# Patient Record
Sex: Female | Born: 1965
Health system: Southern US, Community
[De-identification: ages and names within clinical notes are randomized; demographics above are authoritative.]

## PROBLEM LIST (undated history)

## (undated) DIAGNOSIS — M5126 Other intervertebral disc displacement, lumbar region: Secondary | ICD-10-CM

## (undated) DIAGNOSIS — E785 Hyperlipidemia, unspecified: Secondary | ICD-10-CM

## (undated) DIAGNOSIS — M51369 Other intervertebral disc degeneration, lumbar region without mention of lumbar back pain or lower extremity pain: Secondary | ICD-10-CM

## (undated) DIAGNOSIS — IMO0002 Reserved for concepts with insufficient information to code with codable children: Secondary | ICD-10-CM

## (undated) DIAGNOSIS — L732 Hidradenitis suppurativa: Secondary | ICD-10-CM

## (undated) DIAGNOSIS — M5136 Other intervertebral disc degeneration, lumbar region: Secondary | ICD-10-CM

## (undated) DIAGNOSIS — R87619 Unspecified abnormal cytological findings in specimens from cervix uteri: Secondary | ICD-10-CM

## (undated) DIAGNOSIS — N979 Female infertility, unspecified: Secondary | ICD-10-CM

## (undated) DIAGNOSIS — G43909 Migraine, unspecified, not intractable, without status migrainosus: Secondary | ICD-10-CM

## (undated) HISTORY — DX: Unspecified abnormal cytological findings in specimens from cervix uteri: R87.619

## (undated) HISTORY — DX: Other intervertebral disc displacement, lumbar region: M51.26

## (undated) HISTORY — PX: COLPOSCOPY: SHX161

## (undated) HISTORY — DX: Hyperlipidemia, unspecified: E78.5

## (undated) HISTORY — PX: ANKLE SURGERY: SHX546

## (undated) HISTORY — DX: Reserved for concepts with insufficient information to code with codable children: IMO0002

## (undated) HISTORY — DX: Migraine, unspecified, not intractable, without status migrainosus: G43.909

## (undated) HISTORY — PX: LASIK: SHX215

## (undated) HISTORY — DX: Female infertility, unspecified: N97.9

## (undated) HISTORY — DX: Other intervertebral disc degeneration, lumbar region: M51.36

## (undated) HISTORY — DX: Hidradenitis suppurativa: L73.2

## (undated) HISTORY — DX: Other intervertebral disc degeneration, lumbar region without mention of lumbar back pain or lower extremity pain: M51.369

---

## 1998-05-16 HISTORY — PX: MOLE REMOVAL: SHX2046

## 2000-05-18 ENCOUNTER — Other Ambulatory Visit: Admission: RE | Admit: 2000-05-18 | Discharge: 2000-05-18 | Payer: Self-pay | Admitting: Obstetrics and Gynecology

## 2000-06-12 ENCOUNTER — Encounter (INDEPENDENT_AMBULATORY_CARE_PROVIDER_SITE_OTHER): Payer: Self-pay

## 2000-06-12 ENCOUNTER — Other Ambulatory Visit: Admission: RE | Admit: 2000-06-12 | Discharge: 2000-06-12 | Payer: Self-pay | Admitting: Obstetrics and Gynecology

## 2000-09-08 ENCOUNTER — Other Ambulatory Visit: Admission: RE | Admit: 2000-09-08 | Discharge: 2000-09-08 | Payer: Self-pay | Admitting: Obstetrics and Gynecology

## 2000-12-11 ENCOUNTER — Other Ambulatory Visit: Admission: RE | Admit: 2000-12-11 | Discharge: 2000-12-11 | Payer: Self-pay | Admitting: Obstetrics and Gynecology

## 2001-08-28 ENCOUNTER — Other Ambulatory Visit: Admission: RE | Admit: 2001-08-28 | Discharge: 2001-08-28 | Payer: Self-pay | Admitting: Obstetrics and Gynecology

## 2002-02-19 ENCOUNTER — Other Ambulatory Visit: Admission: RE | Admit: 2002-02-19 | Discharge: 2002-02-19 | Payer: Self-pay | Admitting: Obstetrics and Gynecology

## 2002-08-30 ENCOUNTER — Other Ambulatory Visit: Admission: RE | Admit: 2002-08-30 | Discharge: 2002-08-30 | Payer: Self-pay | Admitting: Obstetrics and Gynecology

## 2004-11-22 ENCOUNTER — Encounter: Admission: RE | Admit: 2004-11-22 | Discharge: 2004-11-30 | Payer: Self-pay | Admitting: Family Medicine

## 2004-11-23 ENCOUNTER — Other Ambulatory Visit: Admission: RE | Admit: 2004-11-23 | Discharge: 2004-11-23 | Payer: Self-pay | Admitting: Obstetrics and Gynecology

## 2005-10-26 ENCOUNTER — Encounter: Admission: RE | Admit: 2005-10-26 | Discharge: 2005-10-26 | Payer: Self-pay | Admitting: Family Medicine

## 2006-07-14 ENCOUNTER — Other Ambulatory Visit: Admission: RE | Admit: 2006-07-14 | Discharge: 2006-07-14 | Payer: Self-pay | Admitting: Obstetrics and Gynecology

## 2007-07-26 ENCOUNTER — Other Ambulatory Visit: Admission: RE | Admit: 2007-07-26 | Discharge: 2007-07-26 | Payer: Self-pay | Admitting: Obstetrics & Gynecology

## 2007-08-16 ENCOUNTER — Encounter: Admission: RE | Admit: 2007-08-16 | Discharge: 2007-08-16 | Payer: Self-pay | Admitting: Obstetrics and Gynecology

## 2008-09-23 ENCOUNTER — Encounter: Admission: RE | Admit: 2008-09-23 | Discharge: 2008-09-23 | Payer: Self-pay | Admitting: Obstetrics and Gynecology

## 2009-03-24 ENCOUNTER — Encounter: Admission: RE | Admit: 2009-03-24 | Discharge: 2009-05-05 | Payer: Self-pay | Admitting: Family Medicine

## 2009-12-01 ENCOUNTER — Encounter: Admission: RE | Admit: 2009-12-01 | Discharge: 2009-12-01 | Payer: Self-pay | Admitting: Obstetrics and Gynecology

## 2010-11-02 ENCOUNTER — Other Ambulatory Visit: Payer: Self-pay | Admitting: Obstetrics and Gynecology

## 2010-11-02 DIAGNOSIS — Z1231 Encounter for screening mammogram for malignant neoplasm of breast: Secondary | ICD-10-CM

## 2010-12-02 ENCOUNTER — Ambulatory Visit
Admission: RE | Admit: 2010-12-02 | Discharge: 2010-12-02 | Disposition: A | Discharge status: Home / Self Care | Payer: BC Managed Care – PPO | Source: Ambulatory Visit | Attending: Obstetrics and Gynecology | Admitting: Obstetrics and Gynecology

## 2010-12-02 DIAGNOSIS — Z1231 Encounter for screening mammogram for malignant neoplasm of breast: Secondary | ICD-10-CM

## 2010-12-03 ENCOUNTER — Ambulatory Visit: Payer: Self-pay

## 2011-12-29 ENCOUNTER — Other Ambulatory Visit: Payer: Self-pay | Admitting: Obstetrics and Gynecology

## 2011-12-29 DIAGNOSIS — Z1231 Encounter for screening mammogram for malignant neoplasm of breast: Secondary | ICD-10-CM

## 2012-01-11 ENCOUNTER — Ambulatory Visit
Admission: RE | Admit: 2012-01-11 | Discharge: 2012-01-11 | Disposition: A | Discharge status: Home / Self Care | Payer: BC Managed Care – PPO | Source: Ambulatory Visit | Attending: Obstetrics and Gynecology | Admitting: Obstetrics and Gynecology

## 2012-01-11 DIAGNOSIS — Z1231 Encounter for screening mammogram for malignant neoplasm of breast: Secondary | ICD-10-CM

## 2012-05-21 HISTORY — PX: CARPAL TUNNEL RELEASE: SHX101

## 2012-12-03 ENCOUNTER — Other Ambulatory Visit: Payer: Self-pay

## 2012-12-03 DIAGNOSIS — Z1231 Encounter for screening mammogram for malignant neoplasm of breast: Secondary | ICD-10-CM

## 2013-01-08 ENCOUNTER — Other Ambulatory Visit: Payer: Self-pay

## 2013-01-08 MED ORDER — NORETHINDRONE 0.35 MG PO TABS: 1.0000 | ORAL_TABLET | Freq: Every day | ORAL | Status: DC

## 2013-01-08 NOTE — Telephone Encounter (Signed)
Patient has AEX scheduled 01/28/13. Refilled until then.

## 2013-01-16 ENCOUNTER — Ambulatory Visit
Admission: RE | Admit: 2013-01-16 | Discharge: 2013-01-16 | Disposition: A | Discharge status: Home / Self Care | Payer: BC Managed Care – PPO | Source: Ambulatory Visit

## 2013-01-16 DIAGNOSIS — Z1231 Encounter for screening mammogram for malignant neoplasm of breast: Secondary | ICD-10-CM

## 2013-01-23 ENCOUNTER — Ambulatory Visit: Payer: Self-pay | Admitting: Obstetrics and Gynecology

## 2013-01-28 ENCOUNTER — Ambulatory Visit (INDEPENDENT_AMBULATORY_CARE_PROVIDER_SITE_OTHER): Payer: BC Managed Care – PPO | Admitting: Obstetrics and Gynecology

## 2013-01-28 ENCOUNTER — Encounter: Payer: Self-pay | Admitting: Obstetrics and Gynecology

## 2013-01-28 VITALS — BP 118/82 | HR 60 | Resp 16 | Ht 64.0 in | Wt 236.8 lb

## 2013-01-28 DIAGNOSIS — Z01419 Encounter for gynecological examination (general) (routine) without abnormal findings: Secondary | ICD-10-CM

## 2013-01-28 DIAGNOSIS — Z8669 Personal history of other diseases of the nervous system and sense organs: Secondary | ICD-10-CM

## 2013-01-28 DIAGNOSIS — Z0000 Encounter for general adult medical examination without abnormal findings: Secondary | ICD-10-CM

## 2013-01-28 LAB — POCT URINALYSIS DIPSTICK
Bilirubin, UA: NEGATIVE
Nitrite, UA: NEGATIVE
Urobilinogen, UA: NEGATIVE
pH, UA: 5

## 2013-01-28 LAB — CBC
HCT: 41.9 % (ref 36.0–46.0)
RDW: 13.9 % (ref 11.5–15.5)
WBC: 10 10*3/uL (ref 4.0–10.5)

## 2013-01-28 LAB — COMPREHENSIVE METABOLIC PANEL
ALT: 17 U/L (ref 0–35)
Albumin: 4.5 g/dL (ref 3.5–5.2)
CO2: 27 mEq/L (ref 19–32)
Glucose, Bld: 89 mg/dL (ref 70–99)
Potassium: 4.1 mEq/L (ref 3.5–5.3)
Sodium: 140 mEq/L (ref 135–145)
Total Bilirubin: 0.4 mg/dL (ref 0.3–1.2)

## 2013-01-28 LAB — LIPID PANEL
HDL: 40 mg/dL (ref >39)
LDL Cholesterol: 116 mg/dL — ABNORMAL HIGH (ref 0–99)
Total CHOL/HDL Ratio: 4.9 Ratio
Triglycerides: 190 mg/dL — ABNORMAL HIGH (ref <150)

## 2013-01-28 MED ORDER — NORETHINDRONE 0.35 MG PO TABS: 1.0000 | ORAL_TABLET | Freq: Every day | ORAL | Status: DC

## 2013-01-28 NOTE — Patient Instructions (Signed)
EXERCISE AND DIET:  We recommended that you start or continue a regular exercise program for good health. Regular exercise means any activity that makes your heart beat faster and makes you sweat.  We recommend exercising at least 30 minutes per day at least 3 days a week, preferably 4 or 5.  We also recommend a diet low in fat and sugar.  Inactivity, poor dietary choices and obesity can cause diabetes, heart attack, stroke, and kidney damage, among others.    ALCOHOL AND SMOKING:  Women should limit their alcohol intake to no more than 7 drinks/beers/glasses of wine (combined, not each!) per week. Moderation of alcohol intake to this level decreases your risk of breast cancer and liver damage. And of course, no recreational drugs are part of a healthy lifestyle.  And absolutely no smoking or even second hand smoke. Most people know smoking can cause heart and lung diseases, but did you know it also contributes to weakening of your bones? Aging of your skin?  Yellowing of your teeth and nails?  CALCIUM AND VITAMIN D:  Adequate intake of calcium and Vitamin D are recommended.  The recommendations for exact amounts of these supplements seem to change often, but generally speaking 600 mg of calcium (either carbonate or citrate) and 800 units of Vitamin D per day seems prudent. Certain women may benefit from higher intake of Vitamin D.  If you are among these women, your doctor will have told you during your visit.    PAP SMEARS:  Pap smears, to check for cervical cancer or precancers,  have traditionally been done yearly, although recent scientific advances have shown that most women can have pap smears less often.  However, every woman still should have a physical exam from her gynecologist every year. It will include a breast check, inspection of the vulva and vagina to check for abnormal growths or skin changes, a visual exam of the cervix, and then an exam to evaluate the size and shape of the uterus and  ovaries.  And after 46 years of age, a rectal exam is indicated to check for rectal cancers. We will also provide age appropriate advice regarding health maintenance, like when you should have certain vaccines, screening for sexually transmitted diseases, bone density testing, colonoscopy, mammograms, etc.   MAMMOGRAMS:  All women over 40 years old should have a yearly mammogram. Many facilities now offer a "3D" mammogram, which may cost around $50 extra out of pocket. If possible,  we recommend you accept the option to have the 3D mammogram performed.  It both reduces the number of women who will be called back for extra views which then turn out to be normal, and it is better than the routine mammogram at detecting truly abnormal areas.    COLONOSCOPY:  Colonoscopy to screen for colon cancer is recommended for all women at age 50.  We know, you hate the idea of the prep.  We agree, BUT, having colon cancer and not knowing it is worse!!  Colon cancer so often starts as a polyp that can be seen and removed at colonscopy, which can quite literally save your life!  And if your first colonoscopy is normal and you have no family history of colon cancer, most women don't have to have it again for 10 years.  Once every ten years, you can do something that may end up saving your life, right?  We will be happy to help you get it scheduled when you are ready.    Be sure to check your insurance coverage so you understand how much it will cost.  It may be covered as a preventative service at no cost, but you should check your particular policy.    Hidradenitis Suppurativa, Sweat Gland Abscess Hidradenitis suppurativa is a long lasting (chronic), uncommon disease of the sweat glands. With this, boil-like lumps and scarring develop in the groin, some times under the arms (axillae), and under the breasts. It may also uncommonly occur behind the ears, in the crease of the buttocks, and around the genitals.  CAUSES  The  cause is from a blocking of the sweat glands. They then become infected. It may cause drainage and odor. It is not contagious. So it cannot be given to someone else. It most often shows up in puberty (about 10 to 46 years of age). But it may happen much later. It is similar to acne which is a disease of the sweat glands. This condition is slightly more common in African-Americans and women. SYMPTOMS   Hidradenitis usually starts as one or more red, tender, swellings in the groin or under the arms (axilla).  Over a period of hours to days the lesions get larger. They often open to the skin surface, draining clear to yellow-colored fluid.  The infected area heals with scarring. DIAGNOSIS  Your caregiver makes this diagnosis by looking at you. Sometimes cultures (growing germs on plates in the lab) may be taken. This is to see what germ (bacterium) is causing the infection.  TREATMENT   Topical germ killing medicine applied to the skin (antibiotics) are the treatment of choice. Antibiotics taken by mouth (systemic) are sometimes needed when the condition is getting worse or is severe.  Avoid tight-fitting clothing which traps moisture in.  Dirt does not cause hidradenitis and it is not caused by poor hygiene.  Involved areas should be cleaned daily using an antibacterial soap. Some patients find that the liquid form of Lever 2000, applied to the involved areas as a lotion after bathing, can help reduce the odor related to this condition.  Sometimes surgery is needed to drain infected areas or remove scarred tissue. Removal of large amounts of tissue is used only in severe cases.  Birth control pills may be helpful.  Oral retinoids (vitamin A derivatives) for 6 to 12 months which are effective for acne may also help this condition.  Weight loss will improve but not cure hidradenitis. It is made worse by being overweight. But the condition is not caused by being overweight.  This condition is  more common in people who have had acne.  It may become worse under stress. There is no medical cure for hidradenitis. It can be controlled, but not cured. The condition usually continues for years with periods of getting worse and getting better (remission). Document Released: 12/15/2003 Document Revised: 07/25/2011 Document Reviewed: 12/31/2007 [employer] Patient Information 2014 [employer].   

## 2013-01-28 NOTE — Progress Notes (Signed)
GYNECOLOGY VISIT  PCP:   Selena Batten, MD  Referring provider:   HPI: 47 y.o.   Married  Caucasian  female   G0P0000 with Patient's last menstrual period was 01/01/2013.   here for  Annual exam.   History of migraine headaches with aura.  Understanding now what triggers are.   Started oral contraceptives for dysmenorrhea years ago.   Satisfied with [employer]. Migraines are improved but still has decreased functionality.   Husband thinks she is having them more often.  Occur once every 2 weeks.  Tramadol causes nausea.  Hydrocodone works well. Feels stressed.  Planning orthoscopic surgery in 03/13/23.    Father deceased in 07-13-22 - multisystem organ failure.  Mother with nonofficial memory issues.  States she is having right shoulder pain . Squeezes under her arm and has pus like drainage.    Hgb:  14.9 Urine:  trace  GYNECOLOGIC HISTORY: Patient's last menstrual period was 01/01/2013. Sexually active:  yes Partner preference:  female Contraception:   Camilla Menopausal hormone therapy:   NA DES exposure:  no Blood transfusions:   no Sexually transmitted diseases:    No STDs.  Two prior yeast infections.  GYN Procedures:   no Mammogram:     01/16/13 - Normal         Pap:   12/10/09 - WNL History of abnormal pap smear:    Yes.  CIN I in 2002 and 2003.  Colpo 2002 - CIN I.  Colpo 2003 WNL.  No treatment.    OB History   Grav Para Term Preterm Abortions TAB SAB Ect Mult Living   0 0 0 0 0 0 0 0 0 0        LIFESTYLE: Exercise:  no     Tobacco:   no Alcohol:  Yes, 1 per month Drug use:  no  OTHER HEALTH MAINTENANCE: Tetanus/TDap:   June,  2006 Gardisil:   NA Influenza:  Fall 2013.  Zostavax:   NA  Bone density:   NA Colonoscopy:   NA  Cholesterol check:  Does with Dr. Uvaldo Rising.  History of elevated cholesterol.     Family History  Problem Relation Age of Onset  . Diabetes Mother   . Hypertension Father   . Hypertension Brother   . Diabetes Brother   . Diabetes  Father   . Heart disease Father     There are no active problems to display for this patient.  Past Medical History  Diagnosis Date  . Migraine     with aura  . Hyperlipidemia   . Abnormal Pap smear     Past Surgical History  Procedure Laterality Date  . Lasik    . Colposcopy      CIN I  . Ankle surgery  1998/1999    left ankle  . Mole removal      off back  . Carpal tunnel release  05/21/12    right wrist    ALLERGIES: Lodine  Current Outpatient Prescriptions  Medication Sig Dispense Refill  . nabumetone (RELAFEN) 500 MG tablet as needed.      . norethindrone (MICRONOR,CAMILA,ERRIN) 0.35 MG tablet Take 1 tablet (0.35 mg total) by mouth daily.  1 Package  0  . traMADol (ULTRAM) 50 MG tablet 50 mg.       No current facility-administered medications for this visit.     ROS:  Pertinent items are noted in HPI.  SOCIAL HISTORY:    PHYSICAL EXAMINATION:    BP 118/82  Pulse 60  Resp 16  Ht 5\' 4"  (1.626 m)  Wt 236 lb 12.8 oz (107.412 kg)  BMI 40.63 kg/m2  LMP 01/01/2013   Wt Readings from Last 3 Encounters:  01/28/13 236 lb 12.8 oz (107.412 kg)     Ht Readings from Last 3 Encounters:  01/28/13 5\' 4"  (1.626 m)    General appearance: alert, cooperative and appears stated age Head: Normocephalic, without obvious abnormality, atraumatic Neck: no adenopathy, supple, symmetrical, trachea midline and thyroid not enlarged, symmetric, no tenderness/mass/nodules Lungs: clear to auscultation bilaterally Breasts: Inspection negative, No nipple retraction or dimpling, No nipple discharge or bleeding, No axillary or supraclavicular adenopathy, Normal to palpation without dominant masses.  Thickening of axillary skin bilaterally. Heart: regular rate and rhythm Abdomen: soft, non-tender; no masses,  no organomegaly Extremities: extremities normal, atraumatic, no cyanosis or edema Skin: Skin color, texture, turgor normal. No rashes or lesions Lymph nodes: Cervical,  supraclavicular, and axillary nodes normal. No abnormal inguinal nodes palpated Neurologic: Grossly normal  Pelvic: External genitalia:  no lesions.  Old scarring of mons and vulva.              Urethra:  normal appearing urethra with no masses, tenderness or lesions              Bartholins and Skenes: normal                 Vagina: normal appearing vagina with normal color and discharge, no lesions              Cervix: normal appearance              Pap and high risk HPV testing done: yes.            Bimanual Exam:  Uterus:  uterus is normal size, shape, consistency and nontender                                      Adnexa: normal adnexa in size, nontender and no masses                                      Rectovaginal: Confirms                                      Anus:  normal sphincter tone, no lesions  ASSESSMENT  Normal gynecologic exam. History of CIN I. History of elevated cholesterol.  Migraine headaches. Probable hidradenitis suppurativa.  PLAN  Mammogram yearly. Pap smear and high risk HPV testing Camilla refill for one year.  Comprehensive metabolic panel, fasting lipid profile, CBC. Patient will follow up with PCP in about 2 weeks for visit.  To discuss migraines headaches with her then. To dermatologist.  Patient has one already. Return annually or prn   An After Visit Summary was printed and given to the patient.

## 2013-01-29 LAB — HEMOGLOBIN, FINGERSTICK: Hemoglobin, fingerstick: 14.9 g/dL (ref 12.0–16.0)

## 2013-01-30 LAB — IPS PAP TEST WITH HPV

## 2013-02-11 ENCOUNTER — Other Ambulatory Visit: Payer: Self-pay | Admitting: Obstetrics and Gynecology

## 2013-02-14 ENCOUNTER — Other Ambulatory Visit: Payer: Self-pay | Admitting: Obstetrics and Gynecology

## 2013-02-14 NOTE — Telephone Encounter (Signed)
Pt calling regarding norethindrone sent to [facility] on [address].

## 2013-03-06 HISTORY — PX: HIP SURGERY: SHX245

## 2013-03-07 DIAGNOSIS — M25852 Other specified joint disorders, left hip: Secondary | ICD-10-CM | POA: Insufficient documentation

## 2013-03-11 ENCOUNTER — Ambulatory Visit: Payer: BC Managed Care – PPO | Attending: Urology | Admitting: Physical Therapy

## 2013-03-11 DIAGNOSIS — M25559 Pain in unspecified hip: Secondary | ICD-10-CM | POA: Insufficient documentation

## 2013-03-11 DIAGNOSIS — R262 Difficulty in walking, not elsewhere classified: Secondary | ICD-10-CM | POA: Insufficient documentation

## 2013-03-11 DIAGNOSIS — IMO0001 Reserved for inherently not codable concepts without codable children: Secondary | ICD-10-CM | POA: Insufficient documentation

## 2013-03-11 DIAGNOSIS — M25669 Stiffness of unspecified knee, not elsewhere classified: Secondary | ICD-10-CM | POA: Insufficient documentation

## 2013-03-12 ENCOUNTER — Ambulatory Visit: Payer: BC Managed Care – PPO | Admitting: Physical Therapy

## 2013-03-13 ENCOUNTER — Ambulatory Visit: Payer: BC Managed Care – PPO | Admitting: Physical Therapy

## 2013-03-14 ENCOUNTER — Ambulatory Visit: Payer: BC Managed Care – PPO | Admitting: Physical Therapy

## 2013-03-15 ENCOUNTER — Ambulatory Visit: Payer: BC Managed Care – PPO | Admitting: Physical Therapy

## 2013-03-18 ENCOUNTER — Ambulatory Visit: Payer: BC Managed Care – PPO | Attending: Urology | Admitting: Physical Therapy

## 2013-03-18 DIAGNOSIS — M25559 Pain in unspecified hip: Secondary | ICD-10-CM | POA: Insufficient documentation

## 2013-03-18 DIAGNOSIS — R262 Difficulty in walking, not elsewhere classified: Secondary | ICD-10-CM | POA: Insufficient documentation

## 2013-03-18 DIAGNOSIS — M25669 Stiffness of unspecified knee, not elsewhere classified: Secondary | ICD-10-CM | POA: Insufficient documentation

## 2013-03-18 DIAGNOSIS — IMO0001 Reserved for inherently not codable concepts without codable children: Secondary | ICD-10-CM | POA: Insufficient documentation

## 2013-03-19 ENCOUNTER — Ambulatory Visit: Payer: BC Managed Care – PPO | Admitting: Physical Therapy

## 2013-03-20 ENCOUNTER — Ambulatory Visit: Payer: BC Managed Care – PPO | Admitting: Physical Therapy

## 2013-03-21 ENCOUNTER — Other Ambulatory Visit: Payer: Self-pay

## 2013-03-21 ENCOUNTER — Ambulatory Visit: Payer: BC Managed Care – PPO | Admitting: Physical Therapy

## 2013-03-22 ENCOUNTER — Ambulatory Visit: Payer: BC Managed Care – PPO | Admitting: Physical Therapy

## 2013-03-25 ENCOUNTER — Ambulatory Visit: Payer: BC Managed Care – PPO | Admitting: Physical Therapy

## 2013-03-26 ENCOUNTER — Ambulatory Visit: Payer: BC Managed Care – PPO | Admitting: Physical Therapy

## 2013-03-27 ENCOUNTER — Ambulatory Visit: Payer: BC Managed Care – PPO | Admitting: Physical Therapy

## 2013-03-28 ENCOUNTER — Ambulatory Visit: Payer: BC Managed Care – PPO | Admitting: Physical Therapy

## 2013-03-29 ENCOUNTER — Ambulatory Visit: Payer: BC Managed Care – PPO | Admitting: Physical Therapy

## 2013-04-01 ENCOUNTER — Ambulatory Visit: Payer: BC Managed Care – PPO | Admitting: Physical Therapy

## 2013-04-02 ENCOUNTER — Encounter: Payer: BC Managed Care – PPO | Admitting: Physical Therapy

## 2013-04-03 ENCOUNTER — Ambulatory Visit: Payer: BC Managed Care – PPO | Admitting: Physical Therapy

## 2013-04-04 ENCOUNTER — Encounter: Payer: BC Managed Care – PPO | Admitting: Physical Therapy

## 2013-04-05 ENCOUNTER — Ambulatory Visit: Payer: BC Managed Care – PPO | Admitting: Physical Therapy

## 2013-04-08 ENCOUNTER — Encounter: Payer: BC Managed Care – PPO | Admitting: Physical Therapy

## 2013-04-09 ENCOUNTER — Ambulatory Visit: Payer: BC Managed Care – PPO | Admitting: Physical Therapy

## 2013-04-10 ENCOUNTER — Ambulatory Visit: Payer: BC Managed Care – PPO | Admitting: Physical Therapy

## 2013-04-15 ENCOUNTER — Ambulatory Visit: Payer: BC Managed Care – PPO | Attending: Urology | Admitting: Physical Therapy

## 2013-04-15 DIAGNOSIS — R262 Difficulty in walking, not elsewhere classified: Secondary | ICD-10-CM | POA: Insufficient documentation

## 2013-04-15 DIAGNOSIS — IMO0001 Reserved for inherently not codable concepts without codable children: Secondary | ICD-10-CM | POA: Insufficient documentation

## 2013-04-15 DIAGNOSIS — M25559 Pain in unspecified hip: Secondary | ICD-10-CM | POA: Insufficient documentation

## 2013-04-15 DIAGNOSIS — M25669 Stiffness of unspecified knee, not elsewhere classified: Secondary | ICD-10-CM | POA: Insufficient documentation

## 2013-04-17 ENCOUNTER — Ambulatory Visit: Payer: BC Managed Care – PPO | Admitting: Physical Therapy

## 2013-04-19 ENCOUNTER — Ambulatory Visit: Payer: BC Managed Care – PPO | Admitting: Physical Therapy

## 2013-04-22 ENCOUNTER — Ambulatory Visit: Payer: BC Managed Care – PPO | Admitting: Physical Therapy

## 2013-04-24 ENCOUNTER — Ambulatory Visit: Payer: BC Managed Care – PPO | Admitting: Physical Therapy

## 2013-04-26 ENCOUNTER — Ambulatory Visit: Payer: BC Managed Care – PPO | Admitting: Physical Therapy

## 2013-04-29 ENCOUNTER — Ambulatory Visit: Payer: BC Managed Care – PPO | Admitting: Physical Therapy

## 2013-05-01 ENCOUNTER — Encounter: Payer: BC Managed Care – PPO | Admitting: Physical Therapy

## 2013-05-03 ENCOUNTER — Ambulatory Visit: Payer: BC Managed Care – PPO | Admitting: Physical Therapy

## 2013-05-06 ENCOUNTER — Ambulatory Visit: Payer: BC Managed Care – PPO | Admitting: Physical Therapy

## 2013-05-08 ENCOUNTER — Encounter: Payer: BC Managed Care – PPO | Admitting: Physical Therapy

## 2013-05-14 ENCOUNTER — Ambulatory Visit: Payer: BC Managed Care – PPO | Admitting: Physical Therapy

## 2013-05-17 ENCOUNTER — Ambulatory Visit: Payer: BC Managed Care – PPO | Attending: Urology | Admitting: Physical Therapy

## 2013-05-17 DIAGNOSIS — IMO0001 Reserved for inherently not codable concepts without codable children: Secondary | ICD-10-CM | POA: Insufficient documentation

## 2013-05-17 DIAGNOSIS — R262 Difficulty in walking, not elsewhere classified: Secondary | ICD-10-CM | POA: Insufficient documentation

## 2013-05-17 DIAGNOSIS — M25559 Pain in unspecified hip: Secondary | ICD-10-CM | POA: Insufficient documentation

## 2013-05-17 DIAGNOSIS — M25669 Stiffness of unspecified knee, not elsewhere classified: Secondary | ICD-10-CM | POA: Insufficient documentation

## 2013-05-22 ENCOUNTER — Ambulatory Visit: Payer: BC Managed Care – PPO | Admitting: Physical Therapy

## 2013-05-24 ENCOUNTER — Ambulatory Visit: Payer: BC Managed Care – PPO | Admitting: Physical Therapy

## 2013-05-28 ENCOUNTER — Encounter: Payer: BC Managed Care – PPO | Admitting: Physical Therapy

## 2013-05-29 ENCOUNTER — Ambulatory Visit: Payer: BC Managed Care – PPO | Admitting: Physical Therapy

## 2013-06-05 ENCOUNTER — Encounter: Payer: BC Managed Care – PPO | Admitting: Physical Therapy

## 2013-06-07 ENCOUNTER — Ambulatory Visit: Payer: BC Managed Care – PPO | Admitting: Physical Therapy

## 2013-06-11 ENCOUNTER — Ambulatory Visit: Payer: BC Managed Care – PPO | Admitting: Physical Therapy

## 2013-06-14 ENCOUNTER — Encounter: Payer: BC Managed Care – PPO | Admitting: Physical Therapy

## 2013-06-18 ENCOUNTER — Ambulatory Visit: Payer: BC Managed Care – PPO | Attending: Urology | Admitting: Physical Therapy

## 2013-06-18 DIAGNOSIS — M25559 Pain in unspecified hip: Secondary | ICD-10-CM | POA: Insufficient documentation

## 2013-06-18 DIAGNOSIS — R262 Difficulty in walking, not elsewhere classified: Secondary | ICD-10-CM | POA: Insufficient documentation

## 2013-06-18 DIAGNOSIS — IMO0001 Reserved for inherently not codable concepts without codable children: Secondary | ICD-10-CM | POA: Insufficient documentation

## 2013-06-18 DIAGNOSIS — M25669 Stiffness of unspecified knee, not elsewhere classified: Secondary | ICD-10-CM | POA: Insufficient documentation

## 2013-12-09 ENCOUNTER — Other Ambulatory Visit: Payer: Self-pay | Admitting: Obstetrics and Gynecology

## 2013-12-09 NOTE — Telephone Encounter (Signed)
Last AEX: 01/28/13 Last refill:02/14/13 #28, 10 rfs Current AEX:02/03/14 Last MMG: 01/16/13 BI-RADS -neg  Pt has enough refills to last until AEX 01/28/13 11 rfs 02/14/13 10 rfs

## 2013-12-23 ENCOUNTER — Other Ambulatory Visit: Payer: Self-pay

## 2013-12-23 DIAGNOSIS — Z1231 Encounter for screening mammogram for malignant neoplasm of breast: Secondary | ICD-10-CM

## 2014-01-07 ENCOUNTER — Encounter: Payer: Self-pay | Admitting: Obstetrics and Gynecology

## 2014-01-22 ENCOUNTER — Ambulatory Visit
Admission: RE | Admit: 2014-01-22 | Discharge: 2014-01-22 | Disposition: A | Discharge status: Home / Self Care | Payer: BC Managed Care – PPO | Source: Ambulatory Visit

## 2014-01-22 DIAGNOSIS — Z1231 Encounter for screening mammogram for malignant neoplasm of breast: Secondary | ICD-10-CM

## 2014-02-03 ENCOUNTER — Ambulatory Visit (INDEPENDENT_AMBULATORY_CARE_PROVIDER_SITE_OTHER): Payer: BC Managed Care – PPO | Admitting: Obstetrics and Gynecology

## 2014-02-03 ENCOUNTER — Ambulatory Visit: Payer: BC Managed Care – PPO | Admitting: Obstetrics and Gynecology

## 2014-02-03 ENCOUNTER — Encounter: Payer: Self-pay | Admitting: Obstetrics and Gynecology

## 2014-02-03 VITALS — BP 120/70 | HR 60 | Resp 16 | Ht 64.0 in | Wt 231.0 lb

## 2014-02-03 DIAGNOSIS — Z0000 Encounter for general adult medical examination without abnormal findings: Secondary | ICD-10-CM

## 2014-02-03 DIAGNOSIS — Z01419 Encounter for gynecological examination (general) (routine) without abnormal findings: Secondary | ICD-10-CM

## 2014-02-03 LAB — POCT URINALYSIS DIPSTICK
BILIRUBIN UA: NEGATIVE
GLUCOSE UA: NEGATIVE
KETONES UA: NEGATIVE
Leukocytes, UA: NEGATIVE
NITRITE UA: NEGATIVE
PROTEIN UA: NEGATIVE
RBC UA: NEGATIVE
Urobilinogen, UA: NEGATIVE
pH, UA: 5

## 2014-02-03 MED ORDER — NORETHINDRONE 0.35 MG PO TABS: 1.0000 | ORAL_TABLET | Freq: Every day | ORAL | Status: DC

## 2014-02-03 NOTE — Progress Notes (Signed)
Patient ID: Kelsey [name], female   DOB: 1965-07-28, 48 y.o.   MRN: [id number] GYNECOLOGY VISIT  PCP:   Gweneth Dimitri, MD  Referring provider:   HPI: 48 y.o.   Married  Caucasian  female   G0P0000 with Patient's last menstrual period was 01/19/2014.   here for  AEX.   Tired of having menses.  Camilla birth control.  Menses last for 5 days.  Had migraine headaches with combined oral contraception. Had painful menses prior to taking OCPs.   Had a torn labrum last year.  Needs to increase exercise.  Has seen dietician.  States she knows that she needs to loose weight.   Hgb:    14.1 Urine:  Neg  GYNECOLOGIC HISTORY: Patient's last menstrual period was 01/19/2014. Sexually active:  yes Partner preference: female Contraception: OCP's--G.Micronor   Menopausal hormone therapy: n/a DES exposure:  no  Blood transfusions:   no Sexually transmitted diseases:  no  GYN procedures and prior surgeries:  no Last mammogram: 01-22-13 fatty breasts, otherwise normal:[facility]                Last pap and high risk HPV testing: 9-9/15 wnl:neg HR HPV   History of abnormal pap smear:  Hx of CIN I in 2002 and 2003.  Hx Cin I and colpo 2002.  Colpo WNL 2003--no treatment.     OB History   Grav Para Term Preterm Abortions TAB SAB Ect Mult Living         LIFESTYLE: Exercise:   no             OTHER HEALTH MAINTENANCE: Tetanus/TDap:  2015 HPV:                  n/a Influenza:          02/2013   Bone density:    n/a Colonoscopy:   n/a  Cholesterol check:  2014 wnl  Family History  Problem Relation Age of Onset  . Diabetes Mother   . Hypertension Father   . Diabetes Father   . Heart disease Father   . Hypertension Brother   . Diabetes Brother     Patient Active Problem List   Diagnosis Date Noted  . History of migraine headaches 01/28/2013   Past Medical History  Diagnosis Date  . Migraine     with aura  . Hyperlipidemia   . Abnormal Pap smear      Past Surgical History  Procedure Laterality Date  . Lasik    . Colposcopy      CIN I  . Ankle surgery  1998/1999    left ankle  . Mole removal      off back  . Carpal tunnel release  05/21/12    right wrist  . Hip surgery Left 03-06-13    --[facility]    ALLERGIES: Lodine  Current Outpatient Prescriptions  Medication Sig Dispense Refill  . doxycycline (VIBRAMYCIN) 100 MG capsule Take 100 mg by mouth 2 (two) times daily.      . nabumetone (RELAFEN) 500 MG tablet as needed.      . norethindrone (MICRONOR,CAMILA,ERRIN) 0.35 MG tablet Take 1 tablet (0.35 mg total) by mouth daily.  1 Package  11  . zolpidem (AMBIEN) 10 MG tablet        No current facility-administered medications for this visit.     ROS:  Pertinent items are noted in HPI.  History  Social History  . Marital Status: Married    Spouse Name: N/A    Number of Children: N/A  . Years of Education: N/A   Occupational History  . Not on file.   Social History Main Topics  . Smoking status: Never Smoker   . Smokeless tobacco: Never Used  . Alcohol Use: Yes     Comment: 1 month  . Drug Use: No  . Sexual Activity: Yes    Partners: Male    Birth Control/ Protection: Pill     Comment: Norethindrone   Other Topics Concern  . Not on file   Social History Narrative  . No narrative on file    PHYSICAL EXAMINATION:    BP 120/70  Pulse 60  Resp 16  Ht  (1.626 m)  Wt 231 lb (104.781 kg)  BMI 39.63 kg/m2  LMP 01/19/2014   Wt Readings from Last 3 Encounters:  02/03/14 231 lb (104.781 kg)  01/28/13 236 lb 12.8 oz (107.412 kg)     Ht Readings from Last 3 Encounters:  02/03/14  (1.626 m)  01/28/13  (1.626 m)    General appearance: alert, cooperative and appears stated age Head: Normocephalic, without obvious abnormality, atraumatic Neck: no adenopathy, supple, symmetrical, trachea midline and thyroid not enlarged, symmetric, no tenderness/mass/nodules Lungs: clear to auscultation  bilaterally Breasts: Inspection negative, No nipple retraction or dimpling, No nipple discharge or bleeding, No axillary or supraclavicular adenopathy, Normal to palpation without dominant masses Heart: regular rate and rhythm Abdomen: obese, soft, non-tender; no masses,  no organomegaly Extremities: extremities normal, atraumatic, no cyanosis or edema Skin: Skin color, texture, turgor normal. No rashes or lesions Lymph nodes: Cervical, supraclavicular, and axillary nodes normal. No abnormal inguinal nodes palpated Neurologic: Grossly normal  Pelvic: External genitalia:  no lesions              Urethra:  normal appearing urethra with no masses, tenderness or lesions              Bartholins and Skenes: normal                 Vagina: normal appearing vagina with normal color and discharge, no lesions              Cervix: normal appearance              Pap and high risk HPV testing done: No.        Bimanual Exam:  Uterus:  uterus is normal size, shape, consistency and nontender                                      Adnexa: normal adnexa in size, nontender and no masses                                      Rectovaginal:  Yes.                                        Confirms above.                                      Anus:  normal sphincter tone, no lesions  ASSESSMENT  Normal gynecologic exam. Remote history of CIN I.   PLAN  Mammogram recommended yearly starting at age 50. Pap smear and high risk HPV testing as above. Counseled on self breast exam, exercise and diet.   Briefly mentioned weight loss surgery options.  See lab orders: Yes.   Refill on Camilla for one year.  Briefly discussed Mirena as an alternative.  Return annually or prn   An After Visit Summary was printed and given to the patient.

## 2014-02-03 NOTE — Patient Instructions (Signed)
EXERCISE AND DIET:  We recommended that you start or continue a regular exercise program for good health. Regular exercise means any activity that makes your heart beat faster and makes you sweat.  We recommend exercising at least 30 minutes per day at least 3 days a week, preferably 4 or 5.  We also recommend a diet low in fat and sugar.  Inactivity, poor dietary choices and obesity can cause diabetes, heart attack, stroke, and kidney damage, among others.    ALCOHOL AND SMOKING:  Women should limit their alcohol intake to no more than 7 drinks/beers/glasses of wine (combined, not each!) per week. Moderation of alcohol intake to this level decreases your risk of breast cancer and liver damage. And of course, no recreational drugs are part of a healthy lifestyle.  And absolutely no smoking or even second hand smoke. Most people know smoking can cause heart and lung diseases, but did you know it also contributes to weakening of your bones? Aging of your skin?  Yellowing of your teeth and nails?  CALCIUM AND VITAMIN D:  Adequate intake of calcium and Vitamin D are recommended.  The recommendations for exact amounts of these supplements seem to change often, but generally speaking 600 mg of calcium (either carbonate or citrate) and 800 units of Vitamin D per day seems prudent. Certain women may benefit from higher intake of Vitamin D.  If you are among these women, your doctor will have told you during your visit.    PAP SMEARS:  Pap smears, to check for cervical cancer or precancers,  have traditionally been done yearly, although recent scientific advances have shown that most women can have pap smears less often.  However, every woman still should have a physical exam from her gynecologist every year. It will include a breast check, inspection of the vulva and vagina to check for abnormal growths or skin changes, a visual exam of the cervix, and then an exam to evaluate the size and shape of the uterus and  ovaries.  And after 47 years of age, a rectal exam is indicated to check for rectal cancers. We will also provide age appropriate advice regarding health maintenance, like when you should have certain vaccines, screening for sexually transmitted diseases, bone density testing, colonoscopy, mammograms, etc.   MAMMOGRAMS:  All women over 40 years old should have a yearly mammogram. Many facilities now offer a "3D" mammogram, which may cost around $50 extra out of pocket. If possible,  we recommend you accept the option to have the 3D mammogram performed.  It both reduces the number of women who will be called back for extra views which then turn out to be normal, and it is better than the routine mammogram at detecting truly abnormal areas.    COLONOSCOPY:  Colonoscopy to screen for colon cancer is recommended for all women at age 50.  We know, you hate the idea of the prep.  We agree, BUT, having colon cancer and not knowing it is worse!!  Colon cancer so often starts as a polyp that can be seen and removed at colonscopy, which can quite literally save your life!  And if your first colonoscopy is normal and you have no family history of colon cancer, most women don't have to have it again for 10 years.  Once every ten years, you can do something that may end up saving your life, right?  We will be happy to help you get it scheduled when you are ready.    Be sure to check your insurance coverage so you understand how much it will cost.  It may be covered as a preventative service at no cost, but you should check your particular policy.     Levonorgestrel intrauterine device (IUD) What is this medicine? LEVONORGESTREL IUD (LEE voe nor jes trel) is a contraceptive (birth control) device. The device is placed inside the uterus by a healthcare professional. It is used to prevent pregnancy and can also be used to treat heavy bleeding that occurs during your period. Depending on the device, it can be used for 3 to 5  years. This medicine may be used for other purposes; ask your health care provider or pharmacist if you have questions. COMMON BRAND NAME(S): LILETTA, Mirena, Skyla What should I tell my health care provider before I take this medicine? They need to know if you have any of these conditions: -abnormal Pap smear -cancer of the breast, uterus, or cervix -diabetes -endometritis -genital or pelvic infection now or in the past -have more than one sexual partner or your partner has more than one partner -heart disease -history of an ectopic or tubal pregnancy -immune system problems -IUD in place -liver disease or tumor -problems with blood clots or take blood-thinners -use intravenous drugs -uterus of unusual shape -vaginal bleeding that has not been explained -an unusual or allergic reaction to levonorgestrel, other hormones, silicone, or polyethylene, medicines, foods, dyes, or preservatives -pregnant or trying to get pregnant -breast-feeding How should I use this medicine? This device is placed inside the uterus by a health care professional. Talk to your pediatrician regarding the use of this medicine in children. Special care may be needed. Overdosage: If you think you have taken too much of this medicine contact a poison control center or emergency room at once. NOTE: This medicine is only for you. Do not share this medicine with others. What if I miss a dose? This does not apply. What may interact with this medicine? Do not take this medicine with any of the following medications: -amprenavir -bosentan -fosamprenavir This medicine may also interact with the following medications: -aprepitant -barbiturate medicines for inducing sleep or treating seizures -bexarotene -griseofulvin -medicines to treat seizures like carbamazepine, ethotoin, felbamate, oxcarbazepine, phenytoin, topiramate -modafinil -pioglitazone -rifabutin -rifampin -rifapentine -some medicines to treat HIV  infection like atazanavir, indinavir, lopinavir, nelfinavir, tipranavir, ritonavir -St. John's wort -warfarin This list may not describe all possible interactions. Give your health care provider a list of all the medicines, herbs, non-prescription drugs, or dietary supplements you use. Also tell them if you smoke, drink alcohol, or use illegal drugs. Some items may interact with your medicine. What should I watch for while using this medicine? Visit your doctor or health care professional for regular check ups. See your doctor if you or your partner has sexual contact with others, becomes HIV positive, or gets a sexual transmitted disease. This product does not protect you against HIV infection (AIDS) or other sexually transmitted diseases. You can check the placement of the IUD yourself by reaching up to the top of your vagina with clean fingers to feel the threads. Do not pull on the threads. It is a good habit to check placement after each menstrual period. Call your doctor right away if you feel more of the IUD than just the threads or if you cannot feel the threads at all. The IUD may come out by itself. You may become pregnant if the device comes out. If you notice that the IUD   has come out use a backup birth control method like condoms and call your health care provider. Using tampons will not change the position of the IUD and are okay to use during your period. What side effects may I notice from receiving this medicine? Side effects that you should report to your doctor or health care professional as soon as possible: -allergic reactions like skin rash, itching or hives, swelling of the face, lips, or tongue -fever, flu-like symptoms -genital sores -high blood pressure -no menstrual period for 6 weeks during use -pain, swelling, warmth in the leg -pelvic pain or tenderness -severe or sudden headache -signs of pregnancy -stomach cramping -sudden shortness of breath -trouble with  balance, talking, or walking -unusual vaginal bleeding, discharge -yellowing of the eyes or skin Side effects that usually do not require medical attention (report to your doctor or health care professional if they continue or are bothersome): -acne -breast pain -change in sex drive or performance -changes in weight -cramping, dizziness, or faintness while the device is being inserted -headache -irregular menstrual bleeding within first 3 to 6 months of use -nausea This list may not describe all possible side effects. Call your doctor for medical advice about side effects. You may report side effects to FDA at 1-800-FDA-1088. Where should I keep my medicine? This does not apply. NOTE: This sheet is a summary. It may not cover all possible information. If you have questions about this medicine, talk to your doctor, pharmacist, or health care provider.  2015, [employer]vier/Gold Standard. (2011-06-02 13:54:04)  

## 2014-02-04 LAB — CBC
HCT: 40.4 % (ref 36.0–46.0)
Hemoglobin: 14 g/dL (ref 12.0–15.0)
MCH: 29 pg (ref 26.0–34.0)
MCHC: 34.7 g/dL (ref 30.0–36.0)
MCV: 83.6 fL (ref 78.0–100.0)
PLATELETS: 325 10*3/uL (ref 150–400)
RBC: 4.83 MIL/uL (ref 3.87–5.11)
RDW: 13.8 % (ref 11.5–15.5)
WBC: 10 10*3/uL (ref 4.0–10.5)

## 2014-02-04 LAB — COMPREHENSIVE METABOLIC PANEL
ALK PHOS: 71 U/L (ref 39–117)
ALT: 17 U/L (ref 0–35)
AST: 19 U/L (ref 0–37)
Albumin: 4.3 g/dL (ref 3.5–5.2)
BUN: 11 mg/dL (ref 6–23)
CHLORIDE: 104 meq/L (ref 96–112)
CO2: 22 meq/L (ref 19–32)
Calcium: 9.4 mg/dL (ref 8.4–10.5)
Creat: 0.8 mg/dL (ref 0.50–1.10)
Glucose, Bld: 77 mg/dL (ref 70–99)
POTASSIUM: 3.9 meq/L (ref 3.5–5.3)
SODIUM: 139 meq/L (ref 135–145)
Total Bilirubin: 0.5 mg/dL (ref 0.2–1.2)
Total Protein: 6.9 g/dL (ref 6.0–8.3)

## 2014-02-04 LAB — LIPID PANEL
CHOLESTEROL: 189 mg/dL (ref 0–200)
HDL: 47 mg/dL (ref >39)
LDL CALC: 106 mg/dL — AB (ref 0–99)
Total CHOL/HDL Ratio: 4 Ratio
Triglycerides: 179 mg/dL — ABNORMAL HIGH (ref <150)
VLDL: 36 mg/dL (ref 0–40)

## 2014-02-04 LAB — TSH: TSH: 1.864 u[IU]/mL (ref 0.350–4.500)

## 2014-02-04 LAB — HEMOGLOBIN, FINGERSTICK: Hemoglobin, fingerstick: 14.1 g/dL (ref 12.0–16.0)

## 2014-02-05 ENCOUNTER — Ambulatory Visit: Payer: BC Managed Care – PPO | Admitting: Obstetrics and Gynecology

## 2014-04-07 ENCOUNTER — Other Ambulatory Visit: Payer: Self-pay

## 2014-04-07 NOTE — Telephone Encounter (Signed)
[facility] request refill for Kelsey [name]. Pt was given new rx on 02/03/14 #1 pack X 11. Pt will call if refill is needed.  Encounter closed

## 2014-04-09 ENCOUNTER — Other Ambulatory Visit: Payer: Self-pay

## 2014-04-09 MED ORDER — NORETHINDRONE 0.35 MG PO TABS: 1.0000 | ORAL_TABLET | Freq: Every day | ORAL | Status: DC

## 2014-04-09 NOTE — Telephone Encounter (Signed)
Next AEX is 02/10/15.  90 day RF done with 4 RF.

## 2014-04-09 NOTE — Telephone Encounter (Signed)
Incoming Refill Request from [facility] KG:[id number]:Kelsey [name]  Last AEX: 02/03/14 Last Refill:02/03/14 #1 X  Next AEX: 02/09/14  (Dr. Edward JollySilva pt)  Pharmacy is requesting a 90 day supply. Is this ok  Please Advise

## 2014-04-16 ENCOUNTER — Telehealth: Payer: Self-pay | Admitting: Gynecology

## 2014-04-16 NOTE — Telephone Encounter (Signed)
Left message regarding upcoming appointment has been canceled and needs to be rescheduled. °

## 2014-12-24 ENCOUNTER — Other Ambulatory Visit: Payer: Self-pay

## 2014-12-24 DIAGNOSIS — Z1231 Encounter for screening mammogram for malignant neoplasm of breast: Secondary | ICD-10-CM

## 2015-01-27 ENCOUNTER — Ambulatory Visit
Admission: RE | Admit: 2015-01-27 | Discharge: 2015-01-27 | Disposition: A | Discharge status: Home / Self Care | Payer: BLUE CROSS/BLUE SHIELD | Source: Ambulatory Visit

## 2015-01-27 DIAGNOSIS — Z1231 Encounter for screening mammogram for malignant neoplasm of breast: Secondary | ICD-10-CM

## 2015-02-03 ENCOUNTER — Encounter: Payer: Self-pay | Admitting: Nurse Practitioner

## 2015-02-10 ENCOUNTER — Ambulatory Visit (INDEPENDENT_AMBULATORY_CARE_PROVIDER_SITE_OTHER): Payer: BLUE CROSS/BLUE SHIELD | Admitting: Nurse Practitioner

## 2015-02-10 ENCOUNTER — Encounter: Payer: Self-pay | Admitting: Nurse Practitioner

## 2015-02-10 ENCOUNTER — Ambulatory Visit: Payer: BC Managed Care – PPO | Admitting: Gynecology

## 2015-02-10 VITALS — BP 114/66 | HR 64 | Ht 64.25 in | Wt 241.0 lb

## 2015-02-10 DIAGNOSIS — Z0000 Encounter for general adult medical examination without abnormal findings: Secondary | ICD-10-CM | POA: Diagnosis not present

## 2015-02-10 DIAGNOSIS — Z800 Family history of malignant neoplasm of digestive organs: Secondary | ICD-10-CM

## 2015-02-10 DIAGNOSIS — Z23 Encounter for immunization: Secondary | ICD-10-CM | POA: Diagnosis not present

## 2015-02-10 DIAGNOSIS — Z01419 Encounter for gynecological examination (general) (routine) without abnormal findings: Secondary | ICD-10-CM | POA: Diagnosis not present

## 2015-02-10 LAB — COMPREHENSIVE METABOLIC PANEL
ALBUMIN: 4.1 g/dL (ref 3.6–5.1)
ALT: 22 U/L (ref 6–29)
AST: 22 U/L (ref 10–35)
Alkaline Phosphatase: 71 U/L (ref 33–115)
BILIRUBIN TOTAL: 0.4 mg/dL (ref 0.2–1.2)
BUN: 9 mg/dL (ref 7–25)
CHLORIDE: 102 mmol/L (ref 98–110)
CO2: 23 mmol/L (ref 20–31)
CREATININE: 0.8 mg/dL (ref 0.50–1.10)
Calcium: 9.4 mg/dL (ref 8.6–10.2)
Glucose, Bld: 81 mg/dL (ref 65–99)
Potassium: 4.2 mmol/L (ref 3.5–5.3)
SODIUM: 139 mmol/L (ref 135–146)
TOTAL PROTEIN: 6.8 g/dL (ref 6.1–8.1)

## 2015-02-10 LAB — TSH: TSH: 1.83 u[IU]/mL (ref 0.350–4.500)

## 2015-02-10 LAB — POCT URINALYSIS DIPSTICK
BILIRUBIN UA: NEGATIVE
Blood, UA: NEGATIVE
GLUCOSE UA: NEGATIVE
Ketones, UA: NEGATIVE
LEUKOCYTES UA: NEGATIVE
NITRITE UA: NEGATIVE
PH UA: 6
Protein, UA: NEGATIVE
Urobilinogen, UA: NEGATIVE

## 2015-02-10 LAB — LIPID PANEL
CHOLESTEROL: 84 mg/dL — AB (ref 125–200)
HDL: 39 mg/dL — ABNORMAL LOW (ref >=46)
LDL Cholesterol: 6 mg/dL (ref <130)
TRIGLYCERIDES: 194 mg/dL — AB (ref <150)
Total CHOL/HDL Ratio: 2.2 Ratio (ref <=5.0)
VLDL: 39 mg/dL — ABNORMAL HIGH (ref <30)

## 2015-02-10 LAB — HEMOGLOBIN, FINGERSTICK: Hemoglobin, fingerstick: 14 g/dL (ref 12.0–16.0)

## 2015-02-10 MED ORDER — NORETHINDRONE 0.35 MG PO TABS: 1.0000 | ORAL_TABLET | Freq: Every day | ORAL | Status: DC

## 2015-02-10 NOTE — Patient Instructions (Signed)

## 2015-02-10 NOTE — Progress Notes (Signed)
Patient ID: Kelsey [name], female   DOB: Apr 24, 1966, 49 y.o.   MRN: [id number] 49 y.o. G0P0000 Married  Caucasian Fe here for annual exam.  Menses last 5 days. moderate to light.    Patient's last menstrual period was 01/25/2015 (exact date).          Sexually active: Yes.    The current method of family planning is oral progesterone-only contraceptive.    Exercising: No.  The patient does not participate in regular exercise at present. Smoker:  no  Health Maintenance: Pap:  01/28/13, Negative with neg HR HPV History of abnormal pap smear: Hx of CIN I in 2002 and 2003. Hx CIN I and Colpo 2002. Colpo WNL 2003--no treatment MMG:  01/27/15, Bi-Rads 1: Negative  TDaP:  Will update at 9/27 - today Labs:  HB:  14.0    Urine:  Negative    reports that she has never smoked. She has never used smokeless tobacco. She reports that she drinks alcohol. She reports that she does not use illicit drugs.  Past Medical History  Diagnosis Date  . Migraine     with aura  . Hyperlipidemia   . Abnormal Pap smear   . Infertility, female     no method of birth from late 20's through the 60's,  Headaches on OCP    Past Surgical History  Procedure Laterality Date  . Lasik    . Colposcopy      CIN I  . Ankle surgery  1998/1999    left ankle fracture with ORIF  . Mole removal  2000    off back - benign  . Carpal tunnel release  05/21/12    right wrist  . Hip surgery Left 03-06-13    --[facility] - Torn Labrum from injury    Current Outpatient Prescriptions  Medication Sig Dispense Refill  . amitriptyline (ELAVIL) 10 MG tablet Take 1 tablet by mouth at bedtime.  3  . doxycycline (VIBRAMYCIN) 100 MG capsule Take 100 mg by mouth 2 (two) times daily.    Marland Kitchen isometheptene-acetaminophen-dichloralphenazone (MIDRIN) 65-100-325 MG capsule Take 1 capsule by mouth as needed. For headache.  May repeat 2 hours later if needed.  *Max 5 per day*  1  . norethindrone (MICRONOR,CAMILA,ERRIN) 0.35 MG tablet Take 1  tablet (0.35 mg total) by mouth daily. 3 Package 3   No current facility-administered medications for this visit.    Family History  Problem Relation Age of Onset  . Diabetes Mother   . Hypertension Father   . Diabetes Father   . Heart disease Father   . Heart failure Father   . Hypertension Brother   . Colon cancer Brother 49    Colectomy with Appendectomy  . Diabetes Brother   . Alcohol abuse Brother     ROS:  Pertinent items are noted in HPI.  Otherwise, a comprehensive ROS was negative.  Exam:   BP 114/66 mmHg  Pulse 64  Ht 5' 4.25" (1.632 m)  Wt 241 lb (109.317 kg)  BMI 41.04 kg/m2  LMP 01/25/2015 (Exact Date) Height: 5' 4.25" (163.2 cm) Ht Readings from Last 3 Encounters:  02/10/15 5' 4.25" (1.632 m)  02/03/14  (1.626 m)  01/28/13  (1.626 m)    General appearance: alert, cooperative and appears stated age Head: Normocephalic, without obvious abnormality, atraumatic Neck: no adenopathy, supple, symmetrical, trachea midline and thyroid normal to inspection and palpation Lungs: clear to auscultation bilaterally Breasts: normal appearance, no masses or tenderness Heart:  regular rate and rhythm Abdomen: soft, non-tender; no masses,  no organomegaly Extremities: extremities normal, atraumatic, no cyanosis or edema Skin: Skin color, texture, turgor normal. No rashes or lesions Lymph nodes: Cervical, supraclavicular, and axillary nodes normal. No abnormal inguinal nodes palpated Neurologic: Grossly normal   Pelvic: External genitalia:  no lesions              Urethra:  normal appearing urethra with no masses, tenderness or lesions              Bartholin's and Skene's: normal                 Vagina: normal appearing vagina with normal color and discharge, no lesions              Cervix: anteverted              Pap taken: No. Bimanual Exam:  Uterus:  normal size, contour, position, consistency, mobility, non-tender              Adnexa: no mass, fullness,  tenderness               Rectovaginal: Confirms               Anus:  normal sphincter tone, no lesions  Chaperone present: no  A:  Well Woman with normal exam  History of infertility  History of dysmenorrhea - better since on POP  Headaches on OCP  Remote history of CIN I - 2002/ 2003  Brother with new diagnosis of colon cancer  Update immunization    P:   Reviewed health and wellness pertinent to exam  Pap smear as above  Mammogram is due 01/2016  Refill on POP for a year  Will follow up with labs.  Will get referral to GI for screening colonoscopy  Update TDaP today  Counseled on breast self exam, mammography screening, use and side effects of POP's, adequate intake of calcium and vitamin D, diet and exercise return annually or prn  An After Visit Summary was printed and given to the patient.

## 2015-02-11 ENCOUNTER — Other Ambulatory Visit: Payer: Self-pay | Admitting: Nurse Practitioner

## 2015-02-11 DIAGNOSIS — E559 Vitamin D deficiency, unspecified: Secondary | ICD-10-CM

## 2015-02-11 LAB — VITAMIN D 25 HYDROXY (VIT D DEFICIENCY, FRACTURES): VIT D 25 HYDROXY: 15 ng/mL — AB (ref 30–100)

## 2015-02-12 NOTE — Progress Notes (Signed)
Encounter reviewed by Dr. Brook Amundson C. Silva.  

## 2015-03-03 ENCOUNTER — Telehealth: Payer: Self-pay | Admitting: Nurse Practitioner

## 2015-03-03 NOTE — Telephone Encounter (Signed)
Received appointment fax from dr Kenna Gilbertmann's office with appointment. Attempted call to patient with information. Rang multiple times and disconnected to a busy signal. Will attempt again.

## 2015-03-24 ENCOUNTER — Other Ambulatory Visit: Payer: Self-pay | Admitting: Obstetrics & Gynecology

## 2015-04-04 ENCOUNTER — Other Ambulatory Visit: Payer: Self-pay | Admitting: Obstetrics & Gynecology

## 2015-04-06 NOTE — Telephone Encounter (Signed)
Medication refill request: Kelsey [name] Last AEX:  02-10-2015 Next AEX: 02-17-2016 Last MMG (if hormonal medication request): 01-27-2015 BIRADS 1 Refill authorized: Kelsey [name] #[id number] Refills 3 02-10-2015

## 2015-12-28 ENCOUNTER — Other Ambulatory Visit: Payer: Self-pay | Admitting: Family Medicine

## 2015-12-28 DIAGNOSIS — Z1231 Encounter for screening mammogram for malignant neoplasm of breast: Secondary | ICD-10-CM

## 2016-01-29 ENCOUNTER — Ambulatory Visit
Admission: RE | Admit: 2016-01-29 | Discharge: 2016-01-29 | Disposition: A | Discharge status: Home / Self Care | Payer: BLUE CROSS/BLUE SHIELD | Source: Ambulatory Visit | Attending: Family Medicine | Admitting: Family Medicine

## 2016-01-29 DIAGNOSIS — Z1231 Encounter for screening mammogram for malignant neoplasm of breast: Secondary | ICD-10-CM | POA: Diagnosis not present

## 2016-02-03 ENCOUNTER — Other Ambulatory Visit: Payer: Self-pay | Admitting: Family Medicine

## 2016-02-03 DIAGNOSIS — R928 Other abnormal and inconclusive findings on diagnostic imaging of breast: Secondary | ICD-10-CM

## 2016-02-17 ENCOUNTER — Ambulatory Visit
Admission: RE | Admit: 2016-02-17 | Discharge: 2016-02-17 | Disposition: A | Discharge status: Home / Self Care | Payer: BLUE CROSS/BLUE SHIELD | Source: Ambulatory Visit | Attending: Family Medicine | Admitting: Family Medicine

## 2016-02-17 ENCOUNTER — Encounter: Payer: Self-pay | Admitting: Nurse Practitioner

## 2016-02-17 ENCOUNTER — Ambulatory Visit (INDEPENDENT_AMBULATORY_CARE_PROVIDER_SITE_OTHER): Payer: BLUE CROSS/BLUE SHIELD | Admitting: Nurse Practitioner

## 2016-02-17 VITALS — BP 118/76 | HR 64 | Ht 64.0 in | Wt 232.0 lb

## 2016-02-17 DIAGNOSIS — N6489 Other specified disorders of breast: Secondary | ICD-10-CM | POA: Diagnosis not present

## 2016-02-17 DIAGNOSIS — R928 Other abnormal and inconclusive findings on diagnostic imaging of breast: Secondary | ICD-10-CM

## 2016-02-17 DIAGNOSIS — E559 Vitamin D deficiency, unspecified: Secondary | ICD-10-CM

## 2016-02-17 DIAGNOSIS — Z01419 Encounter for gynecological examination (general) (routine) without abnormal findings: Secondary | ICD-10-CM | POA: Diagnosis not present

## 2016-02-17 DIAGNOSIS — Z0000 Encounter for general adult medical examination without abnormal findings: Secondary | ICD-10-CM | POA: Diagnosis not present

## 2016-02-17 DIAGNOSIS — N6320 Unspecified lump in the left breast, unspecified quadrant: Secondary | ICD-10-CM | POA: Diagnosis not present

## 2016-02-17 LAB — COMPREHENSIVE METABOLIC PANEL
ALBUMIN: 4.4 g/dL (ref 3.6–5.1)
ALT: 22 U/L (ref 6–29)
AST: 23 U/L (ref 10–35)
Alkaline Phosphatase: 67 U/L (ref 33–115)
BUN: 14 mg/dL (ref 7–25)
CHLORIDE: 104 mmol/L (ref 98–110)
CO2: 27 mmol/L (ref 20–31)
CREATININE: 0.84 mg/dL (ref 0.50–1.10)
Calcium: 9.3 mg/dL (ref 8.6–10.2)
GLUCOSE: 84 mg/dL (ref 65–99)
Potassium: 4 mmol/L (ref 3.5–5.3)
SODIUM: 140 mmol/L (ref 135–146)
Total Bilirubin: 0.5 mg/dL (ref 0.2–1.2)
Total Protein: 6.8 g/dL (ref 6.1–8.1)

## 2016-02-17 LAB — LIPID PANEL
CHOLESTEROL: 196 mg/dL (ref 125–200)
HDL: 37 mg/dL — ABNORMAL LOW (ref >=46)
LDL Cholesterol: 125 mg/dL (ref <130)
Total CHOL/HDL Ratio: 5.3 Ratio — ABNORMAL HIGH (ref <=5.0)
Triglycerides: 172 mg/dL — ABNORMAL HIGH (ref <150)
VLDL: 34 mg/dL — ABNORMAL HIGH (ref <30)

## 2016-02-17 LAB — CBC
HEMATOCRIT: 42.3 % (ref 35.0–45.0)
Hemoglobin: 14.7 g/dL (ref 11.7–15.5)
MCH: 29.8 pg (ref 27.0–33.0)
MCHC: 34.8 g/dL (ref 32.0–36.0)
MCV: 85.6 fL (ref 80.0–100.0)
MPV: 8.8 fL (ref 7.5–12.5)
Platelets: 327 10*3/uL (ref 140–400)
RBC: 4.94 MIL/uL (ref 3.80–5.10)
RDW: 13.1 % (ref 11.0–15.0)
WBC: 8.5 10*3/uL (ref 3.8–10.8)

## 2016-02-17 LAB — TSH: TSH: 1.64 m[IU]/L

## 2016-02-17 MED ORDER — NORETHINDRONE 0.35 MG PO TABS: 1.0000 | ORAL_TABLET | Freq: Every day | ORAL | 4 refills | Status: DC

## 2016-02-17 NOTE — Patient Instructions (Signed)

## 2016-02-17 NOTE — Progress Notes (Signed)
Patient ID: Kelsey [name], female   DOB: 1965-12-05, 50 y.o.   MRN: [id number]  50 y.o. G0P0000 Married  Caucasian Fe here for annual exam. No new health problems.  Menses is regular and last 5 days.  Flow is moderate to light.  Some cramps.  No vaso symptoms.    She is having a repeat diagnostic Mammo today at 10:00.  She feels no mass in the left breast. Some increase in stressors with a new position at work and mother was diagnosed with Alzheimer's.  Patient's last menstrual period was 02/01/2016.          Sexually active: Yes.    The current method of family planning is oral progesterone-only contraceptive.    Exercising: No.  The patient does not participate in regular exercise at present. Smoker:  no  Health Maintenance: Pap:  01/28/13, Negative with neg HR HPV History of abnormal pap smear: Hx of CIN I in 2002 and 2003. Hx CIN I and Colpo 2002. Colpo WNL 2003--no treatment MMG: 01/29/16, incomplete, diagnostic and ultrasound today Colonoscopy: 04/27/15, tubular adenoma, repeat in 5 years TDaP: 02/10/15 HIV: 2007 Labs: done today   reports that she has never smoked. She has never used smokeless tobacco. She reports that she drinks alcohol. She reports that she does not use drugs.  Past Medical History:  Diagnosis Date  . Abnormal Pap smear   . Hyperlipidemia   . Infertility, female    no method of birth from late 20's through the 7340's,  Headaches on OCP  . Migraine    with aura    Past Surgical History:  Procedure Laterality Date  . ANKLE SURGERY  1998/1999   left ankle fracture with ORIF  . CARPAL TUNNEL RELEASE  05/21/12   right wrist  . COLPOSCOPY     CIN I  . HIP SURGERY Left 03-06-13   --[facility] - Torn Labrum from injury  . LASIK    . MOLE REMOVAL  2000   off back - benign    Current Outpatient Prescriptions  Medication Sig Dispense Refill  . amitriptyline (ELAVIL) 10 MG tablet Take 1 tablet by mouth at bedtime.  3  . doxycycline (VIBRAMYCIN) 100 MG capsule  Take 100 mg by mouth 2 (two) times daily.    Marland Kitchen. isometheptene-acetaminophen-dichloralphenazone (MIDRIN) 65-100-325 MG capsule Take 1 capsule by mouth as needed. For headache.  May repeat 2 hours later if needed.  *Max 5 per day*  1  . norethindrone (MICRONOR,CAMILA,ERRIN) 0.35 MG tablet Take 1 tablet (0.35 mg total) by mouth daily. 3 Package 4   No current facility-administered medications for this visit.     Family History  Problem Relation Age of Onset  . Diabetes Mother   . Alzheimer's disease Mother   . Hypertension Father   . Diabetes Father   . Heart disease Father   . Heart failure Father   . Hypertension Brother   . Colon cancer Brother [id number]    Colectomy with Appendectomy  . Diabetes Brother   . Alcohol abuse Brother   . Alzheimer's disease Maternal Grandmother   . Alzheimer's disease Maternal Aunt     ROS:  Pertinent items are noted in HPI.  Otherwise, a comprehensive ROS was negative.  Exam:   BP 118/76 (BP Location: Right Arm, Patient Position: Sitting, Cuff Size: Large)   Pulse 64   Ht 5\' 4"  (1.626 m)   Wt 232 lb (105.2 kg)   LMP 02/01/2016   BMI 39.82 kg/m  Height: 5\' 4"  (162.6 cm) Ht Readings from Last 3 Encounters:  02/17/16 5\' 4"  (1.626 m)  02/10/15 5' 4.25" (1.632 m)  02/03/14 5\' 4"  (1.626 m)    General appearance: alert, cooperative and appears stated age Head: Normocephalic, without obvious abnormality, atraumatic Neck: no adenopathy, supple, symmetrical, trachea midline and thyroid normal to inspection and palpation Lungs: clear to auscultation bilaterally Breasts: normal appearance, no masses or tenderness Heart: regular rate and rhythm Abdomen: soft, non-tender; no masses,  no organomegaly Extremities: extremities normal, atraumatic, no cyanosis or edema Skin: Skin color, texture, turgor normal. No rashes or lesions Lymph nodes: Cervical, supraclavicular, and axillary nodes normal. No abnormal inguinal nodes palpated Neurologic: Grossly  normal   Pelvic: External genitalia:  no lesions              Urethra:  normal appearing urethra with no masses, tenderness or lesions              Bartholin's and Skene's: normal                 Vagina: normal appearing vagina with normal color and discharge, no lesions              Cervix: anteverted              Pap taken: Yes.   Bimanual Exam:  Uterus:  normal size, contour, position, consistency, mobility, non-tender              Adnexa: no mass, fullness, tenderness               Rectovaginal: Confirms               Anus:  normal sphincter tone, no lesions  Chaperone present: yes  A:  Well Woman with normal exam  History of infertility             History of dysmenorrhea - better since on POP             Headaches on OCP             Remote history of CIN I - 2002/ 2003             Brother with new diagnosis of colon cancer   P:   Reviewed health and wellness pertinent to exam  Pap smear was done  Mammogram today   Refill on POP for a year  Counseled on breast self exam, mammography screening, use and side effects of POP's, adequate intake of calcium and vitamin D, diet and exercise return annually or prn  An After Visit Summary was printed and given to the patient.

## 2016-02-18 LAB — VITAMIN D 25 HYDROXY (VIT D DEFICIENCY, FRACTURES): VIT D 25 HYDROXY: 24 ng/mL — AB (ref 30–100)

## 2016-02-19 ENCOUNTER — Other Ambulatory Visit: Payer: Self-pay | Admitting: Nurse Practitioner

## 2016-02-19 LAB — IPS PAP TEST WITH HPV

## 2016-02-19 MED ORDER — VITAMIN D (ERGOCALCIFEROL) 1.25 MG (50000 UNIT) PO CAPS: 50000.0000 [IU] | ORAL_CAPSULE | ORAL | 2 refills | Status: DC

## 2016-02-19 NOTE — Progress Notes (Signed)
Encounter reviewed by Dr. Brook Amundson C. Silva.  

## 2016-03-07 ENCOUNTER — Other Ambulatory Visit: Payer: Self-pay | Admitting: Nurse Practitioner

## 2016-03-07 NOTE — Telephone Encounter (Signed)
Medication refill request: CAMILA 0.35mg  Last AEX:  02/17/16 PG Next AEX: 02/24/17 Last MMG (if hormonal medication request): 02/17/16 BIRADS 2 benign; patient had LEFT UNILATERAL US as well Refill authorized: 02/17/16 #3 w/4 refills; today please advise

## 2016-06-06 DIAGNOSIS — E785 Hyperlipidemia, unspecified: Secondary | ICD-10-CM | POA: Diagnosis not present

## 2016-06-06 DIAGNOSIS — G43109 Migraine with aura, not intractable, without status migrainosus: Secondary | ICD-10-CM | POA: Diagnosis not present

## 2016-06-06 DIAGNOSIS — L732 Hidradenitis suppurativa: Secondary | ICD-10-CM | POA: Diagnosis not present

## 2016-06-06 DIAGNOSIS — Z0000 Encounter for general adult medical examination without abnormal findings: Secondary | ICD-10-CM | POA: Diagnosis not present

## 2016-08-22 DIAGNOSIS — L02426 Furuncle of left lower limb: Secondary | ICD-10-CM | POA: Diagnosis not present

## 2016-08-22 DIAGNOSIS — L821 Other seborrheic keratosis: Secondary | ICD-10-CM | POA: Diagnosis not present

## 2016-08-22 DIAGNOSIS — L738 Other specified follicular disorders: Secondary | ICD-10-CM | POA: Diagnosis not present

## 2016-08-22 DIAGNOSIS — B9689 Other specified bacterial agents as the cause of diseases classified elsewhere: Secondary | ICD-10-CM | POA: Diagnosis not present

## 2017-01-09 DIAGNOSIS — M722 Plantar fascial fibromatosis: Secondary | ICD-10-CM | POA: Diagnosis not present

## 2017-01-09 DIAGNOSIS — G8929 Other chronic pain: Secondary | ICD-10-CM | POA: Diagnosis not present

## 2017-01-09 DIAGNOSIS — Z969 Presence of functional implant, unspecified: Secondary | ICD-10-CM | POA: Diagnosis not present

## 2017-01-09 DIAGNOSIS — M79672 Pain in left foot: Secondary | ICD-10-CM | POA: Diagnosis not present

## 2017-01-18 ENCOUNTER — Other Ambulatory Visit: Payer: Self-pay | Admitting: Obstetrics and Gynecology

## 2017-01-18 DIAGNOSIS — Z1231 Encounter for screening mammogram for malignant neoplasm of breast: Secondary | ICD-10-CM

## 2017-02-24 ENCOUNTER — Ambulatory Visit: Payer: BLUE CROSS/BLUE SHIELD | Admitting: Nurse Practitioner

## 2017-03-01 ENCOUNTER — Ambulatory Visit
Admission: RE | Admit: 2017-03-01 | Discharge: 2017-03-01 | Disposition: A | Discharge status: Home / Self Care | Payer: BLUE CROSS/BLUE SHIELD | Source: Ambulatory Visit | Attending: Obstetrics and Gynecology | Admitting: Obstetrics and Gynecology

## 2017-03-01 DIAGNOSIS — Z1231 Encounter for screening mammogram for malignant neoplasm of breast: Secondary | ICD-10-CM

## 2017-03-10 NOTE — Progress Notes (Signed)
51 y.o. G63P0000 Married Caucasian female here for annual exam.    Menses are regular. On POPs.  Decreased interest in sex.  Denies pain.  Partner and patient are in a pattern she does not like.   Hx low vit D.  Takes vit D 50,000 weekly.   Gains and looses weight.   Mother living independently with Alzheimer's.   Works a Presenter, broadcasting for [employer].  PCP:  Gweneth Dimitri, MD   Patient's last menstrual period was 03/07/2017 (exact date).     Period Cycle (Days): 30 Period Duration (Days): 5 days Period Pattern: Regular Menstrual Flow: Light Menstrual Control: Maxi pad Menstrual Control Change Freq (Hours): every 3-4 hours on heaviest day Dysmenorrhea: (!) Mild Dysmenorrhea Symptoms: Cramping, Headache, Other (Comment) (Migraine 1st day of cycle and gets extremely tired 1st day of cycle)     Sexually active: Yes.   female The current method of family planning is oral progesterone-only contraceptive--Camila.    Exercising: No.   Smoker:  no  Health Maintenance: Pap: 02-17-16 Neg:Neg HR HPV, 01-28-13 Neg:Neg HR HPV History of abnormal Pap:  Yes, Hx of CIN I in 2002 and 2003. Hx CIN I and Colpo 2002. Colpo WNL 2003--no treatment MMG: 03-01-17 Density B/Neg/BiRads1:TBC Colonoscopy: 04/27/15, tubular adenoma, repeat in 5 years BMD:   n/a  Result  n/a TDaP:  02-10-15 Gardasil:   no ZOX:WRUEA ago--Neg Hep C:Years ago--Neg Screening Labs:  Hb today: PCP, Urine today: not done   reports that she has never smoked. She has never used smokeless tobacco. She reports that she drinks about 0.6 oz of alcohol per week . She reports that she does not use drugs.  Past Medical History:  Diagnosis Date  . Abnormal Pap smear   . Hyperlipidemia   . Infertility, female    no method of birth from late 20's through the 67's,  Headaches on OCP  . Migraine    with aura    Past Surgical History:  Procedure Laterality Date  . ANKLE SURGERY  1998/1999   left ankle fracture with ORIF  . CARPAL  TUNNEL RELEASE  05/21/12   right wrist  . COLPOSCOPY     CIN I  . HIP SURGERY Left 03-06-13   --[facility] - Torn Labrum from injury  . LASIK    . MOLE REMOVAL  2000   off back - benign    Current Outpatient Prescriptions  Medication Sig Dispense Refill  . amitriptyline (ELAVIL) 10 MG tablet Take 1 tablet by mouth at bedtime.  3  . calcium carbonate (CALCIUM 600) 600 MG TABS tablet Take 600 mg by mouth 2 (two) times daily with a meal.    . doxycycline (VIBRAMYCIN) 100 MG capsule Take 100 mg by mouth 2 (two) times daily.    Marland Kitchen isometheptene-acetaminophen-dichloralphenazone (MIDRIN) 65-100-325 MG capsule Take 1 capsule by mouth as needed. For headache.  May repeat 2 hours later if needed.  *Max 5 per day*  1  . Multiple Vitamins-Minerals (CENTRUM ULTRA WOMENS PO) Take 1 tablet by mouth daily.    . norethindrone (MICRONOR,CAMILA,ERRIN) 0.35 MG tablet Take 1 tablet (0.35 mg total) by mouth daily. 3 Package 4  . Omega-3 Fatty Acids (FISH OIL) 1200 MG CAPS Take 1 capsule by mouth daily.    . vitamin B-12 (CYANOCOBALAMIN) 500 MCG tablet Take 500 mcg by mouth daily.    . vitamin C (ASCORBIC ACID) 250 MG tablet Take 250 mg by mouth daily.    . Vitamin D, Ergocalciferol, (DRISDOL)  50000 units CAPS capsule Take 1 capsule (50,000 Units total) by mouth every 7 (seven) days. 30 capsule 2   No current facility-administered medications for this visit.     Family History  Problem Relation Age of Onset  . Diabetes Mother   . Alzheimer's disease Mother   . Hypertension Father   . Diabetes Father   . Heart disease Father   . Heart failure Father   . Hypertension Brother   . Colon cancer Brother [id number]       Colectomy with Appendectomy  . Diabetes Brother   . Alcohol abuse Brother   . Alzheimer's disease Maternal Grandmother   . Alzheimer's disease Maternal Aunt     ROS:  Pertinent items are noted in HPI.  Otherwise, a comprehensive ROS was negative.  Exam:   BP (!) 132/98 (BP Location: Right  Arm, Patient Position: Sitting, Cuff Size: Large)   Pulse 70   Resp 14   Ht 5\' 4"  (1.626 m)   Wt 233 lb 6.4 oz (105.9 kg)   LMP 03/07/2017 (Exact Date)   BMI 40.06 kg/m     General appearance: alert, cooperative and appears stated age Head: Normocephalic, without obvious abnormality, atraumatic Neck: no adenopathy, supple, symmetrical, trachea midline and thyroid normal to inspection and palpation Lungs: clear to auscultation bilaterally Breasts: normal appearance, no masses or tenderness, No nipple retraction or dimpling, No nipple discharge or bleeding, No axillary or supraclavicular adenopathy Heart: regular rate and rhythm Abdomen: soft, non-tender; no masses, no organomegaly Extremities: extremities normal, atraumatic, no cyanosis or edema Skin: Skin color, texture, turgor normal. No rashes or lesions Lymph nodes: Cervical, supraclavicular, and axillary nodes normal. No abnormal inguinal nodes palpated Neurologic: Grossly normal  Pelvic: External genitalia:  no lesions              Urethra:  normal appearing urethra with no masses, tenderness or lesions              Bartholins and Skenes: normal                 Vagina: normal appearing vagina with normal color and discharge, no lesions              Cervix: no lesions              Pap taken: No. Bimanual Exam:  Uterus:  normal size, contour, position, consistency, mobility, non-tender              Adnexa: no mass, fullness, tenderness              Rectal exam: Yes.  .  Confirms.              Anus:  normal sphincter tone, no lesions  Chaperone was present for exam.  Assessment:   Well woman visit with normal exam. Remote hx CIN I.  Dysmenorrhea controlled with PCP.  FH colon cancer - brother.  Elevated BP today.  Low vit D level.  Plan: Mammogram screening discussed. Recommended self breast awareness. Pap and HR HPV as above. Guidelines for Calcium, Vitamin D, regular exercise program including cardiovascular and  weight bearing exercise. Refill POPs for one year. She will monitor her BP at home and call her PCP if they are elevated. Check vit D level.  Follow up annually and prn.   After visit summary provided.

## 2017-03-13 ENCOUNTER — Encounter: Payer: Self-pay | Admitting: Obstetrics and Gynecology

## 2017-03-13 ENCOUNTER — Ambulatory Visit (INDEPENDENT_AMBULATORY_CARE_PROVIDER_SITE_OTHER): Payer: BLUE CROSS/BLUE SHIELD | Admitting: Obstetrics and Gynecology

## 2017-03-13 VITALS — BP 132/98 | HR 70 | Resp 14 | Ht 64.0 in | Wt 233.4 lb

## 2017-03-13 DIAGNOSIS — E559 Vitamin D deficiency, unspecified: Secondary | ICD-10-CM

## 2017-03-13 DIAGNOSIS — Z01419 Encounter for gynecological examination (general) (routine) without abnormal findings: Secondary | ICD-10-CM | POA: Diagnosis not present

## 2017-03-13 MED ORDER — NORETHINDRONE 0.35 MG PO TABS: 1.0000 | ORAL_TABLET | Freq: Every day | ORAL | 3 refills | Status: DC

## 2017-03-13 NOTE — Patient Instructions (Signed)

## 2017-03-14 LAB — VITAMIN D 25 HYDROXY (VIT D DEFICIENCY, FRACTURES): VIT D 25 HYDROXY: 40.6 ng/mL (ref 30.0–100.0)

## 2017-04-18 DIAGNOSIS — H60543 Acute eczematoid otitis externa, bilateral: Secondary | ICD-10-CM | POA: Diagnosis not present

## 2017-06-09 DIAGNOSIS — M545 Low back pain: Secondary | ICD-10-CM | POA: Diagnosis not present

## 2017-06-09 DIAGNOSIS — E785 Hyperlipidemia, unspecified: Secondary | ICD-10-CM | POA: Diagnosis not present

## 2017-06-09 DIAGNOSIS — Z0001 Encounter for general adult medical examination with abnormal findings: Secondary | ICD-10-CM | POA: Diagnosis not present

## 2017-06-09 DIAGNOSIS — G43109 Migraine with aura, not intractable, without status migrainosus: Secondary | ICD-10-CM | POA: Diagnosis not present

## 2017-06-14 DIAGNOSIS — M9904 Segmental and somatic dysfunction of sacral region: Secondary | ICD-10-CM | POA: Diagnosis not present

## 2017-06-14 DIAGNOSIS — M9903 Segmental and somatic dysfunction of lumbar region: Secondary | ICD-10-CM | POA: Diagnosis not present

## 2017-06-14 DIAGNOSIS — M545 Low back pain: Secondary | ICD-10-CM | POA: Diagnosis not present

## 2017-06-14 DIAGNOSIS — M9905 Segmental and somatic dysfunction of pelvic region: Secondary | ICD-10-CM | POA: Diagnosis not present

## 2017-06-16 DIAGNOSIS — M9905 Segmental and somatic dysfunction of pelvic region: Secondary | ICD-10-CM | POA: Diagnosis not present

## 2017-06-16 DIAGNOSIS — M9903 Segmental and somatic dysfunction of lumbar region: Secondary | ICD-10-CM | POA: Diagnosis not present

## 2017-06-16 DIAGNOSIS — M9904 Segmental and somatic dysfunction of sacral region: Secondary | ICD-10-CM | POA: Diagnosis not present

## 2017-06-16 DIAGNOSIS — M545 Low back pain: Secondary | ICD-10-CM | POA: Diagnosis not present

## 2017-06-21 DIAGNOSIS — M9904 Segmental and somatic dysfunction of sacral region: Secondary | ICD-10-CM | POA: Diagnosis not present

## 2017-06-21 DIAGNOSIS — M9905 Segmental and somatic dysfunction of pelvic region: Secondary | ICD-10-CM | POA: Diagnosis not present

## 2017-06-21 DIAGNOSIS — M9903 Segmental and somatic dysfunction of lumbar region: Secondary | ICD-10-CM | POA: Diagnosis not present

## 2017-06-21 DIAGNOSIS — M545 Low back pain: Secondary | ICD-10-CM | POA: Diagnosis not present

## 2017-07-03 DIAGNOSIS — M9904 Segmental and somatic dysfunction of sacral region: Secondary | ICD-10-CM | POA: Diagnosis not present

## 2017-07-03 DIAGNOSIS — M9902 Segmental and somatic dysfunction of thoracic region: Secondary | ICD-10-CM | POA: Diagnosis not present

## 2017-07-03 DIAGNOSIS — M9903 Segmental and somatic dysfunction of lumbar region: Secondary | ICD-10-CM | POA: Diagnosis not present

## 2017-07-03 DIAGNOSIS — M545 Low back pain: Secondary | ICD-10-CM | POA: Diagnosis not present

## 2017-07-14 DIAGNOSIS — M9903 Segmental and somatic dysfunction of lumbar region: Secondary | ICD-10-CM | POA: Diagnosis not present

## 2017-07-14 DIAGNOSIS — M9902 Segmental and somatic dysfunction of thoracic region: Secondary | ICD-10-CM | POA: Diagnosis not present

## 2017-07-14 DIAGNOSIS — M9904 Segmental and somatic dysfunction of sacral region: Secondary | ICD-10-CM | POA: Diagnosis not present

## 2017-07-14 DIAGNOSIS — M545 Low back pain: Secondary | ICD-10-CM | POA: Diagnosis not present

## 2017-09-04 DIAGNOSIS — M9904 Segmental and somatic dysfunction of sacral region: Secondary | ICD-10-CM | POA: Diagnosis not present

## 2017-09-04 DIAGNOSIS — M545 Low back pain: Secondary | ICD-10-CM | POA: Diagnosis not present

## 2017-09-04 DIAGNOSIS — M9903 Segmental and somatic dysfunction of lumbar region: Secondary | ICD-10-CM | POA: Diagnosis not present

## 2017-09-04 DIAGNOSIS — M722 Plantar fascial fibromatosis: Secondary | ICD-10-CM | POA: Diagnosis not present

## 2017-09-08 DIAGNOSIS — M9903 Segmental and somatic dysfunction of lumbar region: Secondary | ICD-10-CM | POA: Diagnosis not present

## 2017-09-08 DIAGNOSIS — M9904 Segmental and somatic dysfunction of sacral region: Secondary | ICD-10-CM | POA: Diagnosis not present

## 2017-09-08 DIAGNOSIS — M545 Low back pain: Secondary | ICD-10-CM | POA: Diagnosis not present

## 2017-09-08 DIAGNOSIS — M722 Plantar fascial fibromatosis: Secondary | ICD-10-CM | POA: Diagnosis not present

## 2017-09-15 DIAGNOSIS — M722 Plantar fascial fibromatosis: Secondary | ICD-10-CM | POA: Diagnosis not present

## 2017-09-15 DIAGNOSIS — M9903 Segmental and somatic dysfunction of lumbar region: Secondary | ICD-10-CM | POA: Diagnosis not present

## 2017-09-15 DIAGNOSIS — M9904 Segmental and somatic dysfunction of sacral region: Secondary | ICD-10-CM | POA: Diagnosis not present

## 2017-09-15 DIAGNOSIS — M545 Low back pain: Secondary | ICD-10-CM | POA: Diagnosis not present

## 2017-10-30 DIAGNOSIS — M5416 Radiculopathy, lumbar region: Secondary | ICD-10-CM | POA: Diagnosis not present

## 2017-11-10 ENCOUNTER — Other Ambulatory Visit: Payer: Self-pay | Admitting: Family Medicine

## 2017-11-10 DIAGNOSIS — M5416 Radiculopathy, lumbar region: Secondary | ICD-10-CM

## 2017-11-13 ENCOUNTER — Ambulatory Visit
Admission: RE | Admit: 2017-11-13 | Discharge: 2017-11-13 | Disposition: A | Discharge status: Home / Self Care | Payer: BLUE CROSS/BLUE SHIELD | Source: Ambulatory Visit | Attending: Family Medicine | Admitting: Family Medicine

## 2017-11-13 DIAGNOSIS — M5416 Radiculopathy, lumbar region: Secondary | ICD-10-CM

## 2017-11-13 DIAGNOSIS — M48061 Spinal stenosis, lumbar region without neurogenic claudication: Secondary | ICD-10-CM | POA: Diagnosis not present

## 2017-11-29 DIAGNOSIS — M4310 Spondylolisthesis, site unspecified: Secondary | ICD-10-CM | POA: Diagnosis present

## 2017-11-29 DIAGNOSIS — M48062 Spinal stenosis, lumbar region with neurogenic claudication: Secondary | ICD-10-CM | POA: Diagnosis not present

## 2017-11-29 DIAGNOSIS — M48061 Spinal stenosis, lumbar region without neurogenic claudication: Secondary | ICD-10-CM | POA: Insufficient documentation

## 2017-12-13 ENCOUNTER — Encounter: Payer: Self-pay | Admitting: Physical Therapy

## 2017-12-13 ENCOUNTER — Other Ambulatory Visit: Payer: Self-pay

## 2017-12-13 ENCOUNTER — Ambulatory Visit: Payer: BLUE CROSS/BLUE SHIELD | Attending: Neurological Surgery | Admitting: Physical Therapy

## 2017-12-13 DIAGNOSIS — R293 Abnormal posture: Secondary | ICD-10-CM | POA: Diagnosis not present

## 2017-12-13 DIAGNOSIS — G8929 Other chronic pain: Secondary | ICD-10-CM

## 2017-12-13 DIAGNOSIS — M62830 Muscle spasm of back: Secondary | ICD-10-CM | POA: Diagnosis not present

## 2017-12-13 DIAGNOSIS — M6281 Muscle weakness (generalized): Secondary | ICD-10-CM | POA: Diagnosis not present

## 2017-12-13 DIAGNOSIS — M545 Low back pain: Secondary | ICD-10-CM | POA: Insufficient documentation

## 2017-12-14 NOTE — Therapy (Signed)
[facility] [address] [place], [place], [address] Phone: [phone]   Fax:  [phone]  Physical Therapy Evaluation  Patient Details  Name: Kelsey [name] MRN: [id number] Date of Birth: May 01, 1966 Referring Provider: Barnett AbuHenry Elsner, MD    Encounter Date: 12/13/2017  PT End of Session - 12/14/17 0749    Visit Number  1    Date for PT Re-Evaluation  02/13/18    Authorization Type  BCBS    Authorization Time Period  12/13/17 to 02/13/18    Authorization - Visit Number  1    Authorization - Number of Visits  10    PT Start Time  1531    PT Stop Time  1614    PT Time Calculation (min)  43 min    Activity Tolerance  No increased pain;Patient tolerated treatment well    Behavior During Therapy  [facility] for tasks assessed/performed       Past Medical History:  Diagnosis Date  . Abnormal Pap smear   . Hyperlipidemia   . Infertility, female    no method of birth from late 20's through the 10440's,  Headaches on OCP  . Migraine    with aura    Past Surgical History:  Procedure Laterality Date  . ANKLE SURGERY  1998/1999   left ankle fracture with ORIF  . CARPAL TUNNEL RELEASE  05/21/12   right wrist  . COLPOSCOPY     CIN I  . HIP SURGERY Left 03-06-13   [facility] Lab[facility] from [facility]  . LASIK    . MOLE REMOVAL  2000   off back - benign    There were no vitals filed for this visit.   Subjective Assessment - 12/13/17 1535    Subjective  Pt reports onset of low back pain about one year ago. She saw her PCP back in January because her pain was going down into her buttock region. She had a chiropractor work on her low back which never seemed to get better. She was then referred to doctor Elsner who referred her to PT and is planning to give an injectio next week. She is hoping to avoid surgery.     Currently in Pain?  Yes    Pain Score  6     Pain Location  Back    Pain Orientation  Right;Left    Pain  Descriptors / Indicators  Aching;Squeezing    Pain Type  Chronic pain    Pain Radiating Towards  down buttock    Pain Onset  More than a month ago    Pain Frequency  Constant    Aggravating Factors   bending forward, coming up from a squat, laying on her Lt side    Pain Relieving Factors  not really         [facility] PT Assessment - 12/14/17 0001      Assessment   Medical Diagnosis  spinal stenosis lumbar region    Referring Provider  Barnett AbuHenry Elsner, MD     Next MD Visit  6 weeks from last visit     Prior Therapy  none       Precautions   Precautions  None      Balance Screen   Has the patient fallen in the past 6 months  No    Has the patient had a decrease in activity level because of a fear of falling?   No    Is the patient  reluctant to leave their home because of a fear of falling?   No      Home Public house manager residence      Prior Function   Level of Independence  Independent    Vocation Requirements  works at [employer] and has stand up desk       Cognition   Overall Cognitive Status  Within Functional Limits for tasks assessed      Observation/Other Assessments   Focus on Therapeutic Outcomes (FOTO)   55% limited       Sensation   Additional Comments  numbness and tingling down Lt posterior thigh and down to the foot       Posture/Postural Control   Posture Comments  forward head, rounded shoulders       AROM   Lumbar Flexion  unable due to pt's high level of irritability today    Lumbar Extension  repeated extension in standing x10 reps pain centralized to central low back      Strength   Right Hip Flexion  3/5 pain Lt low back     Right Hip Extension  5/5    Right Hip ABduction  4/5    Left Hip Flexion  4/5    Left Hip Extension  4/5    Left Hip ABduction  3/5    Right Knee Flexion  5/5    Right Knee Extension  5/5    Left Knee Flexion  5/5    Left Knee Extension  5/5      Flexibility   Soft Tissue Assessment /Muscle Length   yes    Hamstrings  Rt 45 deg; Lt 40 deg     Quadriceps  WNL      Palpation   Spinal mobility  not tested due to pt's high level of irritability during session    Palpation comment  tenderness along lumbar paraspinals, Lt and Rt gluteals       Special Tests   Other special tests  (+) straight leg raise Lt and Rt       Transfers   Five time sit to stand comments   10 sec, UE support, pain reported       Ambulation/Gait   Gait Comments  forward flexed posture; decreased hip extension bilaterally                Objective measurements completed on examination: See above findings.      [facility] Adult PT Treatment/Exercise - 12/14/17 0001      Self-Care   Self-Care  Posture    Posture  set up and use of lumbar roll              PT Education - 12/14/17 0748    Education Details  eval findings/POC; use of lumbar roll for support     Person(s) Educated  Patient    Methods  Explanation;Verbal cues    Comprehension  Verbalized understanding;Returned demonstration       PT Short Term Goals - 12/13/17 1651      PT SHORT TERM GOAL #1   Title  Pt will demo consistency and independence with her initial HEP to decrease pain and improve posture.     Time  3    Period  Weeks    Status  New    Target Date  01/03/18      PT SHORT TERM GOAL #2   Title  Pt will demo proper set up and  use of lumbar roll for additional low back support, to improve her ability to sit in the car without increase in LE symptoms or low back pain.     Time  3    Period  Weeks    Status  New        PT Long Term Goals - 12/13/17 1710      PT LONG TERM GOAL #1   Title  Pt will demo improved LE strength to 5/5 MMT which will improve her efficiency with functional activity throughout the day.     Time  6    Period  Weeks    Status  New    Target Date  01/24/18      PT LONG TERM GOAL #2   Title  Pt will report atleast 60% reduction in low back pain and LE symptoms from the start of PT which  will improve her quality of life and decrease the need for surgical intervention.     Time  6    Period  Weeks    Status  New      PT LONG TERM GOAL #3   Title  Pt will demo improve LE and trunk strength evident by her ability to complete 5x sit to stand without UE support and without increase in low back pain.     Time  6    Period  Weeks    Status  New      PT LONG TERM GOAL #4   Title  Pt will demo improved trunk stability and body awareness evident by her ability to pick up a small object from the floor x3 trials without increase in low back pain.     Time  6    Period  Weeks    Status  New             Plan - 12/14/17 0750    Clinical Impression Statement  Pt is a pleasant 52 y.o F referred to OPPT with complaints of chronic low back pain with radicular symptoms down the LLE. She presents with pain increase while sitting or bending forward and has muscle spasm and tenderness along the lumbar paraspinals and gluteals bilaterally. She has weakness of the hip abductors and extensors as well as neural tension along the hamstrings. Due to pt's high pain levels upon arrival, today's session focused on posture adjustments with sitting and providing gentle stretch for home to determine if pt has a true extension preference. Ended session with reports of resolved numbness in the LLE. Pt would benefit from skilled PT to address her limitations in ROM, improve strength of the trunk and LE, and increase her flexibility to allow her to resume daily activity without limitation and avoid costly surgical intervention in the future.     Clinical Presentation  Stable    Clinical Decision Making  Low    Rehab Potential  Good    PT Frequency  2x / week    PT Duration  8 weeks    PT Treatment/Interventions  ADLs/Self Care Home Management;Moist Heat;Traction;Electrical Stimulation;Cryotherapy;Therapeutic exercise;Therapeutic activities;Neuromuscular re-education;Patient/family education;Manual  techniques;Passive range of motion;Dry needling;Taping    PT Next Visit Plan  possible DN lumbar paraspinals/multifidi and glutes; f/u on extension preference (prone on elbows) and progress if needed; gentle hip strength and trunk strengthening; nerve flossing    PT Home Exercise Plan  towel roll     Consulted and Agree with Plan of Care  Patient  Patient will benefit from skilled therapeutic intervention in order to improve the following deficits and impairments:  Decreased activity tolerance, Decreased strength, Impaired flexibility, Postural dysfunction, Pain, Obesity, Improper body mechanics, Decreased range of motion, Hypomobility, Increased muscle spasms  Visit Diagnosis: Chronic bilateral low back pain, with sciatica presence unspecified  Abnormal posture  Muscle weakness (generalized)  Muscle spasm of back     Problem List Patient Active Problem List   Diagnosis Date Noted  . History of migraine headaches 01/28/2013    7:56 AM,12/14/17 Donita Brooks PT, DPT [facility]  [phone]  [facility] [address] [place], [place], [address] Phone: [phone]   Fax:  [phone]  Name: Kelsey [name] MRN: [id number] Date of Birth: November 04, 1965

## 2017-12-18 ENCOUNTER — Ambulatory Visit: Payer: BLUE CROSS/BLUE SHIELD | Attending: Neurological Surgery | Admitting: Physical Therapy

## 2017-12-18 ENCOUNTER — Encounter: Payer: Self-pay | Admitting: Physical Therapy

## 2017-12-18 DIAGNOSIS — M6281 Muscle weakness (generalized): Secondary | ICD-10-CM | POA: Diagnosis not present

## 2017-12-18 DIAGNOSIS — R293 Abnormal posture: Secondary | ICD-10-CM | POA: Diagnosis not present

## 2017-12-18 DIAGNOSIS — M545 Low back pain: Secondary | ICD-10-CM | POA: Insufficient documentation

## 2017-12-18 DIAGNOSIS — G8929 Other chronic pain: Secondary | ICD-10-CM | POA: Diagnosis not present

## 2017-12-18 DIAGNOSIS — M62830 Muscle spasm of back: Secondary | ICD-10-CM | POA: Insufficient documentation

## 2017-12-18 NOTE — Therapy (Signed)
[facility] [address] [place], [place], [address] Phone: [phone]   Fax:  [phone]  Physical Therapy Treatment  Patient Details  Name: Tanisha Lutes MRN: [id number] Date of Birth: Jul 07, 1965 Referring Provider: Barnett Abu, MD    Encounter Date: 12/18/2017  PT End of Session - 12/18/17 1620    Visit Number  2    Date for PT Re-Evaluation  02/13/18    Authorization Type  BCBS    Authorization Time Period  12/13/17 to 02/13/18    Authorization - Visit Number  2    Authorization - Number of Visits  10    PT Start Time  1530    PT Stop Time  1615    PT Time Calculation (min)  45 min    Activity Tolerance  No increased pain;Patient tolerated treatment well    Behavior During Therapy  [facility] for tasks assessed/performed       Past Medical History:  Diagnosis Date  . Abnormal Pap smear   . Hyperlipidemia   . Infertility, female    no method of birth from late 20's through the 52's,  Headaches on OCP  . Migraine    with aura    Past Surgical History:  Procedure Laterality Date  . ANKLE SURGERY  1998/1999   left ankle fracture with ORIF  . CARPAL TUNNEL RELEASE  05/21/12   right wrist  . COLPOSCOPY     CIN I  . HIP SURGERY Left 03-06-13   --[facility] - Torn Labrum from injury  . LASIK    . MOLE REMOVAL  2000   off back - benign    There were no vitals filed for this visit.  Subjective Assessment - 12/18/17 1534    Subjective  Pt reports that things are better, and her back is not as irritated as it has been. The towel helps alot with her sitting.     Currently in Pain?  Yes    Pain Score  4     Pain Location  Back    Pain Orientation  Right;Left;Lower    Pain Descriptors / Indicators  Aching;Squeezing    Pain Type  Chronic pain    Pain Radiating Towards  none currently    Pain Onset  More than a month ago    Pain Frequency  Constant    Aggravating Factors   bending forward, coming up from a  squat, laying on Lt side    Pain Relieving Factors  holding core tight; towel in low back           Nustep L1, x5 min PT present to discuss towel placement, HEP   [facility] Adult PT Treatment/Exercise - 12/18/17 0001      Exercises   Exercises  Lumbar      Lumbar Exercises: Supine   Ab Set  15 reps;3 seconds    AB Set Limitations  tactile cues     Clam  10 reps;Limitations    Clam Limitations  green TB with abdominal brace    Bridge  10 reps    Bridge Limitations  pain monitored     Other Supine Lumbar Exercises  nerve flossing x15 reps each    Other Supine Lumbar Exercises  isometric hip extension hold x5 sec x10 reps each with deep abdominal activation      Manual Therapy   Manual Therapy  Soft tissue mobilization;Myofascial release    Soft tissue mobilization  STM Lt and  Rt gluteals    Myofascial Release  trigger point releasle Lt piriformis              PT Education - 12/18/17 1546    Education Details  technique with therex    Person(s) Educated  Patient    Methods  Explanation;Handout;Verbal cues    Comprehension  Returned demonstration;Verbalized understanding       PT Short Term Goals - 12/18/17 1624      PT SHORT TERM GOAL #1   Title  Pt will demo consistency and independence with her initial HEP to decrease pain and improve posture.     Time  3    Period  Weeks    Status  Achieved      PT SHORT TERM GOAL #2   Title  Pt will demo proper set up and use of lumbar roll for additional low back support, to improve her ability to sit in the car without increase in LE symptoms or low back pain.     Time  3    Period  Weeks    Status  Achieved        PT Long Term Goals - 12/13/17 1710      PT LONG TERM GOAL #1   Title  Pt will demo improved LE strength to 5/5 MMT which will improve her efficiency with functional activity throughout the day.     Time  6    Period  Weeks    Status  New    Target Date  01/24/18      PT LONG TERM GOAL #2   Title  Pt  will report atleast 60% reduction in low back pain and LE symptoms from the start of PT which will improve her quality of life and decrease the need for surgical intervention.     Time  6    Period  Weeks    Status  New      PT LONG TERM GOAL #3   Title  Pt will demo improve LE and trunk strength evident by her ability to complete 5x sit to stand without UE support and without increase in low back pain.     Time  6    Period  Weeks    Status  New      PT LONG TERM GOAL #4   Title  Pt will demo improved trunk stability and body awareness evident by her ability to pick up a small object from the floor x3 trials without increase in low back pain.     Time  6    Period  Weeks    Status  New            Plan - 12/18/17 1547    Clinical Impression Statement  Pt arrived with improving pain overall since her evaluation. She responds well to seated lumbar support with a towel and was able to complete gentle hip and deep abdominal strengthening therex this session. Pt has significant muscle spasm and guarding in the Lt and Rt gluteal, and therapist completed soft tissue mobilization end of session to decrease this with daily activity. Also educated pt on self-trigger point release with a tennis ball which she verbalized understanding of following today's session. Will continue to promote trunk stability, strength and hip strength/flexibility in order to improve pt's functional independence throughout the day.     Rehab Potential  Good    PT Frequency  2x / week    PT Duration  8 weeks    PT Treatment/Interventions  ADLs/Self Care Home Management;Moist Heat;Traction;Electrical Stimulation;Cryotherapy;Therapeutic exercise;Therapeutic activities;Neuromuscular re-education;Patient/family education;Manual techniques;Passive range of motion;Dry needling;Taping    PT Next Visit Plan  continue with soft tissue mobilization to glutes/paraspinals; gentle hip strength and trunk strengthening progression     PT Home Exercise Plan  towel roll; [id number]    Consulted and Agree with Plan of Care  Patient       Patient will benefit from skilled therapeutic intervention in order to improve the following deficits and impairments:  Decreased activity tolerance, Decreased strength, Impaired flexibility, Postural dysfunction, Pain, Obesity, Improper body mechanics, Decreased range of motion, Hypomobility, Increased muscle spasms  Visit Diagnosis: Chronic bilateral low back pain, with sciatica presence unspecified  Abnormal posture  Muscle weakness (generalized)  Muscle spasm of back     Problem List Patient Active Problem List   Diagnosis Date Noted  . History of migraine headaches 01/28/2013    4:26 PM,12/18/17 Donita BrooksSara Aidenjames Heckmann PT, DPT [facility]  [phone]  [facility] [address] [place], [place], [address] Phone: [phone]   Fax:  [phone]  Name: Ezekiel InaBrenda Prokop MRN: [id number] Date of Birth: February 04, 1966

## 2017-12-20 ENCOUNTER — Ambulatory Visit: Payer: BLUE CROSS/BLUE SHIELD | Admitting: Physical Therapy

## 2017-12-20 ENCOUNTER — Encounter: Payer: Self-pay | Admitting: Physical Therapy

## 2017-12-20 DIAGNOSIS — M62830 Muscle spasm of back: Secondary | ICD-10-CM | POA: Diagnosis not present

## 2017-12-20 DIAGNOSIS — G8929 Other chronic pain: Secondary | ICD-10-CM

## 2017-12-20 DIAGNOSIS — R293 Abnormal posture: Secondary | ICD-10-CM

## 2017-12-20 DIAGNOSIS — M6281 Muscle weakness (generalized): Secondary | ICD-10-CM

## 2017-12-20 DIAGNOSIS — M545 Low back pain: Principal | ICD-10-CM

## 2017-12-20 NOTE — Therapy (Signed)
[facility] [address], [address] [place], [place], [address] Phone: [phone]   Fax:  [phone]  Physical Therapy Treatment  Patient Details  Name: Kelsey [name] MRN: [id number] Date of Birth: [birth year] Referring Provider: Barnett AbuHenry Elsner, MD    Encounter Date: 12/20/2017  PT End of Session - 12/20/17 1146    Visit Number  3    Date for PT Re-Evaluation  02/13/18    Authorization Type  BCBS    Authorization Time Period  12/13/17 to 02/13/18    Authorization - Visit Number  3    Authorization - Number of Visits  10    PT Start Time  1146    PT Stop Time  1245    PT Time Calculation (min)  59 min    Activity Tolerance  No increased pain;Patient tolerated treatment well    Behavior During Therapy  [facility] for tasks assessed/performed       Past Medical History:  Diagnosis Date  . Abnormal Pap smear   . Hyperlipidemia   . Infertility, female    no method of birth from late 20's through the 6140's,  Headaches on OCP  . Migraine    with aura    Past Surgical History:  Procedure Laterality Date  . ANKLE SURGERY  1998/1999   left ankle fracture with ORIF  . CARPAL TUNNEL RELEASE  05/21/12   right wrist  . COLPOSCOPY     CIN I  . H[facility] SURGERY [facility] 03-06-13   [facility]  . LASIK    . MOLE REMOVAL  2000   off back - benign    There were no vitals filed for this visit.  Subjective Assessment - 12/20/17 1149    Subjective  My left buttocks is bruisey sore. I feel like my Lt leg feels weak today.    Currently in Pain?  -- Lt buttocks sore 3/10,     Pain Orientation  Left    Pain Descriptors / Indicators  Sore    Multiple Pain Sites  No                       [facility] Adult PT Treatment/Exercise - 12/20/17 0001      Lumbar Exercises: Stretches   Lower Trunk Rotation  -- To the RT 3x 20 sec    Piriformis Stretch  -- Piriformis release with spikey balls.     Figure 4 Stretch  3  reps;20 seconds VC  to engage TvA prior to stretch      Lumbar Exercises: Aerobic   Nustep  L2 x 6 min lumbar roll and PTA present to review status      Lumbar Exercises: Supine   Pelvic Tilt  10 reps;5 seconds    Clam  10 reps TvA concentration    Bridge  10 reps;3 seconds    Straight Leg Raise  -- 6x bil  with VC to engage lower abs      Moist Heat Therapy   Number Minutes Moist Heat  15 Minutes    Moist Heat Location  Lumbar Spine      Electrical Stimulation   Electrical Stimulation Location  Bil lumbar to central glutes     Electrical Stimulation Action  IFC    Electrical Stimulation Parameters  80-150 HZ in RT sidelying with pillow btlegs    Electrical Stimulation Goals  Pain  PT Short Term Goals - 12/18/17 1624      PT SHORT TERM GOAL #1   Title  Pt will demo consistency and independence with her initial HEP to decrease pain and improve posture.     Time  3    Period  Weeks    Status  Achieved      PT SHORT TERM GOAL #2   Title  Pt will demo proper set up and use of lumbar roll for additional low back support, to improve her ability to sit in the car without increase in LE symptoms or low back pain.     Time  3    Period  Weeks    Status  Achieved        PT Long Term Goals - 12/13/17 1710      PT LONG TERM GOAL #1   Title  Pt will demo improved LE strength to 5/5 MMT which will improve her efficiency with functional activity throughout the day.     Time  6    Period  Weeks    Status  New    Target Date  01/24/18      PT LONG TERM GOAL #2   Title  Pt will report atleast 60% reduction in low back pain and LE symptoms from the start of PT which will improve her quality of life and decrease the need for surgical intervention.     Time  6    Period  Weeks    Status  New      PT LONG TERM GOAL #3   Title  Pt will demo improve LE and trunk strength evident by her ability to complete 5x sit to stand without UE support and without increase in  low back pain.     Time  6    Period  Weeks    Status  New      PT LONG TERM GOAL #4   Title  Pt will demo improved trunk stability and body awareness evident by her ability to pick up a small object from the floor x3 trials without increase in low back pain.     Time  6    Period  Weeks    Status  New            Plan - 12/20/17 1147    Clinical Impression Statement  Pt reports she feels she is improving. She has focused on gluteal release exercises at home, and despite them making her feel bruised in her glutes she feels like they are relaxing. She did very well using her core today for her stabilization exerises. HEP was added to to progress facilitation of her core and strengthen her Lt hip abductors.  Added Estim to her lower lumbar into the glutes for pain reduction. She reported this helped alot.     Rehab Potential  Good    PT Frequency  2x / week    PT Duration  8 weeks    PT Treatment/Interventions  ADLs/Self Care Home Management;Moist Heat;Traction;Electrical Stimulation;Cryotherapy;Therapeutic exercise;Therapeutic activities;Neuromuscular re-education;Patient/family education;Manual techniques;Passive range of motion;Dry needling;Taping    PT Next Visit Plan  continue with soft tissue mobilization to glutes/paraspinals; gentle hip strength and trunk strengthening progression, Estim if pt felt beneficial.     PT Home Exercise Plan  towel roll; [email or web address]    Consulted and Agree with Plan of Care  --       Patient will benefit from skilled therapeutic intervention in order to  improve the following deficits and impairments:  Decreased activity tolerance, Decreased strength, Impaired flexibility, Postural dysfunction, Pain, Obesity, Improper body mechanics, Decreased range of motion, Hypomobility, Increased muscle spasms  Visit Diagnosis: Chronic bilateral low back pain, with sciatica presence unspecified  Abnormal posture  Muscle weakness (generalized)  Muscle spasm of  back     Problem List Patient Active Problem List   Diagnosis Date Noted  . History of migraine headaches 01/28/2013    [name] 12/20/2017, 2:51 PM  [facility] [address] [place], [place], [address] Phone: [phone]   Fax:  [phone]  Name: Kelsey [name], Kelsey [name] MRN: [id number] Date of Birth: 09-20-1965  Access Code: [email or web address]  URL: [email or web address]  Date: 12/20/2017  Prepared by: Ane Payment   Exercises  Prone Press Up on Elbows - 7x weekly  Hooklying Isometric Clamshell - 10 reps - 3 sets - 1x daily - 7x weekly  Standing Hip Extension at Wall - 10 reps - 3 sets - 5 hold - 1x daily - 7x weekly  Sidelying Hip Abduction - 10 reps - 2 sets - 2x daily - 7x weekly  Supine Posterior Pelvic Tilt - 10 reps - 2 sets - 5 hold - 2x daily - 7x weekly  Supine Lower Trunk Rotation - 3 reps - 3 sets - 20 hold - 1x daily - 7x weekly

## 2017-12-22 DIAGNOSIS — M5116 Intervertebral disc disorders with radiculopathy, lumbar region: Secondary | ICD-10-CM | POA: Diagnosis not present

## 2017-12-22 DIAGNOSIS — M5416 Radiculopathy, lumbar region: Secondary | ICD-10-CM | POA: Diagnosis not present

## 2017-12-25 ENCOUNTER — Encounter: Payer: BLUE CROSS/BLUE SHIELD | Admitting: Physical Therapy

## 2017-12-27 ENCOUNTER — Encounter: Payer: BLUE CROSS/BLUE SHIELD | Admitting: Physical Therapy

## 2017-12-28 ENCOUNTER — Encounter: Payer: Self-pay | Admitting: Physical Therapy

## 2017-12-28 ENCOUNTER — Ambulatory Visit: Payer: BLUE CROSS/BLUE SHIELD | Admitting: Physical Therapy

## 2017-12-28 DIAGNOSIS — G8929 Other chronic pain: Secondary | ICD-10-CM

## 2017-12-28 DIAGNOSIS — M6281 Muscle weakness (generalized): Secondary | ICD-10-CM | POA: Diagnosis not present

## 2017-12-28 DIAGNOSIS — M545 Low back pain: Principal | ICD-10-CM

## 2017-12-28 DIAGNOSIS — M62830 Muscle spasm of back: Secondary | ICD-10-CM

## 2017-12-28 DIAGNOSIS — R293 Abnormal posture: Secondary | ICD-10-CM | POA: Diagnosis not present

## 2017-12-28 NOTE — Patient Instructions (Signed)
Access Code: [email or web address]  URL: [email or web address]  Date: 12/28/2017  Prepared by: Marylyn IshiharaSara Kiser   Exercises  Prone Press Up on Elbows - 7x weekly  Sidelying Hip Abduction - 10 reps - 2 sets - 2x daily - 7x weekly  Supine Lower Trunk Rotation - 3 reps - 3 sets - 20 hold - 1x daily - 7x weekly  Clamshell with Resistance - 10 reps - 3 sets - 2x daily - 7x weekly  Supine Bridge - 10 reps - 3 sets - 2x daily - 7x weekly  Hooklying Isometric Hip Flexion - 10 reps - 3 sets - 1x daily - 7x weekly     Trigger Point Dry Needling  . What is Trigger Point Dry Needling (DN)? o DN is a physical therapy technique used to treat muscle pain and dysfunction. Specifically, DN helps deactivate muscle trigger points (muscle knots).  o A thin filiform needle is used to penetrate the skin and stimulate the underlying trigger point. The goal is for a local twitch response (LTR) to occur and for the trigger point to relax. No medication of any kind is injected during the procedure.   . What Does Trigger Point Dry Needling Feel Like?  o The procedure feels different for each individual patient. Some patients report that they do not actually feel the needle enter the skin and overall the process is not painful. Very mild bleeding may occur. However, many patients feel a deep cramping in the muscle in which the needle was inserted. This is the local twitch response.   Marland Kitchen. How Will I feel after the treatment? o Soreness is normal, and the onset of soreness may not occur for a few hours. Typically this soreness does not last longer than two days.  o Bruising is uncommon, however; ice can be used to decrease any possible bruising.  o In rare cases feeling tired or nauseous after the treatment is normal. In addition, your symptoms may get worse before they get better, this period will typically not last longer than 24 hours.   . What Can I do After My Treatment? o Increase your hydration by drinking more  water for the next 24 hours. o You may place ice or heat on the areas treated that have become sore, however, do not use heat on inflamed or bruised areas. Heat often brings more relief post needling. o You can continue your regular activities, but vigorous activity is not recommended initially after the treatment for 24 hours. o DN is best combined with other physical therapy such as strengthening, stretching, and other therapies.    [facility] [address], [address] [place], [place] [address] Phone # [phone] Fax [phone]

## 2017-12-28 NOTE — Therapy (Signed)
[facility] [address], [address] [place], [place], [address] Phone: [phone]   Fax:  [phone]  Physical Therapy Treatment  Patient Details  Name: Kelsey [name] MRN: [id number] Date of Birth: 28-May-1965 Referring Provider: Barnett AbuHenry Elsner, MD    Encounter Date: 12/28/2017  PT End of Session - 12/28/17 1318    Visit Number  4    Date for PT Re-Evaluation  02/13/18    Authorization Type  BCBS    Authorization Time Period  12/13/17 to 02/13/18    Authorization - Visit Number  4    Authorization - Number of Visits  10    PT Start Time  1231    PT Stop Time  1317    PT Time Calculation (min)  46 min    Activity Tolerance  No increased pain;Patient tolerated treatment well    Behavior During Therapy  [facility] for tasks assessed/performed       Past Medical History:  Diagnosis Date  . Abnormal Pap smear   . Hyperlipidemia   . Infertility, female    no method of birth from late 20's through the 7540's,  Headaches on OCP  . Migraine    with aura    Past Surgical History:  Procedure Laterality Date  . ANKLE SURGERY  1998/1999   left ankle fracture with ORIF  . CARPAL TUNNEL RELEASE  05/21/12   right wrist  . COLPOSCOPY     CIN I  . HIP SURGERY Left 03-06-13   [facility]  . LASIK    . MOLE REMOVAL  2000   off back - benign    There were no vitals filed for this visit.  Subjective Assessment - 12/28/17 1233    Subjective  Pt reports that her exercises are good. Her Lt leg feels heavy still, but the exercises help with that. She had her shot on Friday and the back feels much better, just tight.     Currently in Pain?  No/denies   just tight across low back                       [facility] Adult PT Treatment/Exercise - 12/28/17 0001      Exercises   Exercises  Lumbar      Lumbar Exercises: Supine   Bent Knee Raise  10 reps    Bridge  10 reps    Bridge Limitations  blue TB around  pelvis for increased glute activation    Straight Leg Raise  10 reps    Straight Leg Raises Limitations  with BUE press into table     Isometric Hip Flexion  10 reps;2 seconds    Isometric Hip Flexion Limitations  abdominal brace with opposite UE    Other Supine Lumbar Exercises  B knees to chest with LE on red physioball x15 reps       Manual Therapy   Manual Therapy  Joint mobilization    Joint Mobilization  Grade III CPAs L2 to S1 x2 bouts     Soft tissue mobilization  STM Lt proximal glute             PT Education - 12/28/17 1318    Education Details  dry needling info; implications for home TENS; updated HEP     Person(s) Educated  Patient    Methods  Explanation;Verbal cues;Handout    Comprehension  Verbalized understanding;Returned demonstration  PT Short Term Goals - 12/18/17 1624      PT SHORT TERM GOAL #1   Title  Pt will demo consistency and independence with her initial HEP to decrease pain and improve posture.     Time  3    Period  Weeks    Status  Achieved      PT SHORT TERM GOAL #2   Title  Pt will demo proper set up and use of lumbar roll for additional low back support, to improve her ability to sit in the car without increase in LE symptoms or low back pain.     Time  3    Period  Weeks    Status  Achieved        PT Long Term Goals - 12/13/17 1710      PT LONG TERM GOAL #1   Title  Pt will demo improved LE strength to 5/5 MMT which will improve her efficiency with functional activity throughout the day.     Time  6    Period  Weeks    Status  New    Target Date  01/24/18      PT LONG TERM GOAL #2   Title  Pt will report atleast 60% reduction in low back pain and LE symptoms from the start of PT which will improve her quality of life and decrease the need for surgical intervention.     Time  6    Period  Weeks    Status  New      PT LONG TERM GOAL #3   Title  Pt will demo improve LE and trunk strength evident by her ability to  complete 5x sit to stand without UE support and without increase in low back pain.     Time  6    Period  Weeks    Status  New      PT LONG TERM GOAL #4   Title  Pt will demo improved trunk stability and body awareness evident by her ability to pick up a small object from the floor x3 trials without increase in low back pain.     Time  6    Period  Weeks    Status  New            Plan - 12/28/17 1319    Clinical Impression Statement  Pt is doing well following her back injection on Friday. Session focused on therex to improve abdominal and LE strength. Pt was able to complete bridges without increase in back pain after the addition of theraband resistance. Therapist completed manual techniques to the low back and Lt gluteals. Pt responds well to this, reporting decrease in stiffness end of session. Will consider dry needling treatment next session.     Rehab Potential  Good    PT Frequency  2x / week    PT Duration  8 weeks    PT Treatment/Interventions  ADLs/Self Care Home Management;Moist Heat;Traction;Electrical Stimulation;Cryotherapy;Therapeutic exercise;Therapeutic activities;Neuromuscular re-education;Patient/family education;Manual techniques;Passive range of motion;Dry needling;Taping    PT Next Visit Plan  DN lumbar/glutes; progress Lt hip strength flexion, extension, abduction; trunk strength progression    PT Home Exercise Plan  towel roll; [email or web address]    Consulted and Agree with Plan of Care  Patient       Patient will benefit from skilled therapeutic intervention in order to improve the following deficits and impairments:  Decreased activity tolerance, Decreased strength, Impaired flexibility, Postural dysfunction, Pain, Obesity, Improper  body mechanics, Decreased range of motion, Hypomobility, Increased muscle spasms  Visit Diagnosis: Chronic bilateral low back pain, with sciatica presence unspecified  Abnormal posture  Muscle weakness (generalized)  Muscle spasm of  back     Problem List Patient Active Problem List   Diagnosis Date Noted  . History of migraine headaches 01/28/2013    1:32 PM,12/28/17 Donita Brooks PT, DPT [facility]  [phone]   [facility] [address] [place], [place], [address] Phone: [phone]   Fax:  [phone]  Name: Kelsey [name] MRN: [id number] Date of Birth: 1965/12/18

## 2018-01-01 ENCOUNTER — Ambulatory Visit: Payer: BLUE CROSS/BLUE SHIELD | Admitting: Physical Therapy

## 2018-01-01 ENCOUNTER — Encounter: Payer: Self-pay | Admitting: Physical Therapy

## 2018-01-01 DIAGNOSIS — R293 Abnormal posture: Secondary | ICD-10-CM

## 2018-01-01 DIAGNOSIS — M545 Low back pain: Principal | ICD-10-CM

## 2018-01-01 DIAGNOSIS — M62830 Muscle spasm of back: Secondary | ICD-10-CM | POA: Diagnosis not present

## 2018-01-01 DIAGNOSIS — G8929 Other chronic pain: Secondary | ICD-10-CM | POA: Diagnosis not present

## 2018-01-01 DIAGNOSIS — M6281 Muscle weakness (generalized): Secondary | ICD-10-CM

## 2018-01-01 NOTE — Therapy (Signed)
[facility] [address], [address] [place], [place], [address] Phone: [phone]   Fax:  [phone]  Physical Therapy Treatment  Patient Details  Name: Kelsey InaBrenda Philbrick MRN: [id number] Date of Birth: 11-11-1965 Referring Provider: Barnett AbuHenry Elsner, MD    Encounter Date: 01/01/2018  PT End of Session - 01/01/18 1617    Visit Number  5    Date for PT Re-Evaluation  02/13/18    Authorization Type  BCBS    Authorization Time Period  12/13/17 to 02/13/18    Authorization - Visit Number  5    Authorization - Number of Visits  10    PT Start Time  1531    PT Stop Time  1615    PT Time Calculation (min)  44 min    Activity Tolerance  No increased pain;Patient tolerated treatment well    Behavior During Therapy  [facility] for tasks assessed/performed       Past Medical History:  Diagnosis Date  . Abnormal Pap smear   . Hyperlipidemia   . Infertility, female    no method of birth from late 20's through the 340's,  Headaches on OCP  . Migraine    with aura    Past Surgical History:  Procedure Laterality Date  . ANKLE SURGERY  1998/1999   left ankle fracture with ORIF  . CARPAL TUNNEL RELEASE  05/21/12   right wrist  . COLPOSCOPY     CIN I  . HIP SURGERY Left 03-06-13   [facility]  . LASIK    . MOLE REMOVAL  2000   off back - benign    There were no vitals filed for this visit.  Subjective Assessment - 01/01/18 1534    Subjective  Pt reports that she was doing good yesterday so she tried to wash the dogs. She is a little sore today, but not bad.     Currently in Pain?  Yes    Pain Score  3     Pain Location  Back    Pain Orientation  Left;Right    Pain Descriptors / Indicators  Aching;Tightness    Pain Type  Chronic pain    Pain Radiating Towards  Lt buttock    Pain Onset  More than a month ago    Pain Frequency  Intermittent    Aggravating Factors   bending forward    Pain Relieving Factors   holding core tight, towel in low back                        [facility] Adult PT Treatment/Exercise - 01/01/18 0001      Exercises   Exercises  Lumbar      Lumbar Exercises: Stretches   Other Lumbar Stretch Exercise  Lt active hamstring stretch in 90/90 position 10x10 sec hold       Lumbar Exercises: Standing   Other Standing Lumbar Exercises  hip abduction with yellow TB 2x10 reps each, BUE pressdown into table for additional abdominal stability       Lumbar Exercises: Seated   Other Seated Lumbar Exercises  abdominal brace with hip flexion 2x10 reps each     Other Seated Lumbar Exercises  abdominal brace with diagonal lifts green TB x15 reps each       Lumbar Exercises: Supine   Other Supine Lumbar Exercises  Lt nerve floss x15 reps       Manual Therapy  Soft tissue mobilization  STM lumbar paraspinals; Lt gluteals       Trigger Point Dry Needling - 01/01/18 1615    Consent Given?  Yes    Education Handout Provided  Yes    Muscles Treated Lower Body  Gluteus minimus;Gluteus maximus   Lt and Rt L4, L5 multifidi   Gluteus Maximus Response  Twitch response elicited;Palpable increased muscle length   Lt   Gluteus Minimus Response  Twitch response elicited;Palpable increased muscle length   Lt          PT Education - 01/01/18 1616    Education Details  technique with therex; hip stretches after dry needling     Person(s) Educated  Patient    Methods  Explanation;Verbal cues;Handout    Comprehension  Verbalized understanding;Returned demonstration       PT Short Term Goals - 01/01/18 1627      PT SHORT TERM GOAL #1   Title  Pt will demo consistency and independence with her initial HEP to decrease pain and improve posture.     Time  3    Period  Weeks    Status  Achieved      PT SHORT TERM GOAL #2   Title  Pt will demo proper set up and use of lumbar roll for additional low back support, to improve her ability to sit in the car without increase in LE  symptoms or low back pain.     Time  3    Period  Weeks    Status  Achieved        PT Long Term Goals - 12/13/17 1710      PT LONG TERM GOAL #1   Title  Pt will demo improved LE strength to 5/5 MMT which will improve her efficiency with functional activity throughout the day.     Time  6    Period  Weeks    Status  New    Target Date  01/24/18      PT LONG TERM GOAL #2   Title  Pt will report atleast 60% reduction in low back pain and LE symptoms from the start of PT which will improve her quality of life and decrease the need for surgical intervention.     Time  6    Period  Weeks    Status  New      PT LONG TERM GOAL #3   Title  Pt will demo improve LE and trunk strength evident by her ability to complete 5x sit to stand without UE support and without increase in low back pain.     Time  6    Period  Weeks    Status  New      PT LONG TERM GOAL #4   Title  Pt will demo improved trunk stability and body awareness evident by her ability to pick up a small object from the floor x3 trials without increase in low back pain.     Time  6    Period  Weeks    Status  New            Plan - 01/01/18 1617    Clinical Impression Statement  Pt reports increase in Lt buttock pain following alot of activity yesterday. Therapist updated pt's HEP to promote Lt hip strength and added theraband resistance with hip abduction. Pt was agreeable to dry needling treatment this session and therapist noted several local twitch responses in the gluteals and  multifidi. Ended with manual to the area and pt reported resolved pain and feeling of heaviness that she noted at the start of today's session. Will continue with current POC.    Rehab Potential  Good    PT Frequency  2x / week    PT Duration  8 weeks    PT Treatment/Interventions  ADLs/Self Care Home Management;Moist Heat;Traction;Electrical Stimulation;Cryotherapy;Therapeutic exercise;Therapeutic activities;Neuromuscular  re-education;Patient/family education;Manual techniques;Passive range of motion;Dry needling;Taping    PT Next Visit Plan  f/u on DN lumbar/glutes; progress Lt hip strength flexion, extension, abduction; trunk strength progression    PT Home Exercise Plan  towel roll; [id number]    Consulted and Agree with Plan of Care  Patient       Patient will benefit from skilled therapeutic intervention in order to improve the following deficits and impairments:  Decreased activity tolerance, Decreased strength, Impaired flexibility, Postural dysfunction, Pain, Obesity, Improper body mechanics, Decreased range of motion, Hypomobility, Increased muscle spasms  Visit Diagnosis: Chronic bilateral low back pain, with sciatica presence unspecified  Abnormal posture  Muscle weakness (generalized)  Muscle spasm of back     Problem List Patient Active Problem List   Diagnosis Date Noted  . History of migraine headaches 01/28/2013     4:37 PM,01/01/18 Donita Brooks PT, DPT [facility]  [phone]  [facility] [address], [address] [place], [place], [address] Phone: [phone]   Fax:  [phone]  Name: Madalaine Portier MRN: [id number] Date of Birth: 1965/10/07

## 2018-01-03 ENCOUNTER — Encounter: Payer: Self-pay | Admitting: Physical Therapy

## 2018-01-03 ENCOUNTER — Ambulatory Visit: Payer: BLUE CROSS/BLUE SHIELD | Admitting: Physical Therapy

## 2018-01-03 DIAGNOSIS — M6281 Muscle weakness (generalized): Secondary | ICD-10-CM | POA: Diagnosis not present

## 2018-01-03 DIAGNOSIS — M62830 Muscle spasm of back: Secondary | ICD-10-CM | POA: Diagnosis not present

## 2018-01-03 DIAGNOSIS — M545 Low back pain: Secondary | ICD-10-CM | POA: Diagnosis not present

## 2018-01-03 DIAGNOSIS — R293 Abnormal posture: Secondary | ICD-10-CM | POA: Diagnosis not present

## 2018-01-03 DIAGNOSIS — G8929 Other chronic pain: Secondary | ICD-10-CM | POA: Diagnosis not present

## 2018-01-03 NOTE — Therapy (Signed)
[facility] [address], [address] [place], [place], [address] Phone:[facility] [phone]   Fax:  [phone]  Physical Therapy Treatment  Patient Details  Name: Kelsey InaBrenda Urbani MRN: [id number] Date of Birth: 1965/07/07 Referring Provider: Barnett AbuHenry Elsner, MD    Encounter Date: 01/03/2018  PT End of Session - 01/03/18 1619    Visit Number  6    Date for PT Re-Evaluation  02/13/18    Authorization Type  BCBS    Authorization Time Period  12/13/17 to 02/13/18    Authorization - Visit Number  6    Authorization - Number of Visits  10    PT Start Time  1530    PT Stop Time  1615    PT Time Calculation (min)  45 min    Activity Tolerance  No increased pain;Patient tolerated treatment well    Behavior During Therapy  [facility] for tasks assessed/performed       Past Medical History:  Diagnosis Date  . Abnormal Pap smear   . Hyperlipidemia   . Infertility, female    no method of birth from late 20's through the 2440's,  Headaches on OCP  . Migraine    with aura    Past Surgical History:  Procedure Laterality Date  . ANKLE SURGERY  1998/1999   left ankle fracture with ORIF  . CARPAL TUNNEL RELEASE  05/21/12   right wrist  . COLPOSCOPY     CIN I  . HIP SURGERY Left 03-06-13   [facility]  . LASIK    . MOLE REMOVAL  2000   off back - benign    There were no vitals filed for this visit.  Subjective Assessment - 01/03/18 1534    Subjective  Pt reports that things are going well. She was sore after the needling, but was able to work most of this out with her massage ball. She does have some discomfort in her Lt groin muscle.     Currently in Pain?  Yes    Pain Score  5     Pain Location  Back    Pain Orientation  Left;Medial    Pain Descriptors / Indicators  Aching;Tightness    Pain Type  Chronic pain    Pain Radiating Towards  Lt buttock     Pain Onset  More than a month ago    Pain Frequency  Intermittent    Aggravating Factors   bending forward; pivoting to the Rt and standing up from sitting     Pain Relieving Factors  massage ball helps some; towel in low back                        [facility] Adult PT Treatment/Exercise - 01/03/18 0001      Exercises   Exercises  Knee/Hip      Lumbar Exercises: Aerobic   Nustep  L1 x6 min, PT present to discuss progress      Knee/Hip Exercises: Standing   Other Standing Knee Exercises  LLE ER/IR on 8" box with green TB 4x5 reps       Knee/Hip Exercises: Seated   Other Seated Knee/Hip Exercises  Lt hip ER with yellow TB 2x10 reps     Sit to Sand  10 reps;without UE support;Other (comment)   green TB around knees      Knee/Hip Exercises: Supine   Bridges with Clamshell  Both;10 reps;1 set  green TB around knees    Other Supine Knee/Hip Exercises  Lt clamshell with green TB x10 reps; 90/90 Lt and Rt clamshell with green TB x10 reps       Manual Therapy   Joint Mobilization  Lt grade III-IV long axis distraction              PT Education - 01/03/18 1618    Education Details  noted limitations in glute strength; updated HEP    Person(s) Educated  Patient    Methods  Explanation;Handout;Verbal cues    Comprehension  Verbalized understanding;Returned demonstration       PT Short Term Goals - 01/01/18 1627      PT SHORT TERM GOAL #1   Title  Pt will demo consistency and independence with her initial HEP to decrease pain and improve posture.     Time  3    Period  Weeks    Status  Achieved      PT SHORT TERM GOAL #2   Title  Pt will demo proper set up and use of lumbar roll for additional low back support, to improve her ability to sit in the car without increase in LE symptoms or low back pain.     Time  3    Period  Weeks    Status  Achieved        PT Long Term Goals - 12/13/17 1710      PT LONG TERM GOAL #1   Title  Pt will demo improved LE strength to 5/5 MMT which will improve her efficiency with functional  activity throughout the day.     Time  6    Period  Weeks    Status  New    Target Date  01/24/18      PT LONG TERM GOAL #2   Title  Pt will report atleast 60% reduction in low back pain and LE symptoms from the start of PT which will improve her quality of life and decrease the need for surgical intervention.     Time  6    Period  Weeks    Status  New      PT LONG TERM GOAL #3   Title  Pt will demo improve LE and trunk strength evident by her ability to complete 5x sit to stand without UE support and without increase in low back pain.     Time  6    Period  Weeks    Status  New      PT LONG TERM GOAL #4   Title  Pt will demo improved trunk stability and body awareness evident by her ability to pick up a small object from the floor x3 trials without increase in low back pain.     Time  6    Period  Weeks    Status  New            Plan - 01/03/18 1619    Clinical Impression Statement  Pt arrived with reports of decrease lumbar tightness/pain following dry needling, however she has recently noticed increase in buttock/groin pain with pivoting and sit to stand activity. Pt demonstrates good passive Lt hip ER to greater than 60 deg, however she is only able to actively rotate to 30 deg, indicating poor rotation hip strength. Today's session focused on therex and HEP updates to further improve her hip strength, pt able to complete all exercises without increase in hip pain. Pt responded well to  long axis hip distraction, reporting decrease in pain. Ended with reports of hip fatigue but good understanding of all additions.    Rehab Potential  Good    PT Frequency  2x / week    PT Duration  8 weeks    PT Treatment/Interventions  ADLs/Self Care Home Management;Moist Heat;Traction;Electrical Stimulation;Cryotherapy;Therapeutic exercise;Therapeutic activities;Neuromuscular re-education;Patient/family education;Manual techniques;Passive range of motion;Dry needling;Taping    PT Next Visit  Plan  f/u on HEP updates; progress Lt hip strength ER, 3-way hip, extension, abduction; trunk strength progression    PT Home Exercise Plan  towel roll; [id number]    Consulted and Agree with Plan of Care  Patient       Patient will benefit from skilled therapeutic intervention in order to improve the following deficits and impairments:  Decreased activity tolerance, Decreased strength, Impaired flexibility, Postural dysfunction, Pain, Obesity, Improper body mechanics, Decreased range of motion, Hypomobility, Increased muscle spasms  Visit Diagnosis: Chronic bilateral low back pain, with sciatica presence unspecified  Abnormal posture  Muscle weakness (generalized)  Muscle spasm of back     Problem List Patient Active Problem List   Diagnosis Date Noted  . History of migraine headaches 01/28/2013    4:23 PM,01/03/18 Donita Brooks PT, DPT [facility]  [phone]  [facility] [address], [address] [place], [place], [address] Phone: [phone]   Fax:  [phone]  Name: Clayton Cohick MRN: [id number] Date of Birth: 04-Jan-1966

## 2018-01-10 ENCOUNTER — Encounter: Payer: Self-pay | Admitting: Physical Therapy

## 2018-01-10 ENCOUNTER — Ambulatory Visit: Payer: BLUE CROSS/BLUE SHIELD | Admitting: Physical Therapy

## 2018-01-10 DIAGNOSIS — M6281 Muscle weakness (generalized): Secondary | ICD-10-CM

## 2018-01-10 DIAGNOSIS — G8929 Other chronic pain: Secondary | ICD-10-CM

## 2018-01-10 DIAGNOSIS — M62830 Muscle spasm of back: Secondary | ICD-10-CM | POA: Diagnosis not present

## 2018-01-10 DIAGNOSIS — R293 Abnormal posture: Secondary | ICD-10-CM | POA: Diagnosis not present

## 2018-01-10 DIAGNOSIS — M545 Low back pain: Secondary | ICD-10-CM | POA: Diagnosis not present

## 2018-01-10 NOTE — Therapy (Signed)
[facility] [address], [address] [place], [place], [address] Phone: [phone]   Fax:  [phone]  Physical Therapy Treatment  Patient Details  Name: Kelsey [name] MRN: [id number] Date of Birth: 11/17/1965 Referring Provider: Barnett Abu, MD    Encounter Date: 01/10/2018  PT End of Session - 01/10/18 1341    Visit Number  7    Date for PT Re-Evaluation  02/13/18    Authorization Type  BCBS    Authorization Time Period  12/13/17 to 02/13/18    Authorization - Visit Number  7    Authorization - Number of Visits  10    PT Start Time  1231    PT Stop Time  1315    PT Time Calculation (min)  44 min    Activity Tolerance  No increased pain;Patient tolerated treatment well    Behavior During Therapy  [facility] for tasks assessed/performed       Past Medical History:  Diagnosis Date  . Abnormal Pap smear   . Hyperlipidemia   . Infertility, female    no method of birth from late 20's through the 45's,  Headaches on OCP  . Migraine    with aura    Past Surgical History:  Procedure Laterality Date  . ANKLE SURGERY  1998/1999   left ankle fracture with ORIF  . CARPAL TUNNEL RELEASE  05/21/12   right wrist  . COLPOSCOPY     CIN I  . HIP SURGERY Left 03-06-13   --[facility] - Torn Labrum from injury  . LASIK    . MOLE REMOVAL  2000   off back - benign    There were no vitals filed for this visit.  Subjective Assessment - 01/10/18 1233    Subjective  Pt states that things are going well. She was able to go hiking with her family over the weekend. She was happy with this.     Currently in Pain?  Yes    Pain Score  3     Pain Location  Back    Pain Orientation  Left;Lower    Pain Descriptors / Indicators  Aching;Tightness    Pain Type  Chronic pain    Pain Radiating Towards  Lt upper buttock     Pain Onset  More than a month ago    Pain Frequency  Intermittent    Aggravating Factors   bending forward; pivoting to the Rt  and standing up from sitting     Pain Relieving Factors  massage ball helps, dry needling                        [facility] Adult PT Treatment/Exercise - 01/10/18 0001      Exercises   Exercises  Knee/Hip      Lumbar Exercises: Aerobic   Nustep  L2 x6 min, PT present to discuss progress       Knee/Hip Exercises: Standing   Other Standing Knee Exercises  LLE ER/IR with LE on mat table 2x15 reps using green TB     Other Standing Knee Exercises  Standing RNT lunge with green TB 5x10 sec hold each LE      Knee/Hip Exercises: Seated   Other Seated Knee/Hip Exercises  Lt hip ER/IR 2x15 reps each with double yellow TB       Manual Therapy   Soft tissue mobilization  STM Lt TFL, glute max  Trigger Point Dry Needling - 01/10/18 1345    Consent Given?  Yes    Muscles Treated Lower Body  Gluteus minimus;Gluteus maximus   Lt L4, L5 multifidi (+) twitch   Gluteus Maximus Response  Twitch response elicited;Palpable increased muscle length    Gluteus Minimus Response  Twitch response elicited;Palpable increased muscle length           PT Education - 01/10/18 1341    Education Details  technique with therex    Person(s) Educated  Patient    Methods  Explanation;Verbal cues    Comprehension  Verbalized understanding       PT Short Term Goals - 01/01/18 1627      PT SHORT TERM GOAL #1   Title  Pt will demo consistency and independence with her initial HEP to decrease pain and improve posture.     Time  3    Period  Weeks    Status  Achieved      PT SHORT TERM GOAL #2   Title  Pt will demo proper set up and use of lumbar roll for additional low back support, to improve her ability to sit in the car without increase in LE symptoms or low back pain.     Time  3    Period  Weeks    Status  Achieved        PT Long Term Goals - 12/13/17 1710      PT LONG TERM GOAL #1   Title  Pt will demo improved LE strength to 5/5 MMT which will improve her efficiency with  functional activity throughout the day.     Time  6    Period  Weeks    Status  New    Target Date  01/24/18      PT LONG TERM GOAL #2   Title  Pt will report atleast 60% reduction in low back pain and LE symptoms from the start of PT which will improve her quality of life and decrease the need for surgical intervention.     Time  6    Period  Weeks    Status  New      PT LONG TERM GOAL #3   Title  Pt will demo improve LE and trunk strength evident by her ability to complete 5x sit to stand without UE support and without increase in low back pain.     Time  6    Period  Weeks    Status  New      PT LONG TERM GOAL #4   Title  Pt will demo improved trunk stability and body awareness evident by her ability to pick up a small object from the floor x3 trials without increase in low back pain.     Time  6    Period  Weeks    Status  New            Plan - 01/10/18 1342    Clinical Impression Statement  Pt was able to walk alot over the weekend with LE fatigue noted on Sunday only. Overall, she does have dull low back ache and muscle spasm in the Lt gluteals. Pt was able to complete hip strengthening with additional trunk stabilization added to standing lunge this session. Pt reported decrease in pain following dry needling to the glutes and lumbar multifidi this session. Will continue with current POC to progress hip flexibility, strength and stability to decrease pain with activity.  Rehab Potential  Good    PT Frequency  2x / week    PT Duration  8 weeks    PT Treatment/Interventions  ADLs/Self Care Home Management;Moist Heat;Traction;Electrical Stimulation;Cryotherapy;Therapeutic exercise;Therapeutic activities;Neuromuscular re-education;Patient/family education;Manual techniques;Passive range of motion;Dry needling;Taping    PT Next Visit Plan  f/u on HEP updates; progress Lt hip strength ER, 3-way hip, extension, abduction; trunk strength progression    PT Home Exercise Plan   towel roll; [id number]    Consulted and Agree with Plan of Care  Patient       Patient will benefit from skilled therapeutic intervention in order to improve the following deficits and impairments:  Decreased activity tolerance, Decreased strength, Impaired flexibility, Postural dysfunction, Pain, Obesity, Improper body mechanics, Decreased range of motion, Hypomobility, Increased muscle spasms  Visit Diagnosis: Chronic bilateral low back pain, with sciatica presence unspecified  Abnormal posture  Muscle weakness (generalized)  Muscle spasm of back     Problem List Patient Active Problem List   Diagnosis Date Noted  . History of migraine headaches 01/28/2013    1:59 PM,01/10/18 Donita BrooksSara Akeem Heppler PT, DPT [facility]  [phone]  [facility] [address], [address] [place], [place], [address] Phone: [phone]   Fax:  [phone]  Name: Ezekiel InaBrenda Jarosz MRN: [id number] Date of Birth: Jun 30, 1965

## 2018-01-23 ENCOUNTER — Ambulatory Visit: Payer: BLUE CROSS/BLUE SHIELD | Attending: Neurological Surgery

## 2018-01-23 DIAGNOSIS — M62830 Muscle spasm of back: Secondary | ICD-10-CM | POA: Diagnosis not present

## 2018-01-23 DIAGNOSIS — G8929 Other chronic pain: Secondary | ICD-10-CM

## 2018-01-23 DIAGNOSIS — M6281 Muscle weakness (generalized): Secondary | ICD-10-CM | POA: Diagnosis not present

## 2018-01-23 DIAGNOSIS — R293 Abnormal posture: Secondary | ICD-10-CM | POA: Diagnosis not present

## 2018-01-23 DIAGNOSIS — M545 Low back pain: Secondary | ICD-10-CM | POA: Insufficient documentation

## 2018-01-23 NOTE — Therapy (Addendum)
[facility] [address], [place], [place], [address] Phone: [phone]   Fax:  [phone]  Physical Therapy Treatment  Patient Details  Name: Kelsey [name] MRN: [id number] Date of Birth: 05-May-1966 Referring Provider: Kristeen Miss, MD    Encounter Date: 01/23/2018  PT End of Session - 01/23/18 0813    Visit Number  8    Date for PT Re-Evaluation  02/13/18    Authorization Type  BCBS    PT Start Time  0731    PT Stop Time  0816   dry needling   PT Time Calculation (min)  45 min    Activity Tolerance  No increased pain;Patient tolerated treatment well    Behavior During Therapy  [facility] for tasks assessed/performed       Past Medical History:  Diagnosis Date  . Abnormal Pap smear   . Hyperlipidemia   . Infertility, female    no method of birth from late 20's through the 64's,  Headaches on OCP  . Migraine    with aura    Past Surgical History:  Procedure Laterality Date  . ANKLE SURGERY  1998/1999   left ankle fracture with ORIF  . CARPAL TUNNEL RELEASE  05/21/12   right wrist  . COLPOSCOPY     CIN I  . HIP SURGERY Left 03-06-13   --[facility] - Torn Labrum from injury  . LASIK    . MOLE REMOVAL  2000   off back - benign    There were no vitals filed for this visit.  Subjective Assessment - 01/23/18 0733    Subjective  My Rt side is bothering me and needling has helped so I want to try needling.  Pt reports 80% overall improvement since the start of care.      Patient Stated Goals  reduce pain    Currently in Pain?  Yes    Pain Score  2     Pain Location  Back    Pain Orientation  Right;Lower    Pain Descriptors / Indicators  Aching;Tightness    Pain Type  Chronic pain    Pain Onset  More than a month ago    Pain Frequency  Intermittent    Aggravating Factors   bending forward, sitting at work    Pain Relieving Factors  dry needling, change of position         [facility] PT Assessment - 01/23/18  0001      Assessment   Medical Diagnosis  spinal stenosis lumbar region      Cognition   Overall Cognitive Status  Within Functional Limits for tasks assessed      Sensation   Additional Comments  intermittent numbness and tingling down Lt posterior thigh and down to the foot       Transfers   Five time sit to stand comments   10 sec, UE support- no pain reported                   [facility] Adult PT Treatment/Exercise - 01/23/18 0001      Lumbar Exercises: Aerobic   Nustep  L2 x6 min, PT present to discuss progress       Modalities   Modalities  Moist Heat      Moist Heat Therapy   Number Minutes Moist Heat  10 Minutes    Moist Heat Location  Lumbar Spine      Manual Therapy   Manual Therapy  Soft tissue mobilization;Myofascial release    Manual therapy comments  soft tissue to Rt gluteals and lumbar paraspinals bil.         Trigger Point Dry Needling - 01/23/18 0737    Consent Given?  Yes    Muscles Treated Lower Body  Gluteus minimus;Gluteus maximus;Piriformis   Rt gluteals and bil lumbar multifidi   Gluteus Maximus Response  Twitch response elicited;Palpable increased muscle length    Gluteus Minimus Response  Twitch response elicited;Palpable increased muscle length    Piriformis Response  Twitch response elicited;Palpable increased muscle length             PT Short Term Goals - 01/01/18 1627      PT SHORT TERM GOAL #1   Title  Pt will demo consistency and independence with her initial HEP to decrease pain and improve posture.     Time  3    Period  Weeks    Status  Achieved      PT SHORT TERM GOAL #2   Title  Pt will demo proper set up and use of lumbar roll for additional low back support, to improve her ability to sit in the car without increase in LE symptoms or low back pain.     Time  3    Period  Weeks    Status  Achieved        PT Long Term Goals - 01/23/18 0736      PT LONG TERM GOAL #1   Title  Pt will demo improved LE  strength to 5/5 MMT which will improve her efficiency with functional activity throughout the day.     Time  6    Period  Weeks    Status  On-going      PT LONG TERM GOAL #2   Title  Pt will report atleast 60% reduction in low back pain and LE symptoms from the start of PT which will improve her quality of life and decrease the need for surgical intervention.     Baseline  80% improved    Status  Achieved      PT LONG TERM GOAL #3   Title  Pt will demo improve LE and trunk strength evident by her ability to complete 5x sit to stand without UE support and without increase in low back pain.     Baseline  no pain reported    Status  Achieved      PT LONG TERM GOAL #4   Title  Pt will demo improved trunk stability and body awareness evident by her ability to pick up a small object from the floor x3 trials without increase in low back pain.     Baseline  still working to improve core strength    Time  6    Period  Weeks    Status  On-going            Plan - 01/23/18 3976    Clinical Impression Statement  Pt reports 80% reduction in Rt LE symptoms since the start of care.  Pt has responded to dry needling with reduced tension and trigger points over the past 3 sessions.  Pt is able to perform 5x sit to stand without UE support and no increase in LBP.  Pt is consistent in HEP for core strength, LE strength and flexibility.  Pt describes Rt LE symptoms as intermittent now.  Pt will continue to benefit from skilled PT to address LBP/radiculopathy, core strength and  hip strength.      PT Frequency  2x / week    PT Duration  8 weeks    PT Treatment/Interventions  ADLs/Self Care Home Management;Moist Heat;Traction;Electrical Stimulation;Cryotherapy;Therapeutic exercise;Therapeutic activities;Neuromuscular re-education;Patient/family education;Manual techniques;Passive range of motion;Dry needling;Taping    PT Next Visit Plan  f/u on HEP updates; assess response to dry needling. progress Lt hip  strength ER, 3-way hip, extension, abduction; trunk strength progression    PT Home Exercise Plan  towel roll; [id number]    Recommended Other Services  initial certification is signed    Consulted and Agree with Plan of Care  Patient       Patient will benefit from skilled therapeutic intervention in order to improve the following deficits and impairments:  Decreased activity tolerance, Decreased strength, Impaired flexibility, Postural dysfunction, Pain, Obesity, Improper body mechanics, Decreased range of motion, Hypomobility, Increased muscle spasms  Visit Diagnosis: Abnormal posture  Chronic bilateral low back pain, with sciatica presence unspecified  Muscle weakness (generalized)  Muscle spasm of back     Problem List Patient Active Problem List   Diagnosis Date Noted  . History of migraine headaches 01/28/2013    Sigurd Sos, PT 01/23/18 8:17 AM PHYSICAL THERAPY DISCHARGE SUMMARY  Visits from Start of Care: 8  Current functional level related to goals / functional outcomes: See above for current status.  Pt didn't return after last visit.     Remaining deficits: See above for current status.    Education / Equipment: HEP Plan: Patient agrees to discharge.  Patient goals were partially met. Patient is being discharged due to not returning since the last visit.  ?????        Sigurd Sos, PT 02/20/18 7:29 AM  [facility] [address], [place], [place], [address] Phone: [phone]   Fax:  [phone]  Name: Saadiya Wilfong MRN: [id number] Date of Birth: Jul 13, 1965

## 2018-01-31 DIAGNOSIS — Z6839 Body mass index (BMI) 39.0-39.9, adult: Secondary | ICD-10-CM | POA: Diagnosis not present

## 2018-01-31 DIAGNOSIS — M48062 Spinal stenosis, lumbar region with neurogenic claudication: Secondary | ICD-10-CM | POA: Diagnosis not present

## 2018-01-31 DIAGNOSIS — R030 Elevated blood-pressure reading, without diagnosis of hypertension: Secondary | ICD-10-CM | POA: Diagnosis not present

## 2018-02-07 ENCOUNTER — Telehealth: Payer: Self-pay | Admitting: Obstetrics and Gynecology

## 2018-02-07 NOTE — Telephone Encounter (Signed)
Left message to call Nils Thor at [phone].  

## 2018-02-07 NOTE — Telephone Encounter (Signed)
I agree with your recommendations.  Encounter closed. 

## 2018-02-07 NOTE — Telephone Encounter (Signed)
Spoke with patient.  Short, light menses in June and July. No menses Aug, cycles started 9/22. Flow is light and "stringy". Mood is more "short tempered". Fatigue. Denies any other GYN symptoms or pain.   Taking POP. No missed or late pills. Patient has read about perimenopause, ok to wait and discuss at AEX 03/30/18? Declines earlier OV at this time.    Instructed patient to monitor cycles, continue POP, return call to office if any new symptoms develop. Keep AEX as scheduled, will review with Dr. Edward Jolly and return call if any additional recommendations.   Routing to Dr. Edward Jolly

## 2018-02-07 NOTE — Telephone Encounter (Signed)
Patient believes she is having some "perimenopasal symptoms and would like to talk with a nurse".

## 2018-02-13 ENCOUNTER — Other Ambulatory Visit: Payer: Self-pay | Admitting: Obstetrics and Gynecology

## 2018-02-13 NOTE — Telephone Encounter (Signed)
Medication refill request: Kelsey [name] Last AEX:  03/13/2017 Next AEX: 03/30/2018 Last MMG (if hormonal medication request): 03/01/2017 BI-RADS CATEGORY  1: Negative. Refill authorized: #30, 0 refills

## 2018-02-23 ENCOUNTER — Other Ambulatory Visit: Payer: Self-pay | Admitting: Obstetrics and Gynecology

## 2018-02-23 ENCOUNTER — Other Ambulatory Visit: Payer: Self-pay | Admitting: Neurological Surgery

## 2018-02-23 DIAGNOSIS — Z1231 Encounter for screening mammogram for malignant neoplasm of breast: Secondary | ICD-10-CM

## 2018-02-24 DIAGNOSIS — Z23 Encounter for immunization: Secondary | ICD-10-CM | POA: Diagnosis not present

## 2018-03-06 ENCOUNTER — Other Ambulatory Visit: Payer: Self-pay | Admitting: Neurological Surgery

## 2018-03-07 ENCOUNTER — Telehealth: Payer: Self-pay | Admitting: Vascular Surgery

## 2018-03-07 ENCOUNTER — Other Ambulatory Visit: Payer: Self-pay | Admitting: *Deleted

## 2018-03-07 NOTE — Telephone Encounter (Signed)
-----   Message from Rebecca J Roberts, RN sent at 03/06/2018  2:30 PM EDT ----- °Regarding: OFFICE APPOINTMENT °PATIENT NEEDS AN OFFICE APPOINTMENT WITH DR. CLARK APROX, 2 WEEKS PRIOR TO 05/04/2018 ALIF SURGERY WITH DR. ELSNER. THANK YOU, BECKY PLEASE REMIND ALIF PATIENT TO BRING ANY AND ALL FILMS THEY MAY HAVE. °

## 2018-03-07 NOTE — Telephone Encounter (Signed)
-----   Message from Retta Mac, RN sent at 03/06/2018  2:30 PM EDT ----- Regarding: OFFICE APPOINTMENT PATIENT NEEDS AN OFFICE APPOINTMENT WITH DR. CLARK APROX, 2 WEEKS PRIOR TO 05/04/2018 ALIF SURGERY WITH DR. Danielle Dess. THANK YOU, BECKY PLEASE REMIND ALIF PATIENT TO BRING ANY AND ALL FILMS THEY MAY HAVE.

## 2018-03-07 NOTE — Telephone Encounter (Signed)
sch appt lvm mdl ltr 04/17/18 830am f/u MD  °

## 2018-03-07 NOTE — Telephone Encounter (Signed)
sch appt lvm mdl ltr 04/17/18 830am f/u MD

## 2018-03-09 ENCOUNTER — Other Ambulatory Visit: Payer: Self-pay | Admitting: Obstetrics and Gynecology

## 2018-03-09 NOTE — Telephone Encounter (Signed)
Medication refill request: Heather 0.35  Last AEX:  03/13/17 Next AEX: 03/30/18 Last MMG (if hormonal medication request): 03/01/17 Bi-rads 1 Neg  Refill authorized: #28 with 0 RF

## 2018-03-14 DIAGNOSIS — L218 Other seborrheic dermatitis: Secondary | ICD-10-CM | POA: Diagnosis not present

## 2018-03-14 DIAGNOSIS — L820 Inflamed seborrheic keratosis: Secondary | ICD-10-CM | POA: Diagnosis not present

## 2018-03-28 NOTE — Progress Notes (Signed)
52 y.o. 320P0000 Married Caucasian female here for annual exam.   Having back surgery in December.  Has pain.  Did PT and steroid injection.   Patient states her cycles or irregular and also complains of decreased libido.  June - short menses.  July - very little bleeding August - skipped menses. Sept - 3 day cycle and then 10 days later spotted again.  October 31 - 7 day cycle which occurred 6 weeks after Sept cycle.  Taking POPs on time.   No hot flashes.   PCP:  Gweneth DimitriWendy McNeill, MD   Patient's last menstrual period was 03/15/2018 (exact date).     Period Pattern: (!) Irregular     Sexually active: Yes.   female The current method of family planning is oral progesterone-only contraceptive.    Exercising: No.   Smoker:  no  Health Maintenance: Pap: 02-17-16 Neg:Neg HR HPV, 01-28-13 Neg:Neg HR HPV History of abnormal Pap:  Yes,Hx of CIN I in 2002 and 2003. Hx CIN I and Colpo 2002. Colpo WNL 2003--no treatment MMG: 03-01-17 Neg/density B/BiRads1--appt.04-24-18 Colonoscopy:  04/27/15, tubular adenoma, repeat in 5 years  BMD:  n/a  Result  n/a TDaP: 02-10-15 Gardasil:   no HIV: Years ago--Neg Hep C: Years ago--Neg Screening Labs:  Hb today: PCP  Flu vaccine:  Done in Sept.   reports that she has never smoked. She has never used smokeless tobacco. She reports that she drinks about 1.0 standard drinks of alcohol per week. She reports that she does not use drugs.  Past Medical History:  Diagnosis Date  . Abnormal Pap smear   . Bulging lumbar disc    L4, l5--scheduled for surgery 05-04-18--Dr. Elsner  . Hyperlipidemia   . Infertility, female    no method of birth from late 20's through the 8040's,  Headaches on OCP  . Migraine    with aura    Past Surgical History:  Procedure Laterality Date  . ANKLE SURGERY  1998/1999   left ankle fracture with ORIF  . CARPAL TUNNEL RELEASE  05/21/12   right wrist  . COLPOSCOPY     CIN I  . HIP SURGERY Left 03-06-13   --[facility] -  Torn Labrum from injury  . LASIK    . MOLE REMOVAL  2000   off back - benign    Current Outpatient Medications  Medication Sig Dispense Refill  . amitriptyline (ELAVIL) 10 MG tablet Take 1 tablet by mouth at bedtime.  3  . aspirin EC 81 MG tablet Take 81 mg by mouth daily.    . calcium carbonate (CALCIUM 600) 600 MG TABS tablet Take 600 mg by mouth 2 (two) times daily with a meal.    . doxycycline (VIBRAMYCIN) 100 MG capsule Take 100 mg by mouth 2 (two) times daily.    . Magnesium 500 MG CAPS Take 1 capsule by mouth daily.    . Multiple Vitamins-Minerals (CENTRUM ULTRA WOMENS PO) Take 1 tablet by mouth daily.    . norethindrone (CAMILA) 0.35 MG tablet Take 1 tablet (0.35 mg total) by mouth daily. 3 Package 3  . Omega-3 Fatty Acids (FISH OIL) 1200 MG CAPS Take 1 capsule by mouth daily.    . traMADol (ULTRAM) 50 MG tablet Take 1 tablet by mouth as needed.  0  . vitamin B-12 (CYANOCOBALAMIN) 500 MCG tablet Take 500 mcg by mouth daily.    . vitamin C (ASCORBIC ACID) 250 MG tablet Take 250 mg by mouth daily.    .Marland Kitchen  Vitamin D, Ergocalciferol, (DRISDOL) 50000 units CAPS capsule Take 1 capsule (50,000 Units total) by mouth every 7 (seven) days. 30 capsule 2   No current facility-administered medications for this visit.     Family History  Problem Relation Age of Onset  . Diabetes Mother   . Alzheimer's disease Mother   . Hypertension Father   . Diabetes Father   . Heart disease Father   . Heart failure Father   . Hypertension Brother   . Colon cancer Brother 42       Colectomy with Appendectomy  . Diabetes Brother   . Alcohol abuse Brother   . Alzheimer's disease Maternal Grandmother   . Alzheimer's disease Maternal Aunt     Review of Systems  All other systems reviewed and are negative.   Exam:   BP 140/78   Pulse 90   Resp 14   Ht 5' 3.2" (1.605 m)   Wt 231 lb (104.8 kg)   LMP 03/15/2018 (Exact Date)   BMI 40.66 kg/m     General appearance: alert, cooperative and appears  stated age Head: Normocephalic, without obvious abnormality, atraumatic Neck: no adenopathy, supple, symmetrical, trachea midline and thyroid normal to inspection and palpation Lungs: clear to auscultation bilaterally Breasts: normal appearance, no masses or tenderness, No nipple retraction or dimpling, No nipple discharge or bleeding, No axillary or supraclavicular adenopathy Heart: regular rate and rhythm Abdomen: soft, non-tender; no masses, no organomegaly Extremities: extremities normal, atraumatic, no cyanosis or edema Skin: Skin color, texture, turgor normal. No rashes or lesions Lymph nodes: Cervical, supraclavicular, and axillary nodes normal. No abnormal inguinal nodes palpated Neurologic: Grossly normal  Pelvic: External genitalia:  no lesions              Urethra:  normal appearing urethra with no masses, tenderness or lesions              Bartholins and Skenes: normal                 Vagina: normal appearing vagina with normal color and discharge, no lesions              Cervix: no lesions              Pap taken: No. Bimanual Exam:  Uterus:  normal size, contour, position, consistency, mobility, non-tender              Adnexa: no mass, fullness, tenderness              Rectal exam: Yes.  .  Confirms.              Anus:  normal sphincter tone, no lesions  Chaperone was present for exam.  Assessment:   Well woman visit with normal exam. Remote hx CIN I.  Dysmenorrhea controlled with POP.  FH colon cancer - brother.   Plan: Mammogram screening. Recommended self breast awareness. Pap and HR HPV as above. Guidelines for Calcium, Vitamin D, regular exercise program including cardiovascular and weight bearing exercise. Refill of POPs for one year.  Keep bleeding calendar and call if menses too frequent, prolonged, or too heavy.  Will have her stop her POPs 2 weeks before her annual next year and then check her FHS, estradiol. Follow up annually and prn.    After visit  summary provided.

## 2018-03-30 ENCOUNTER — Other Ambulatory Visit: Payer: Self-pay

## 2018-03-30 ENCOUNTER — Encounter: Payer: Self-pay | Admitting: Obstetrics and Gynecology

## 2018-03-30 ENCOUNTER — Ambulatory Visit (INDEPENDENT_AMBULATORY_CARE_PROVIDER_SITE_OTHER): Payer: BLUE CROSS/BLUE SHIELD | Admitting: Obstetrics and Gynecology

## 2018-03-30 VITALS — BP 140/78 | HR 90 | Resp 14 | Ht 63.2 in | Wt 231.0 lb

## 2018-03-30 DIAGNOSIS — Z01419 Encounter for gynecological examination (general) (routine) without abnormal findings: Secondary | ICD-10-CM

## 2018-03-30 MED ORDER — NORETHINDRONE 0.35 MG PO TABS: 1.0000 | ORAL_TABLET | Freq: Every day | ORAL | 3 refills | Status: DC

## 2018-03-30 NOTE — Patient Instructions (Signed)

## 2018-04-01 DIAGNOSIS — S61203A Unspecified open wound of left middle finger without damage to nail, initial encounter: Secondary | ICD-10-CM | POA: Diagnosis not present

## 2018-04-11 DIAGNOSIS — Z4802 Encounter for removal of sutures: Secondary | ICD-10-CM | POA: Diagnosis not present

## 2018-04-17 ENCOUNTER — Ambulatory Visit (INDEPENDENT_AMBULATORY_CARE_PROVIDER_SITE_OTHER): Payer: BLUE CROSS/BLUE SHIELD | Admitting: Vascular Surgery

## 2018-04-17 ENCOUNTER — Encounter: Payer: Self-pay | Admitting: Vascular Surgery

## 2018-04-17 ENCOUNTER — Other Ambulatory Visit: Payer: Self-pay

## 2018-04-17 DIAGNOSIS — M5136 Other intervertebral disc degeneration, lumbar region: Secondary | ICD-10-CM

## 2018-04-17 NOTE — Progress Notes (Signed)
Patient name: Kelsey [name] MRN: [id number] DOB: January 04, 1966 Sex: female  REASON FOR CONSULT: L4-L5 ALIF  HPI: Kelsey [name] is a 52 y.o. female, with history of hyperlipidemia and infertility that presents for preoperative evaluation for L4-L5 Alif with Dr. Danielle Dess.  Patient states that she has had shooting pain down her left leg with radiculopathy for some time that has become unbearable.  States her left leg is far more symptomatic than her right leg.  She has been evaluated by Dr. Danielle Dess who has noted degenerative spondylolisthesis at the L4-L5 level.  She was in physical therapy with minimal improvement.  She denies any previous abdominal surgery.  She denies any blood thinners.  She states her only other surgical history is a left labrum operation years ago.  Past Medical History:  Diagnosis Date  . Abnormal Pap smear   . Bulging lumbar disc    L4, l5--scheduled for surgery 05-04-18--Dr. Elsner  . Hyperlipidemia   . Infertility, female    no method of birth from late 20's through the 23's,  Headaches on OCP  . Migraine    with aura    Past Surgical History:  Procedure Laterality Date  . ANKLE SURGERY  1998/1999   left ankle fracture with ORIF  . CARPAL TUNNEL RELEASE  05/21/12   right wrist  . COLPOSCOPY     CIN I  . HIP SURGERY Left 03-06-13   --[facility] - Torn Labrum from injury  . LASIK    . MOLE REMOVAL  2000   off back - benign    Family History  Problem Relation Age of Onset  . Diabetes Mother   . Alzheimer's disease Mother   . Hypertension Father   . Diabetes Father   . Heart disease Father   . Heart failure Father   . Hypertension Brother   . Colon cancer Brother 36       Colectomy with Appendectomy  . Diabetes Brother   . Alcohol abuse Brother   . Alzheimer's disease Maternal Grandmother   . Alzheimer's disease Maternal Aunt     SOCIAL HISTORY: Social History   Socioeconomic History  . Marital status: Married    Spouse name: Not on file  . Number  of children: Not on file  . Years of education: Not on file  . Highest education level: Not on file  Occupational History  . Not on file  Social Needs  . Financial resource strain: Not on file  . Food insecurity:    Worry: Not on file    Inability: Not on file  . Transportation needs:    Medical: Not on file    Non-medical: Not on file  Tobacco Use  . Smoking status: Never Smoker  . Smokeless tobacco: Never Used  Substance and Sexual Activity  . Alcohol use: Yes    Alcohol/week: 1.0 standard drinks    Types: 1 Glasses of wine per week    Comment: 1 month  . Drug use: No  . Sexual activity: Yes    Partners: Male    Birth control/protection: Pill    Comment: Norethindrone  Lifestyle  . Physical activity:    Days per week: Not on file    Minutes per session: Not on file  . Stress: Not on file  Relationships  . Social connections:    Talks on phone: Not on file    Gets together: Not on file    Attends religious service: Not on file  Active member of club or organization: Not on file    Attends meetings of clubs or organizations: Not on file    Relationship status: Not on file  . Intimate partner violence:    Fear of current or ex partner: Not on file    Emotionally abused: Not on file    Physically abused: Not on file    Forced sexual activity: Not on file  Other Topics Concern  . Not on file  Social History Narrative  . Not on file    Allergies  Allergen Reactions  . Lodine [Etodolac]     hives    Current Outpatient Medications  Medication Sig Dispense Refill  . amitriptyline (ELAVIL) 10 MG tablet Take 1 tablet by mouth at bedtime.  3  . aspirin EC 81 MG tablet Take 81 mg by mouth daily.    . calcium carbonate (CALCIUM 600) 600 MG TABS tablet Take 600 mg by mouth 2 (two) times daily with a meal.    . doxycycline (VIBRAMYCIN) 100 MG capsule Take 100 mg by mouth 2 (two) times daily.    . Magnesium 500 MG CAPS Take 1 capsule by mouth daily.    . Multiple  Vitamins-Minerals (CENTRUM ULTRA WOMENS PO) Take 1 tablet by mouth daily.    . norethindrone (CAMILA) 0.35 MG tablet Take 1 tablet (0.35 mg total) by mouth daily. 3 Package 3  . Omega-3 Fatty Acids (FISH OIL) 1200 MG CAPS Take 1 capsule by mouth daily.    . traMADol (ULTRAM) 50 MG tablet Take 1 tablet by mouth as needed.  0  . vitamin B-12 (CYANOCOBALAMIN) 500 MCG tablet Take 500 mcg by mouth daily.    . vitamin C (ASCORBIC ACID) 250 MG tablet Take 250 mg by mouth daily.    . Vitamin D, Ergocalciferol, (DRISDOL) 50000 units CAPS capsule Take 1 capsule (50,000 Units total) by mouth every 7 (seven) days. 30 capsule 2  . doxycycline (VIBRA-TABS) 100 MG tablet TAKE 1 TABLET BY MOUTH TWICE A DAY WITH FOOD **WATCH SUN**  3   No current facility-administered medications for this visit.     REVIEW OF SYSTEMS:  [X]  denotes positive finding, [ ]  denotes negative finding Cardiac  Comments:  Chest pain or chest pressure:    Shortness of breath upon exertion:    Short of breath when lying flat:    Irregular heart rhythm:        Vascular    Pain in calf, thigh, or hip brought on by ambulation:    Pain in feet at night that wakes you up from your sleep:     Blood clot in your veins:    Leg swelling:         Pulmonary    Oxygen at home:    Productive cough:     Wheezing:         Neurologic    Sudden weakness in arms or legs:     Sudden numbness in arms or legs:  x Left leg  Sudden onset of difficulty speaking or slurred speech:    Temporary loss of vision in one eye:     Problems with dizziness:         Gastrointestinal    Blood in stool:     Vomited blood:         Genitourinary    Burning when urinating:     Blood in urine:        Psychiatric    Major depression:  Hematologic    Bleeding problems:    Problems with blood clotting too easily:        Skin    Rashes or ulcers:        Constitutional    Fever or chills:      PHYSICAL EXAM: Vitals:   04/17/18 0830  BP:  (!) 169/95  Pulse: 75  Resp: 16  Temp: (!) 97.3 F (36.3 C)  TempSrc: Oral  SpO2: 98%  Weight: 231 lb (104.8 kg)  Height: 5\' 4"  (1.626 m)    GENERAL: The patient is a well-nourished female, in no acute distress. The vital signs are documented above. CARDIAC: There is a regular rate and rhythm.  VASCULAR:  2+ radial pulse palpable bilateral upper extremities 2+ femoral pulse palpable bilaterally 2+ DP/PT palpable bilateral lower extremities PULMONARY: There is good air exchange bilaterally without wheezing or rales. ABDOMEN: Obese, Soft and non-tender with normal pitched bowel sounds, no previous incisions. MUSCULOSKELETAL: There are no major deformities or cyanosis. NEUROLOGIC: No focal weakness or paresthesias are detected. SKIN: There are no ulcers or rashes noted. PSYCHIATRIC: The patient has a normal affect.  DATA:   I do not have her MRI to review at this time.  Will attempt to get her MRI.  Assessment/Plan:  52 year old female with degenerative spondylolisthesis at the L4-L5 level that presents for preoperative evaluation for anterior spine exposure at the L4-L5 level with Dr. Danielle Dess.  After evaluation of the patient I think she would be a very reasonable candidate for surgery that is scheduled on 05/04/2018.  I will attempt to obtain her MRI just to review for completeness.  We did discuss risks and benefits of surgery including risk of bowel injury, injury to artery or vein, risk of bleeding, injury to adjacent structures, ileus, etc.  Also discussed with BMI of 40 will be a more difficult spinal exposure.     Cephus Shelling, MD [facility] Office: [phone] Pager: [phone]   Cephus Shelling

## 2018-04-23 ENCOUNTER — Inpatient Hospital Stay: Admit: 2018-04-23 | Payer: BLUE CROSS/BLUE SHIELD | Admitting: Neurological Surgery

## 2018-04-23 SURGERY — ANTERIOR LUMBAR FUSION 1 LEVEL
Anesthesia: General

## 2018-04-24 ENCOUNTER — Ambulatory Visit
Admission: RE | Admit: 2018-04-24 | Discharge: 2018-04-24 | Disposition: A | Discharge status: Home / Self Care | Payer: BLUE CROSS/BLUE SHIELD | Source: Ambulatory Visit | Attending: Obstetrics and Gynecology | Admitting: Obstetrics and Gynecology

## 2018-04-24 DIAGNOSIS — Z1231 Encounter for screening mammogram for malignant neoplasm of breast: Secondary | ICD-10-CM | POA: Diagnosis not present

## 2018-04-26 NOTE — Pre-Procedure Instructions (Signed)
Kelsey [name]  04/26/2018      [id number]- [place], [place] - [address] [place] [address] Phone: [phone] Fax: [phone]    Your procedure is scheduled on May 04, 2018.  [facility] Admitting at 530 AM.  Call this number if you have problems the morning of surgery:  [phone]   Remember:  Do not eat or drink after midnight.    Take these medicines the morning of surgery with A SIP OF WATER  Doxycycline (Vibra-tabs)- if needed  Follow your surgeon's instructions on when to hold/resume aspirin.  If no instructions were given call the office to determine how they would like to you take aspirin  7 days prior to surgery STOP taking any Aleve, Naproxen, Ibuprofen, Motrin, Advil, Goody's, BC's, all herbal medications, fish oil, and all vitamins    Do not wear jewelry, make-up or nail polish.  Do not wear lotions, powders, or perfumes, or deodorant.  Do not shave 48 hours prior to surgery.    Do not bring valuables to the hospital.  [facility] is not responsible for any belongings or valuables.  Contacts, dentures or bridgework may not be worn into surgery.  Leave your suitcase in the car.  After surgery it may be brought to your room.  For patients admitted to the hospital, discharge time will be determined by your treatment team.  Patients discharged the day of surgery will not be allowed to drive home.    [facility]- Preparing For Surgery  Before surgery, you can play an important role. Because skin is not sterile, your skin needs to be as free of germs as possible. You can reduce the number of germs on your skin by washing with CHG (chlorahexidine gluconate) Soap before surgery.  CHG is an antiseptic cleaner which kills germs and bonds with the skin to continue killing germs even after washing.    Oral Hygiene is also important to reduce your risk of infection.  Remember - BRUSH YOUR TEETH THE MORNING OF  SURGERY WITH YOUR REGULAR TOOTHPASTE  Please do not use if you have an allergy to CHG or antibacterial soaps. If your skin becomes reddened/irritated stop using the CHG.  Do not shave (including legs and underarms) for at least 48 hours prior to first CHG shower. It is OK to shave your face.  Please follow these instructions carefully.   1. Shower the NIGHT BEFORE SURGERY and the MORNING OF SURGERY with CHG.   2. If you chose to wash your hair, wash your hair first as usual with your normal shampoo.  3. After you shampoo, rinse your hair and body thoroughly to remove the shampoo.  4. Use CHG as you would any other liquid soap. You can apply CHG directly to the skin and wash gently with a scrungie or a clean washcloth.   5. Apply the CHG Soap to your body ONLY FROM THE NECK DOWN.  Do not use on open wounds or open sores. Avoid contact with your eyes, ears, mouth and genitals (private parts). Wash Face and genitals (private parts)  with your normal soap.  6. Wash thoroughly, paying special attention to the area where your surgery will be performed.  7. Thoroughly rinse your body with warm water from the neck down.  8. DO NOT shower/wash with your normal soap after using and rinsing off the CHG Soap.  9. Pat yourself dry with a CLEAN TOWEL.  10. Wear  CLEAN PAJAMAS to bed the night before surgery, wear comfortable clothes the morning of surgery  11. Place CLEAN SHEETS on your bed the night of your first shower and DO NOT SLEEP WITH PETS.  Day of Surgery:  Do not apply any deodorants/lotions.  Please wear clean clothes to the hospital/surgery center.   Remember to brush your teeth WITH YOUR REGULAR TOOTHPASTE.   Please read over the following fact sheets that you were given.

## 2018-04-27 ENCOUNTER — Encounter (HOSPITAL_COMMUNITY): Payer: Self-pay

## 2018-04-27 ENCOUNTER — Encounter (HOSPITAL_COMMUNITY)
Admission: RE | Admit: 2018-04-27 | Discharge: 2018-04-27 | Disposition: A | Discharge status: Home / Self Care | Payer: BLUE CROSS/BLUE SHIELD | Source: Ambulatory Visit | Attending: Neurological Surgery | Admitting: Neurological Surgery

## 2018-04-27 ENCOUNTER — Other Ambulatory Visit: Payer: Self-pay

## 2018-04-27 DIAGNOSIS — E785 Hyperlipidemia, unspecified: Secondary | ICD-10-CM | POA: Diagnosis not present

## 2018-04-27 DIAGNOSIS — Z01818 Encounter for other preprocedural examination: Secondary | ICD-10-CM | POA: Diagnosis not present

## 2018-04-27 DIAGNOSIS — M5136 Other intervertebral disc degeneration, lumbar region: Secondary | ICD-10-CM | POA: Diagnosis not present

## 2018-04-27 LAB — CBC
HCT: 43.9 % (ref 36.0–46.0)
Hemoglobin: 14.4 g/dL (ref 12.0–15.0)
MCH: 28.7 pg (ref 26.0–34.0)
MCHC: 32.8 g/dL (ref 30.0–36.0)
MCV: 87.5 fL (ref 80.0–100.0)
PLATELETS: 330 10*3/uL (ref 150–400)
RBC: 5.02 MIL/uL (ref 3.87–5.11)
RDW: 12.2 % (ref 11.5–15.5)
WBC: 8.1 10*3/uL (ref 4.0–10.5)
nRBC: 0 % (ref 0.0–0.2)

## 2018-04-27 LAB — SURGICAL PCR SCREEN
MRSA, PCR: NEGATIVE
Staphylococcus aureus: NEGATIVE

## 2018-04-27 LAB — ABO/RH: ABO/RH(D): O POS

## 2018-04-27 LAB — BASIC METABOLIC PANEL
ANION GAP: 12 (ref 5–15)
BUN: 13 mg/dL (ref 6–20)
CALCIUM: 9.4 mg/dL (ref 8.9–10.3)
CO2: 23 mmol/L (ref 22–32)
Chloride: 104 mmol/L (ref 98–111)
Creatinine, Ser: 0.86 mg/dL (ref 0.44–1.00)
GFR calc Af Amer: >60 mL/min (ref >60)
GFR calc non Af Amer: >60 mL/min (ref >60)
Glucose, Bld: 87 mg/dL (ref 70–99)
Potassium: 3.7 mmol/L (ref 3.5–5.1)
Sodium: 139 mmol/L (ref 135–145)

## 2018-04-27 LAB — TYPE AND SCREEN
ABO/RH(D): O POS
Antibody Screen: NEGATIVE

## 2018-04-27 NOTE — Progress Notes (Signed)
PCP: Gweneth DimitriWendy [name]  DM: denies  SA: denies  Pt denies SOB, chest pain, cough, fever  Pt stated understanding of instructions given for DOS.

## 2018-05-04 ENCOUNTER — Inpatient Hospital Stay (HOSPITAL_COMMUNITY): Payer: BLUE CROSS/BLUE SHIELD

## 2018-05-04 ENCOUNTER — Encounter (HOSPITAL_COMMUNITY)
Admission: RE | Disposition: A | Discharge status: Home / Self Care | Payer: Self-pay | Source: Ambulatory Visit | Attending: Neurological Surgery

## 2018-05-04 ENCOUNTER — Inpatient Hospital Stay (HOSPITAL_COMMUNITY): Payer: BLUE CROSS/BLUE SHIELD | Admitting: Certified Registered Nurse Anesthetist

## 2018-05-04 ENCOUNTER — Encounter (HOSPITAL_COMMUNITY): Payer: Self-pay | Admitting: Urology

## 2018-05-04 ENCOUNTER — Inpatient Hospital Stay (HOSPITAL_COMMUNITY)
Admission: RE | Admit: 2018-05-04 | Discharge: 2018-05-05 | DRG: 460 | Disposition: A | Discharge status: Home / Self Care | Payer: BLUE CROSS/BLUE SHIELD | Source: Ambulatory Visit | Attending: Neurological Surgery | Admitting: Neurological Surgery

## 2018-05-04 DIAGNOSIS — Z79899 Other long term (current) drug therapy: Secondary | ICD-10-CM | POA: Diagnosis not present

## 2018-05-04 DIAGNOSIS — M48062 Spinal stenosis, lumbar region with neurogenic claudication: Secondary | ICD-10-CM | POA: Diagnosis present

## 2018-05-04 DIAGNOSIS — M5126 Other intervertebral disc displacement, lumbar region: Secondary | ICD-10-CM | POA: Diagnosis not present

## 2018-05-04 DIAGNOSIS — M4316 Spondylolisthesis, lumbar region: Secondary | ICD-10-CM | POA: Diagnosis not present

## 2018-05-04 DIAGNOSIS — E785 Hyperlipidemia, unspecified: Secondary | ICD-10-CM | POA: Diagnosis not present

## 2018-05-04 DIAGNOSIS — Z7982 Long term (current) use of aspirin: Secondary | ICD-10-CM

## 2018-05-04 DIAGNOSIS — M5416 Radiculopathy, lumbar region: Secondary | ICD-10-CM | POA: Diagnosis present

## 2018-05-04 DIAGNOSIS — Z981 Arthrodesis status: Secondary | ICD-10-CM | POA: Diagnosis not present

## 2018-05-04 DIAGNOSIS — Z6841 Body Mass Index (BMI) 40.0 and over, adult: Secondary | ICD-10-CM | POA: Diagnosis not present

## 2018-05-04 DIAGNOSIS — M47816 Spondylosis without myelopathy or radiculopathy, lumbar region: Secondary | ICD-10-CM | POA: Diagnosis not present

## 2018-05-04 DIAGNOSIS — Z419 Encounter for procedure for purposes other than remedying health state, unspecified: Secondary | ICD-10-CM

## 2018-05-04 DIAGNOSIS — M4310 Spondylolisthesis, site unspecified: Secondary | ICD-10-CM | POA: Diagnosis present

## 2018-05-04 HISTORY — PX: ABDOMINAL EXPOSURE: SHX5708

## 2018-05-04 HISTORY — PX: ANTERIOR LUMBAR FUSION: SHX1170

## 2018-05-04 LAB — POCT PREGNANCY, URINE: Preg Test, Ur: NEGATIVE

## 2018-05-04 SURGERY — ANTERIOR LUMBAR FUSION 1 LEVEL
Anesthesia: General | Site: Spine Lumbar

## 2018-05-04 MED ORDER — CEFAZOLIN SODIUM-DEXTROSE 2-4 GM/100ML-% IV SOLN
2.0000 g | INTRAVENOUS | Status: AC
  Administered 2018-05-04: 2 g via INTRAVENOUS
  Filled 2018-05-04: qty 100

## 2018-05-04 MED ORDER — DEXAMETHASONE SODIUM PHOSPHATE 10 MG/ML IJ SOLN
INTRAMUSCULAR | Status: DC | PRN
  Administered 2018-05-04: 10 mg via INTRAVENOUS

## 2018-05-04 MED ORDER — ACETAMINOPHEN 325 MG PO TABS: 650.0000 mg | ORAL_TABLET | ORAL | Status: DC | PRN

## 2018-05-04 MED ORDER — FENTANYL CITRATE (PF) 250 MCG/5ML IJ SOLN
INTRAMUSCULAR | Status: AC
  Filled 2018-05-04: qty 5

## 2018-05-04 MED ORDER — CHLORHEXIDINE GLUCONATE CLOTH 2 % EX PADS: 6.0000 | MEDICATED_PAD | Freq: Once | CUTANEOUS | Status: DC

## 2018-05-04 MED ORDER — MIDAZOLAM HCL 2 MG/2ML IJ SOLN
INTRAMUSCULAR | Status: DC | PRN
  Administered 2018-05-04: 2 mg via INTRAVENOUS

## 2018-05-04 MED ORDER — DEXAMETHASONE SODIUM PHOSPHATE 10 MG/ML IJ SOLN
INTRAMUSCULAR | Status: AC
  Filled 2018-05-04: qty 1

## 2018-05-04 MED ORDER — PHENOL 1.4 % MT LIQD: 1.0000 | OROMUCOSAL | Status: DC | PRN

## 2018-05-04 MED ORDER — PROPOFOL 10 MG/ML IV BOLUS
INTRAVENOUS | Status: DC | PRN
  Administered 2018-05-04: 150 mg via INTRAVENOUS

## 2018-05-04 MED ORDER — PROMETHAZINE HCL 25 MG/ML IJ SOLN: 6.2500 mg | INTRAMUSCULAR | Status: DC | PRN

## 2018-05-04 MED ORDER — ROCURONIUM BROMIDE 50 MG/5ML IV SOSY
PREFILLED_SYRINGE | INTRAVENOUS | Status: AC
  Filled 2018-05-04: qty 10

## 2018-05-04 MED ORDER — CEFAZOLIN SODIUM-DEXTROSE 2-4 GM/100ML-% IV SOLN
INTRAVENOUS | Status: AC
  Filled 2018-05-04: qty 100

## 2018-05-04 MED ORDER — LACTATED RINGERS IV SOLN
INTRAVENOUS | Status: DC
  Administered 2018-05-04 – 2018-05-05 (×2): via INTRAVENOUS

## 2018-05-04 MED ORDER — LACTATED RINGERS IV SOLN
INTRAVENOUS | Status: DC | PRN
  Administered 2018-05-04: 08:00:00 via INTRAVENOUS

## 2018-05-04 MED ORDER — SODIUM CHLORIDE 0.9 % IV SOLN
INTRAVENOUS | Status: DC | PRN
  Administered 2018-05-04: 500 mL

## 2018-05-04 MED ORDER — OXYCODONE-ACETAMINOPHEN 5-325 MG PO TABS
1.0000 | ORAL_TABLET | ORAL | Status: DC | PRN
  Administered 2018-05-05 (×3): 2 via ORAL
  Filled 2018-05-04 (×3): qty 2

## 2018-05-04 MED ORDER — SUGAMMADEX SODIUM 200 MG/2ML IV SOLN
INTRAVENOUS | Status: DC | PRN
  Administered 2018-05-04: 200 mg via INTRAVENOUS

## 2018-05-04 MED ORDER — ROCURONIUM BROMIDE 10 MG/ML (PF) SYRINGE
PREFILLED_SYRINGE | INTRAVENOUS | Status: DC | PRN
  Administered 2018-05-04: 30 mg via INTRAVENOUS
  Administered 2018-05-04: 50 mg via INTRAVENOUS
  Administered 2018-05-04: 20 mg via INTRAVENOUS

## 2018-05-04 MED ORDER — MIDAZOLAM HCL 2 MG/2ML IJ SOLN
INTRAMUSCULAR | Status: AC
  Filled 2018-05-04: qty 2

## 2018-05-04 MED ORDER — CHLORHEXIDINE GLUCONATE 4 % EX LIQD: 60.0000 mL | Freq: Once | CUTANEOUS | Status: DC

## 2018-05-04 MED ORDER — HYDROMORPHONE HCL 1 MG/ML IJ SOLN
INTRAMUSCULAR | Status: AC
  Filled 2018-05-04: qty 1

## 2018-05-04 MED ORDER — NORETHINDRONE 0.35 MG PO TABS: 1.0000 | ORAL_TABLET | Freq: Every day | ORAL | Status: DC

## 2018-05-04 MED ORDER — THROMBIN 20000 UNITS EX SOLR
CUTANEOUS | Status: AC
  Filled 2018-05-04: qty 20000

## 2018-05-04 MED ORDER — KETOROLAC TROMETHAMINE 15 MG/ML IJ SOLN
15.0000 mg | Freq: Four times a day (QID) | INTRAMUSCULAR | Status: AC
  Administered 2018-05-04 – 2018-05-05 (×4): 15 mg via INTRAVENOUS
  Filled 2018-05-04 (×4): qty 1

## 2018-05-04 MED ORDER — SODIUM CHLORIDE 0.9 % IV SOLN
INTRAVENOUS | Status: DC | PRN
  Administered 2018-05-04: 15 ug/min via INTRAVENOUS

## 2018-05-04 MED ORDER — ONDANSETRON HCL 4 MG PO TABS: 4.0000 mg | ORAL_TABLET | Freq: Four times a day (QID) | ORAL | Status: DC | PRN

## 2018-05-04 MED ORDER — MAGNESIUM OXIDE 400 (241.3 MG) MG PO TABS
400.0000 mg | ORAL_TABLET | Freq: Every day | ORAL | Status: DC
  Administered 2018-05-05: 400 mg via ORAL
  Filled 2018-05-04: qty 1

## 2018-05-04 MED ORDER — METHOCARBAMOL 500 MG PO TABS
ORAL_TABLET | ORAL | Status: AC
  Filled 2018-05-04: qty 1

## 2018-05-04 MED ORDER — POLYETHYLENE GLYCOL 3350 17 G PO PACK: 17.0000 g | PACK | Freq: Every day | ORAL | Status: DC | PRN

## 2018-05-04 MED ORDER — MENTHOL 3 MG MT LOZG: 1.0000 | LOZENGE | OROMUCOSAL | Status: DC | PRN

## 2018-05-04 MED ORDER — 0.9 % SODIUM CHLORIDE (POUR BTL) OPTIME
TOPICAL | Status: DC | PRN
  Administered 2018-05-04: 1000 mL

## 2018-05-04 MED ORDER — LIDOCAINE-EPINEPHRINE 1 %-1:100000 IJ SOLN
INTRAMUSCULAR | Status: AC
  Filled 2018-05-04: qty 1

## 2018-05-04 MED ORDER — SENNA 8.6 MG PO TABS
1.0000 | ORAL_TABLET | Freq: Two times a day (BID) | ORAL | Status: DC
  Administered 2018-05-05: 8.6 mg via ORAL
  Filled 2018-05-04: qty 1

## 2018-05-04 MED ORDER — ONDANSETRON HCL 4 MG/2ML IJ SOLN
4.0000 mg | Freq: Four times a day (QID) | INTRAMUSCULAR | Status: DC | PRN
  Administered 2018-05-04: 4 mg via INTRAVENOUS
  Filled 2018-05-04: qty 2

## 2018-05-04 MED ORDER — BISACODYL 10 MG RE SUPP: 10.0000 mg | Freq: Every day | RECTAL | Status: DC | PRN

## 2018-05-04 MED ORDER — TRAMADOL HCL 50 MG PO TABS: 50.0000 mg | ORAL_TABLET | Freq: Every evening | ORAL | Status: DC | PRN

## 2018-05-04 MED ORDER — DOCUSATE SODIUM 100 MG PO CAPS
100.0000 mg | ORAL_CAPSULE | Freq: Two times a day (BID) | ORAL | Status: DC
  Administered 2018-05-04 – 2018-05-05 (×2): 100 mg via ORAL
  Filled 2018-05-04 (×2): qty 1

## 2018-05-04 MED ORDER — FENTANYL CITRATE (PF) 250 MCG/5ML IJ SOLN
INTRAMUSCULAR | Status: DC | PRN
  Administered 2018-05-04 (×8): 50 ug via INTRAVENOUS

## 2018-05-04 MED ORDER — ACETAMINOPHEN 500 MG PO TABS
1000.0000 mg | ORAL_TABLET | Freq: Once | ORAL | Status: AC
  Administered 2018-05-04: 1000 mg via ORAL

## 2018-05-04 MED ORDER — ASPIRIN EC 81 MG PO TBEC
81.0000 mg | DELAYED_RELEASE_TABLET | Freq: Every day | ORAL | Status: DC
  Administered 2018-05-05: 81 mg via ORAL
  Filled 2018-05-04: qty 1

## 2018-05-04 MED ORDER — LIDOCAINE 2% (20 MG/ML) 5 ML SYRINGE
INTRAMUSCULAR | Status: DC | PRN
  Administered 2018-05-04: 100 mg via INTRAVENOUS

## 2018-05-04 MED ORDER — ONDANSETRON HCL 4 MG/2ML IJ SOLN
INTRAMUSCULAR | Status: DC | PRN
  Administered 2018-05-04: 4 mg via INTRAVENOUS

## 2018-05-04 MED ORDER — MORPHINE SULFATE (PF) 2 MG/ML IV SOLN
2.0000 mg | INTRAVENOUS | Status: DC | PRN
  Administered 2018-05-04 (×2): 2 mg via INTRAVENOUS
  Filled 2018-05-04 (×2): qty 1

## 2018-05-04 MED ORDER — THROMBIN 20000 UNITS EX SOLR
CUTANEOUS | Status: DC | PRN
  Administered 2018-05-04: 20 mL via TOPICAL

## 2018-05-04 MED ORDER — DEXMEDETOMIDINE HCL IN NACL 200 MCG/50ML IV SOLN
INTRAVENOUS | Status: DC | PRN
  Administered 2018-05-04: 0.4 ug/kg/h via INTRAVENOUS

## 2018-05-04 MED ORDER — PROPOFOL 10 MG/ML IV BOLUS
INTRAVENOUS | Status: AC
  Filled 2018-05-04: qty 20

## 2018-05-04 MED ORDER — ONDANSETRON HCL 4 MG/2ML IJ SOLN
INTRAMUSCULAR | Status: AC
  Filled 2018-05-04: qty 2

## 2018-05-04 MED ORDER — LACTATED RINGERS IV SOLN
INTRAVENOUS | Status: DC | PRN
  Administered 2018-05-04: 07:00:00 via INTRAVENOUS

## 2018-05-04 MED ORDER — OXYCODONE HCL 5 MG/5ML PO SOLN: 5.0000 mg | Freq: Once | ORAL | Status: AC | PRN

## 2018-05-04 MED ORDER — LIDOCAINE 2% (20 MG/ML) 5 ML SYRINGE
INTRAMUSCULAR | Status: AC
  Filled 2018-05-04: qty 5

## 2018-05-04 MED ORDER — THROMBIN 5000 UNITS EX SOLR
OROMUCOSAL | Status: DC | PRN
  Administered 2018-05-04: 5 mL via TOPICAL

## 2018-05-04 MED ORDER — SODIUM CHLORIDE 0.9% FLUSH: 3.0000 mL | INTRAVENOUS | Status: DC | PRN

## 2018-05-04 MED ORDER — SODIUM CHLORIDE 0.9 % IV SOLN: 250.0000 mL | INTRAVENOUS | Status: DC

## 2018-05-04 MED ORDER — SODIUM CHLORIDE 0.9% FLUSH
3.0000 mL | Freq: Two times a day (BID) | INTRAVENOUS | Status: DC
  Administered 2018-05-04 – 2018-05-05 (×2): 3 mL via INTRAVENOUS

## 2018-05-04 MED ORDER — ACETAMINOPHEN 500 MG PO TABS
ORAL_TABLET | ORAL | Status: AC
  Filled 2018-05-04: qty 2

## 2018-05-04 MED ORDER — METHOCARBAMOL 500 MG PO TABS
500.0000 mg | ORAL_TABLET | Freq: Four times a day (QID) | ORAL | Status: DC | PRN
  Administered 2018-05-04 – 2018-05-05 (×2): 500 mg via ORAL
  Filled 2018-05-04: qty 1

## 2018-05-04 MED ORDER — HYDROMORPHONE HCL 1 MG/ML IJ SOLN
0.2500 mg | INTRAMUSCULAR | Status: DC | PRN
  Administered 2018-05-04 (×4): 0.5 mg via INTRAVENOUS

## 2018-05-04 MED ORDER — BUPIVACAINE HCL (PF) 0.5 % IJ SOLN
INTRAMUSCULAR | Status: AC
  Filled 2018-05-04: qty 30

## 2018-05-04 MED ORDER — THROMBIN 5000 UNITS EX SOLR
CUTANEOUS | Status: AC
  Filled 2018-05-04: qty 5000

## 2018-05-04 MED ORDER — OXYCODONE HCL 5 MG PO TABS
5.0000 mg | ORAL_TABLET | Freq: Once | ORAL | Status: AC | PRN
  Administered 2018-05-04: 5 mg via ORAL

## 2018-05-04 MED ORDER — OXYCODONE HCL 5 MG PO TABS
ORAL_TABLET | ORAL | Status: AC
  Filled 2018-05-04: qty 1

## 2018-05-04 MED ORDER — ACETAMINOPHEN 650 MG RE SUPP: 650.0000 mg | RECTAL | Status: DC | PRN

## 2018-05-04 MED ORDER — METHOCARBAMOL 1000 MG/10ML IJ SOLN
500.0000 mg | Freq: Four times a day (QID) | INTRAVENOUS | Status: DC | PRN
  Filled 2018-05-04: qty 5

## 2018-05-04 MED ORDER — AMITRIPTYLINE HCL 10 MG PO TABS
10.0000 mg | ORAL_TABLET | Freq: Every day | ORAL | Status: DC
  Administered 2018-05-04: 10 mg via ORAL
  Filled 2018-05-04 (×2): qty 1

## 2018-05-04 MED ORDER — FLEET ENEMA 7-19 GM/118ML RE ENEM: 1.0000 | ENEMA | Freq: Once | RECTAL | Status: DC | PRN

## 2018-05-04 MED ORDER — CALCIUM CARBONATE 1250 (500 CA) MG PO TABS
1250.0000 mg | ORAL_TABLET | Freq: Two times a day (BID) | ORAL | Status: DC
  Administered 2018-05-05: 1250 mg via ORAL
  Filled 2018-05-04: qty 1

## 2018-05-04 MED ORDER — VITAMIN D 25 MCG (1000 UNIT) PO TABS
1000.0000 [IU] | ORAL_TABLET | Freq: Every day | ORAL | Status: DC
  Administered 2018-05-05: 1000 [IU] via ORAL
  Filled 2018-05-04: qty 1

## 2018-05-04 SURGICAL SUPPLY — 83 items
APPLIER CLIP 11 MED OPEN (CLIP) ×3
BAG DECANTER FOR FLEXI CONT (MISCELLANEOUS) ×3 IMPLANT
BASKET BONE COLLECTION (BASKET) IMPLANT
BOLT BASE TI 5X17.5 VARIABLE (Bolt) ×9 IMPLANT
BONE MATRIX OSTEOCEL PRO MED (Bone Implant) ×3 IMPLANT
BUR BARREL STRAIGHT FLUTE 4.0 (BURR) ×3 IMPLANT
BUR MATCHSTICK NEURO 3.0 LAGG (BURR) IMPLANT
CAGE FUSION TI 8X34X24 10D (Cage) ×3 IMPLANT
CANISTER SUCT 3000ML PPV (MISCELLANEOUS) ×3 IMPLANT
CLIP APPLIE 11 MED OPEN (CLIP) ×2 IMPLANT
CLIP LIGATING EXTRA MED SLVR (CLIP) IMPLANT
CLIP LIGATING EXTRA SM BLUE (MISCELLANEOUS) IMPLANT
COVER BACK TABLE 60X90IN (DRAPES) ×3 IMPLANT
COVER WAND RF STERILE (DRAPES) IMPLANT
DECANTER SPIKE VIAL GLASS SM (MISCELLANEOUS) IMPLANT
DERMABOND ADVANCED (GAUZE/BANDAGES/DRESSINGS) ×1
DERMABOND ADVANCED .7 DNX12 (GAUZE/BANDAGES/DRESSINGS) ×2 IMPLANT
DRAPE C-ARM 42X72 X-RAY (DRAPES) ×6 IMPLANT
DRAPE LAPAROTOMY 100X72X124 (DRAPES) ×3 IMPLANT
DURAPREP 26ML APPLICATOR (WOUND CARE) ×3 IMPLANT
ELECT BLADE 4.0 EZ CLEAN MEGAD (MISCELLANEOUS) ×3
ELECT REM PT RETURN 9FT ADLT (ELECTROSURGICAL) ×3
ELECTRODE BLDE 4.0 EZ CLN MEGD (MISCELLANEOUS) ×2 IMPLANT
ELECTRODE REM PT RTRN 9FT ADLT (ELECTROSURGICAL) ×2 IMPLANT
GAUZE 4X4 16PLY RFD (DISPOSABLE) IMPLANT
GLOVE BIO SURGEON STRL SZ7.5 (GLOVE) ×3 IMPLANT
GLOVE BIOGEL PI IND STRL 7.5 (GLOVE) ×2 IMPLANT
GLOVE BIOGEL PI IND STRL 8 (GLOVE) IMPLANT
GLOVE BIOGEL PI IND STRL 8.5 (GLOVE) ×2 IMPLANT
GLOVE BIOGEL PI INDICATOR 7.5 (GLOVE) ×1
GLOVE BIOGEL PI INDICATOR 8 (GLOVE)
GLOVE BIOGEL PI INDICATOR 8.5 (GLOVE) ×1
GLOVE ECLIPSE 8.5 STRL (GLOVE) ×3 IMPLANT
GLOVE EXAM NITRILE XL STR (GLOVE) IMPLANT
GLOVE SS BIOGEL STRL SZ 7.5 (GLOVE) ×2 IMPLANT
GLOVE SUPERSENSE BIOGEL SZ 7.5 (GLOVE) ×1
GLOVE SURG SS PI 7.5 STRL IVOR (GLOVE) ×18 IMPLANT
GOWN STRL REUS W/ TWL LRG LVL3 (GOWN DISPOSABLE) ×4 IMPLANT
GOWN STRL REUS W/ TWL XL LVL3 (GOWN DISPOSABLE) ×2 IMPLANT
GOWN STRL REUS W/TWL 2XL LVL3 (GOWN DISPOSABLE) ×3 IMPLANT
GOWN STRL REUS W/TWL LRG LVL3 (GOWN DISPOSABLE) ×2
GOWN STRL REUS W/TWL XL LVL3 (GOWN DISPOSABLE) ×1
HEMOSTAT POWDER KIT SURGIFOAM (HEMOSTASIS) ×3 IMPLANT
INSERT FOGARTY 61MM (MISCELLANEOUS) IMPLANT
INSERT FOGARTY SM (MISCELLANEOUS) IMPLANT
KIT BASIN OR (CUSTOM PROCEDURE TRAY) ×3 IMPLANT
KIT TURNOVER KIT B (KITS) ×3 IMPLANT
LOOP VESSEL MAXI BLUE (MISCELLANEOUS) IMPLANT
LOOP VESSEL MINI RED (MISCELLANEOUS) IMPLANT
NEEDLE HYPO 25X1 1.5 SAFETY (NEEDLE) ×3 IMPLANT
NEEDLE SPNL 18GX3.5 QUINCKE PK (NEEDLE) ×3 IMPLANT
NS IRRIG 1000ML POUR BTL (IV SOLUTION) ×3 IMPLANT
PACK LAMINECTOMY NEURO (CUSTOM PROCEDURE TRAY) ×3 IMPLANT
PAD ARMBOARD 7.5X6 YLW CONV (MISCELLANEOUS) ×12 IMPLANT
SPONGE INTESTINAL PEANUT (DISPOSABLE) ×12 IMPLANT
SPONGE LAP 18X18 X RAY DECT (DISPOSABLE) ×6 IMPLANT
SPONGE LAP 4X18 RFD (DISPOSABLE) IMPLANT
SPONGE SURGIFOAM ABS GEL 100 (HEMOSTASIS) ×3 IMPLANT
STAPLER VISISTAT 35W (STAPLE) ×3 IMPLANT
SUT PDS AB 1 CTX 36 (SUTURE) ×3 IMPLANT
SUT PROLENE 4 0 RB 1 (SUTURE)
SUT PROLENE 4-0 RB1 .5 CRCL 36 (SUTURE) IMPLANT
SUT PROLENE 5 0 CC1 (SUTURE) IMPLANT
SUT PROLENE 6 0 C 1 30 (SUTURE) IMPLANT
SUT PROLENE 6 0 CC (SUTURE) IMPLANT
SUT SILK 0 TIES 10X30 (SUTURE) ×3 IMPLANT
SUT SILK 2 0 TIES 10X30 (SUTURE) ×3 IMPLANT
SUT SILK 2 0SH CR/8 30 (SUTURE) IMPLANT
SUT SILK 3 0 TIES 10X30 (SUTURE) IMPLANT
SUT SILK 3 0SH CR/8 30 (SUTURE) IMPLANT
SUT VIC AB 0 CT1 27 (SUTURE)
SUT VIC AB 0 CT1 27XBRD ANBCTR (SUTURE) IMPLANT
SUT VIC AB 1 CT1 18XBRD ANBCTR (SUTURE) ×2 IMPLANT
SUT VIC AB 1 CT1 8-18 (SUTURE) ×1
SUT VIC AB 2-0 CP2 18 (SUTURE) ×6 IMPLANT
SUT VIC AB 2-0 CT1 18 (SUTURE) ×6 IMPLANT
SUT VIC AB 3-0 SH 8-18 (SUTURE) ×9 IMPLANT
SUT VIC AB 4-0 PS2 27 (SUTURE) ×3 IMPLANT
SUT VICRYL 4-0 PS2 18IN ABS (SUTURE) IMPLANT
TOWEL GREEN STERILE (TOWEL DISPOSABLE) ×3 IMPLANT
TOWEL GREEN STERILE FF (TOWEL DISPOSABLE) ×3 IMPLANT
TRAY FOLEY MTR SLVR 16FR STAT (SET/KITS/TRAYS/PACK) ×3 IMPLANT
WATER STERILE IRR 1000ML POUR (IV SOLUTION) ×3 IMPLANT

## 2018-05-04 NOTE — Anesthesia Postprocedure Evaluation (Signed)
Anesthesia Post Note  Patient: Sherian MaroonBrenda C Persad  Procedure(s) Performed: Lumbar Four-Five Anterior  lumbar interbody fusion (N/A Spine Lumbar) ABDOMINAL EXPOSURE (N/A Abdomen)     Patient location during evaluation: PACU Anesthesia Type: General Level of consciousness: awake and alert Pain management: pain level controlled Vital Signs Assessment: post-procedure vital signs reviewed and stable Respiratory status: spontaneous breathing, nonlabored ventilation, respiratory function stable and patient connected to nasal cannula oxygen Cardiovascular status: blood pressure returned to baseline and stable Postop Assessment: no apparent nausea or vomiting Anesthetic complications: no    Last Vitals:  Vitals:   05/04/18 1609 05/04/18 1640  BP: 127/65 124/68  Pulse: 77 78  Resp: 17 18  Temp: (!) 36.3 C 36.8 C  SpO2: 96% 97%    Last Pain:  Vitals:   05/04/18 1700  TempSrc:   PainSc: 5                  Ryan P Ellender

## 2018-05-04 NOTE — Plan of Care (Signed)
Patient stable, discussed POC with patient and spouse, agreeable with plan, denies question/concerns at this time.  

## 2018-05-04 NOTE — H&P (Signed)
History and Physical Interval Note:  05/04/2018 7:17 AM  Kelsey [name]  has presented today for surgery, with the diagnosis of Spinal stenosis of lumbar region with neurogenic claudication  The various methods of treatment have been discussed with the patient and family. After consideration of risks, benefits and other options for treatment, the patient has consented to  Procedure(s) with comments: Lumbar 4-5 Anterior  lumbar interbody fusion (N/A) - Lumbar 4-5 Anterior lateral lumbar interbody fusion ABDOMINAL EXPOSURE (N/A) as a surgical intervention .  The patient's history has been reviewed, patient examined, no change in status, stable for surgery.  I have reviewed the patient's chart and labs.  Questions were answered to the patient's satisfaction.    L4-L5 ALIF  Cephus Shellinghristopher J Manolito Jurewicz  REASON FOR CONSULT: L4-L5 ALIF  HPI: Kelsey [name] is a 52 y.o. female, with history of hyperlipidemia and infertility that presents for preoperative evaluation for L4-L5 Alif with Dr. Danielle DessElsner.  Patient states that she has had shooting pain down her left leg with radiculopathy for some time that has become unbearable.  States her left leg is far more symptomatic than her right leg.  She has been evaluated by Dr. Danielle DessElsner who has noted degenerative spondylolisthesis at the L4-L5 level.  She was in physical therapy with minimal improvement.  She denies any previous abdominal surgery.  She denies any blood thinners.  She states her only other surgical history is a left labrum operation years ago.      Past Medical History:  Diagnosis Date  . Abnormal Pap smear   . Bulging lumbar disc    L4, l5--scheduled for surgery 05-04-18--Dr. Elsner  . Hyperlipidemia   . Infertility, female    no method of birth from late 20's through the 4840's,  Headaches on OCP  . Migraine    with aura    Past Surgical History:  Procedure Laterality Date  . ANKLE SURGERY  1998/1999   left ankle fracture with ORIF  .  CARPAL TUNNEL RELEASE  05/21/12   right wrist  . COLPOSCOPY     CIN I  . HIP SURGERY Left 03-06-13   [facility] Torn Labrum from injury  . LASIK    . MOLE REMOVAL  2000   off back - benign         Family History  Problem Relation Age of Onset  . Diabetes Mother   . Alzheimer's disease Mother   . Hypertension Father   . Diabetes Father   . Heart disease Father   . Heart failure Father   . Hypertension Brother   . Colon cancer Brother [phone]       Colectomy with Appendectomy  . Diabetes Brother   . Alcohol abuse Brother   . Alzheimer's disease Maternal Grandmother   . Alzheimer's disease Maternal Aunt     SOCIAL HISTORY: Social History        Socioeconomic History  . Marital status: Married    Spouse name: Not on file  . Number of children: Not on file  . Years of education: Not on file  . Highest education level: Not on file  Occupational History  . Not on file  Social Needs  . Financial resource strain: Not on file  . Food insecurity:    Worry: Not on file    Inability: Not on file  . Transportation needs:    Medical: Not on file    Non-medical: Not on file  Tobacco Use  . Smoking status:  Never Smoker  . Smokeless tobacco: Never Used  Substance and Sexual Activity  . Alcohol use: Yes    Alcohol/week: 1.0 standard drinks    Types: 1 Glasses of wine per week    Comment: 1 month  . Drug use: No  . Sexual activity: Yes    Partners: Male    Birth control/protection: Pill    Comment: Norethindrone  Lifestyle  . Physical activity:    Days per week: Not on file    Minutes per session: Not on file  . Stress: Not on file  Relationships  . Social connections:    Talks on phone: Not on file    Gets together: Not on file    Attends religious service: Not on file    Active member of club or organization: Not on file    Attends meetings of clubs or organizations: Not on file    Relationship  status: Not on file  . Intimate partner violence:    Fear of current or ex partner: Not on file    Emotionally abused: Not on file    Physically abused: Not on file    Forced sexual activity: Not on file  Other Topics Concern  . Not on file  Social History Narrative  . Not on file         Allergies  Allergen Reactions  . Lodine [Etodolac]     hives          Current Outpatient Medications  Medication Sig Dispense Refill  . amitriptyline (ELAVIL) 10 MG tablet Take 1 tablet by mouth at bedtime.  3  . aspirin EC 81 MG tablet Take 81 mg by mouth daily.    . calcium carbonate (CALCIUM 600) 600 MG TABS tablet Take 600 mg by mouth 2 (two) times daily with a meal.    . doxycycline (VIBRAMYCIN) 100 MG capsule Take 100 mg by mouth 2 (two) times daily.    . Magnesium 500 MG CAPS Take 1 capsule by mouth daily.    . Multiple Vitamins-Minerals (CENTRUM ULTRA WOMENS PO) Take 1 tablet by mouth daily.    . norethindrone (CAMILA) 0.35 MG tablet Take 1 tablet (0.35 mg total) by mouth daily. 3 Package 3  . Omega-3 Fatty Acids (FISH OIL) 1200 MG CAPS Take 1 capsule by mouth daily.    . traMADol (ULTRAM) 50 MG tablet Take 1 tablet by mouth as needed.  0  . vitamin B-12 (CYANOCOBALAMIN) 500 MCG tablet Take 500 mcg by mouth daily.    . vitamin C (ASCORBIC ACID) 250 MG tablet Take 250 mg by mouth daily.    . Vitamin D, Ergocalciferol, (DRISDOL) 50000 units CAPS capsule Take 1 capsule (50,000 Units total) by mouth every 7 (seven) days. 30 capsule 2  . doxycycline (VIBRA-TABS) 100 MG tablet TAKE 1 TABLET BY MOUTH TWICE A DAY WITH FOOD **WATCH SUN**  3   No current facility-administered medications for this visit.     REVIEW OF SYSTEMS:  [X]  denotes positive finding, [ ]  denotes negative finding Cardiac  Comments:  Chest pain or chest pressure:    Shortness of breath upon exertion:    Short of breath when lying flat:    Irregular heart rhythm:         Vascular    Pain in calf, thigh, or hip brought on by ambulation:    Pain in feet at night that wakes you up from your sleep:     Blood clot in your veins:  Leg swelling:         Pulmonary    Oxygen at home:    Productive cough:     Wheezing:         Neurologic    Sudden weakness in arms or legs:     Sudden numbness in arms or legs:  x Left leg  Sudden onset of difficulty speaking or slurred speech:    Temporary loss of vision in one eye:     Problems with dizziness:         Gastrointestinal    Blood in stool:     Vomited blood:         Genitourinary    Burning when urinating:     Blood in urine:        Psychiatric    Major depression:         Hematologic    Bleeding problems:    Problems with blood clotting too easily:        Skin    Rashes or ulcers:        Constitutional    Fever or chills:      PHYSICAL EXAM:    Vitals:   04/17/18 0830  BP: (!) 169/95  Pulse: 75  Resp: 16  Temp: (!) 97.3 F (36.3 C)  TempSrc: Oral  SpO2: 98%  Weight: 231 lb (104.8 kg)  Height: 5\' 4"  (1.626 m)    GENERAL: The patient is a well-nourished female, in no acute distress. The vital signs are documented above. CARDIAC: There is a regular rate and rhythm.  VASCULAR:  2+ radial pulse palpable bilateral upper extremities 2+ femoral pulse palpable bilaterally 2+ DP/PT palpable bilateral lower extremities PULMONARY: There is good air exchange bilaterally without wheezing or rales. ABDOMEN: Obese, Soft and non-tender with normal pitched bowel sounds, no previous incisions. MUSCULOSKELETAL: There are no major deformities or cyanosis. NEUROLOGIC: No focal weakness or paresthesias are detected. SKIN: There are no ulcers or rashes noted. PSYCHIATRIC: The patient has a normal affect.  DATA:   I do not have her MRI to review at this time.  Will attempt to get her  MRI.  Assessment/Plan:  52 year old female with degenerative spondylolisthesis at the L4-L5 level that presents for preoperative evaluation for anterior spine exposure at the L4-L5 level with Dr. Danielle Dess.  After evaluation of the patient I think she would be a very reasonable candidate for surgery that is scheduled on 05/04/2018.  I will attempt to obtain her MRI just to review for completeness.  We did discuss risks and benefits of surgery including risk of bowel injury, injury to artery or vein, risk of bleeding, injury to adjacent structures, ileus, etc.  Also discussed with BMI of 40 will be a more difficult spinal exposure.     Cephus Shelling, MD [facility] [place] Office: [phone] Pager: [phone]   Cephus Shelling

## 2018-05-04 NOTE — Anesthesia Procedure Notes (Signed)
Procedure Name: Intubation Date/Time: 05/04/2018 7:53 AM Performed by: Clearnce Sorrel, CRNA Pre-anesthesia Checklist: Patient identified, Emergency Drugs available, Suction available, Patient being monitored and Timeout performed Patient Re-evaluated:Patient Re-evaluated prior to induction Oxygen Delivery Method: Circle system utilized Preoxygenation: Pre-oxygenation with 100% oxygen Induction Type: IV induction Ventilation: Mask ventilation without difficulty Laryngoscope Size: Mac and 4 Grade View: Grade I Tube type: Oral Tube size: 7.0 mm Number of attempts: 1 Airway Equipment and Method: Stylet Placement Confirmation: ETT inserted through vocal cords under direct vision,  positive ETCO2 and breath sounds checked- equal and bilateral Secured at: 22 cm Tube secured with: Tape Dental Injury: Teeth and Oropharynx as per pre-operative assessment

## 2018-05-04 NOTE — Op Note (Signed)
Date: May 04, 2018  Preoperative diagnosis: Spondylolisthesis L4-L5  Postoperative diagnosis: Same  Procedure: Anterior spine exposure of the L4-L5 disc space using left retroperitoneal approach  Surgeon: Dr. Cephus Shellinghristopher J. Angeles Paolucci, MD  Co-surgeon: Dr. Barnett AbuHenry Elsner, MD  Indications: Patient is a 52 year old female who recently presented with spondylolisthesis of the L4-L5 disc space with significant back problems and leg pain.  She was evaluated by Dr. Danielle DessElsner who recommended anterior surgical approach for spine surgery.  She presents today for anterior spine exposure after risks and benefits were discussed including bleeding, infection, vessel injury, need for additional operation etc.  Findings: Left paramedian incision with left retroperitoneal exposure of the L4-L5 disc space.  This did require ligation of two iliolumbar branches.  Details: The patient was taken to the operating room after informed consent was obtained.  She was placed on the operating table in the supine position and her abdomen was prepped and draped in the standard sterile fashion.  Preop timeout was performed to identify patient, procedure, and site.  Initially placed a paramedian incision along the middle of the left rectus muscle.  Ultimately the anterior rectus fascia was mobilized laterally until we could identify the lateral border of the rectus muscle.  We then used blunt dissection with a Kd and found the posterior sheath above arcuate line and the peritoneum was mobilized off the posterior sheath and then the posterior sheath was opened with Metzenbaum scissors.  Then entered the left retroperitoneal space with blunt dissection using a Kd and suction.  Once this was fully mobilized medially to where we could identify the psoas and the left iliac artery we placed a lap sponge in the superior dissection with a Balfour retractor.  I then used a Kd and blunt dissector with suction and mobilized medially to identify the  left iliac artery and this was mobilized medially where identify the left iliac vein and then two iliolumbar branches were ligated with 2-0 silk ties and vessel clips and divided.  Ultimately the left iliac vein and artery were mobilized across the midline where we can get a Thompson retractor in place using 150 cm reverse lip retractors medially and laterally and 190 cm malleable retractors superiorly and inferiorly to identify the L4-L5 disc space.  Initially placed the needle in the disc space and performed a lateral shot that showed we were in the L5-S1 to space so we mobilized a little bit higher with our blunt dissection and repositioned retractor and then successful identified the L4-L5 to space under fluoroscopic guidance.  At this point in time I turned the case over to Dr. Barnett AbuHenry Elsner.  Complications: None  Condition: Stable  Cephus Shellinghristopher J. Jourdan Durbin, MD [facility] [place] Office: [phone] Pager: [phone]  Cephus Shellinghristopher J Izzy Doubek

## 2018-05-04 NOTE — Progress Notes (Signed)
Patient ID: Kelsey MaroonBrenda C Salahuddin, female   DOB: 1965-06-08, 52 y.o.   MRN: [id number] Vital signs are stable Motor function appears intact Incision is clean and dry Resting comfortably Having some nausea

## 2018-05-04 NOTE — Anesthesia Procedure Notes (Signed)
Arterial Line Insertion Start/End12/20/2019 8:00 AM, 05/04/2018 8:02 AM Performed by: Rachel MouldsLee, Heather B, CRNA, CRNA  Patient location: Pre-op. Preanesthetic checklist: patient identified, IV checked, site marked, risks and benefits discussed, surgical consent, monitors and equipment checked, pre-op evaluation, timeout performed and anesthesia consent Lidocaine 1% used for infiltration Left, radial was placed Catheter size: 20 Fr Hand hygiene performed  and maximum sterile barriers used   Attempts: 1 Procedure performed without using ultrasound guided technique. Following insertion, dressing applied. Post procedure assessment: normal and unchanged

## 2018-05-04 NOTE — Transfer of Care (Signed)
Immediate Anesthesia Transfer of Care Note  Patient: Kelsey [name]  Procedure(s) Performed: Lumbar Four-Five Anterior  lumbar interbody fusion (N/A Spine Lumbar) ABDOMINAL EXPOSURE (N/A Abdomen)  Patient Location: PACU  Anesthesia Type:General  Level of Consciousness: sedated  Airway & Oxygen Therapy: Patient Spontanous Breathing and Patient connected to nasal cannula oxygen  Post-op Assessment: Report given to RN and Post -op Vital signs reviewed and stable  Post vital signs: Reviewed and stable  Last Vitals:  Vitals Value Taken Time  BP 113/58 05/04/2018 11:37 AM  Temp    Pulse 85 05/04/2018 11:38 AM  Resp 18 05/04/2018 11:38 AM  SpO2 98 % 05/04/2018 11:38 AM  Vitals shown include unvalidated device data.  Last Pain:  Vitals:   05/04/18 0606  TempSrc: Oral  PainSc:          Complications: No apparent anesthesia complications

## 2018-05-04 NOTE — H&P (Signed)
Kelsey [name] is an 52 y.o. female.   Chief Complaint: Back pain and bilateral leg pain, spondylolisthesis L4-L5 HPI: Kelsey [name] is a 52 year old individual whose had significant problems with back and bilateral leg pain.  She is been found to have a degenerative spondylolisthesis at L4-L5 with by foraminal stenosis for the L4 nerve roots.  She has tried all manner of conservative therapy and over the last 6 months is only experience worsening pain particularly aggravated by activity.  After careful consideration we discussed surgical options to decompress and stabilize L4-L5 and after thorough discussion of the options it was decided that an anterior lumbar interbody arthrodesis at L4-L5 should help to stabilize and decompress her lumbar spine she is now admitted for that procedure.  Past Medical History:  Diagnosis Date  . Abnormal Pap smear   . Bulging lumbar disc    L4, l5--scheduled for surgery 05-04-18--Dr. Nahum Sherrer  . Hyperlipidemia   . Infertility, female    no method of birth from late 20's through the 3140's,  Headaches on OCP  . Migraine    with aura    Past Surgical History:  Procedure Laterality Date  . ANKLE SURGERY  1998/1999   left ankle fracture with ORIF  . CARPAL TUNNEL RELEASE  05/21/12   right wrist  . COLPOSCOPY     CIN I  . HIP SURGERY Left 03-06-13   [facility] - Torn Labrum from injury  . LASIK    . MOLE REMOVAL  2000   off back - benign    Family History  Problem Relation Age of Onset  . Diabetes Mother   . Alzheimer's disease Mother   . Hypertension Father   . Diabetes Father   . Heart disease Father   . Heart failure Father   . Hypertension Brother   . Colon cancer Brother [phone]       Colectomy with Appendectomy  . Diabetes Brother   . Alcohol abuse Brother   . Alzheimer's disease Maternal Grandmother   . Alzheimer's disease Maternal Aunt    Social History:  reports that she has never smoked. She has never used smokeless tobacco. She reports  current alcohol use of about 1.0 standard drinks of alcohol per week. She reports that she does not use drugs.  Allergies:  Allergies  Allergen Reactions  . Lodine [Etodolac] Hives         Medications Prior to Admission  Medication Sig Dispense Refill  . amitriptyline (ELAVIL) 10 MG tablet Take 10 mg by mouth at bedtime.   3  . calcium carbonate (CALCIUM 600) 600 MG TABS tablet Take 600 mg by mouth 2 (two) times daily with a meal.    . cholecalciferol (VITAMIN D3) 25 MCG (1000 UT) tablet Take 1,000 Units by mouth daily.    Marland Kitchen. doxycycline (VIBRA-TABS) 100 MG tablet Take 100 mg by mouth 2 (two) times daily as needed (irritation).   3  . Magnesium 500 MG CAPS Take 1 capsule by mouth daily.    . Multiple Vitamins-Minerals (CENTRUM ULTRA WOMENS PO) Take 1 tablet by mouth daily.    . norethindrone (CAMILA) 0.35 MG tablet Take 1 tablet (0.35 mg total) by mouth daily. 3 Package 3  . traMADol (ULTRAM) 50 MG tablet Take 50 mg by mouth at bedtime as needed (pain).   0  . vitamin B-12 (CYANOCOBALAMIN) 500 MCG tablet Take 500 mcg by mouth daily.    . vitamin C (ASCORBIC ACID) 250 MG tablet Take 250 mg  by mouth daily.    Marland Kitchen. aspirin EC 81 MG tablet Take 81 mg by mouth daily.    . Omega-3 Fatty Acids (FISH OIL) 1200 MG CAPS Take 1 capsule by mouth daily.      Results for orders placed or performed during the hospital encounter of 05/04/18 (from the past 48 hour(s))  Pregnancy, urine POC     Status: None   Collection Time: 05/04/18  5:49 AM  Result Value Ref Range   Preg Test, Ur NEGATIVE NEGATIVE    Comment:        THE SENSITIVITY OF THIS METHODOLOGY IS >24 mIU/mL    No results found.  Review of Systems  Constitutional: Negative.   HENT: Negative.   Eyes: Negative.   Respiratory: Negative.   Cardiovascular: Negative.   Gastrointestinal: Negative.   Genitourinary: Negative.   Musculoskeletal: Positive for back pain.  Skin: Negative.   Neurological: Positive for tingling, sensory change  and focal weakness.  Endo/Heme/Allergies: Negative.   Psychiatric/Behavioral: Negative.     Blood pressure 131/73, pulse 72, temperature 98.1 F (36.7 C), temperature source Oral, resp. rate 18, height 5\' 4"  (1.626 m), last menstrual period 04/10/2018, SpO2 98 %. Physical Exam  Constitutional: She is oriented to person, place, and time. She appears well-developed and well-nourished.  HENT:  Head: Normocephalic and atraumatic.  Eyes: Pupils are equal, round, and reactive to light. Conjunctivae and EOM are normal.  Neck: Normal range of motion. Neck supple.  Respiratory: Effort normal and breath sounds normal.  GI: Soft. Bowel sounds are normal.  Musculoskeletal:     Comments: Normal range of motion with positive straight leg raising at 45 degrees in either lower extremities Patrick's maneuver is negative bilaterally  Neurological: She is alert and oriented to person, place, and time.  Mild tibialis anterior weakness in the left and the right at 4+ out of 5 deep tendon reflexes are 2+ and symmetric in the patella and the Achilles.  Cranial nerve examination is normal.  Skin: Skin is warm and dry.  Psychiatric: She has a normal mood and affect. Her behavior is normal. Judgment and thought content normal.     Assessment/Plan Spondylolisthesis L4-L5 with lumbar radiculopathy  Plan: Decompression fusion L4L5 anterior lumbar interbody arthrodesis  Stefani DamaHenry J Moroni Nester, MD 05/04/2018, 7:46 AM

## 2018-05-04 NOTE — Anesthesia Preprocedure Evaluation (Addendum)
Anesthesia Evaluation  Patient identified by MRN, date of birth, ID band Patient awake    Reviewed: Allergy & Precautions, NPO status , Patient's Chart, lab work & pertinent test results  Airway Mallampati: II  TM Distance: >3 FB Neck ROM: Full    Dental no notable dental hx.    Pulmonary neg pulmonary ROS,    Pulmonary exam normal breath sounds clear to auscultation       Cardiovascular negative cardio ROS Normal cardiovascular exam Rhythm:Regular Rate:Normal  ECG: NSR, rate 64   Neuro/Psych  Headaches, negative psych ROS   GI/Hepatic negative GI ROS, Neg liver ROS,   Endo/Other  Morbid obesity  Renal/GU negative Renal ROS     Musculoskeletal negative musculoskeletal ROS (+)   Abdominal (+) + obese,   Peds  Hematology HLD   Anesthesia Other Findings Spinal stenosis of lumbar region with neurogenic claudication  Reproductive/Obstetrics hcg negative                            Anesthesia Physical Anesthesia Plan  ASA: II  Anesthesia Plan: General   Post-op Pain Management:    Induction: Intravenous  PONV Risk Score and Plan: 4 or greater and Ondansetron, Dexamethasone, Midazolam and Treatment may vary due to age or medical condition  Airway Management Planned: Oral ETT  Additional Equipment: Arterial line  Intra-op Plan:   Post-operative Plan: Extubation in OR  Informed Consent: I have reviewed the patients History and Physical, chart, labs and discussed the procedure including the risks, benefits and alternatives for the proposed anesthesia with the patient or authorized representative who has indicated his/her understanding and acceptance.   Dental advisory given  Plan Discussed with: CRNA  Anesthesia Plan Comments:        Anesthesia Quick Evaluation

## 2018-05-04 NOTE — Op Note (Signed)
Date of surgery: 05/04/2018 Preoperative diagnosis: Spondylosis with spondylolisthesis at L4-L5 lumbar radiculopathy Postoperative diagnosis: Same Procedure: Anterior lumbar decompression L4-L5 arthrodesis with anterior spacer and allograft L4-L5. Surgeon: Barnett AbuHenry Maryellen Dowdle Approach: Clotilde Dieterhris Clark, MD Anesthesia: General endotracheal Indications: Kelsey BertholdBrenda [name] is a 52 year old individual who has a spondylolisthesis at L4-L5.  She had a chronic radiculopathy and was advised that ultimately she may require surgical decompression and arthrodesis at the level of L4-L5.  We discussed various techniques by which this could be done and was felt that an anterior lumbar interbody arthrodesis would best serve this purpose.  He is now taken to the operating room for this procedure.  Procedure: The patient was brought to the operating room and placed on the table in supine position.  After the smooth induction of general tracheal anesthesia and marking the L4-5 space ventrally on the skin the procedure was started by Dr. Clotilde Dieterhris Clark who performed the approach to the retroperitoneal space mobilizing the great vessels off of the L4-5 space.  Once retractors were placed along either side of L4 and L5 I open the ventral aspect of the disc space with a #15 blade.  Combination of curettes and rongeurs and Cobb elevators were used to release the disc from the endplate and remove the disc from the ventral aspect.  Then the interspace was explored and further discectomy was performed achieving a complete discectomy posteriorly the ligament itself was allowed to remain intact however some osteophytosis that was forming from the inferior margin of the body of L4 and the superior margin of L5 was taken down with a 2 mm and 3 mm Kerrison punch.  The dissection was then carried out laterally to expose the extraforaminal portions of the disc space.  This was decompressed also.  Once a complete discectomy was completed the endplates were  shaved smooth to remove all cartilaginous endplate material this was done with a high-speed bur and a 4 mm barrel bit.  Then the interspace was sized for appropriate size spacer and it was felt that an 8 mm tall 34 mm wide 24 mm deep spacer with 10 degrees of lordosis would fit best into this interval.  Three 5 x 17.5 mm screws were used to secure the interbody spacer to the L4 and L5 vertebrae.  Once this was secured final radiographs were obtained Oxycel allograft was used in the interspace to achieve and allow for arthrodesis.  Once the spacers position was verified the retractors were gently removed the space was checked for hemostasis and then the anterior rectus sheath was closed with #1 Vicryl in a running fashion.  A 2-0 Vicryl was used in the subcutaneous tissues and 3-0 Vicryl was used in the deep subcuticular space and 4-0 Monocryl was used in the superficial subcuticular space.  Dermabond was placed on the skin.  Blood loss for the procedure was estimated at approximately 100 cc.

## 2018-05-05 ENCOUNTER — Other Ambulatory Visit: Payer: Self-pay

## 2018-05-05 MED ORDER — METHOCARBAMOL 500 MG PO TABS: 500.0000 mg | ORAL_TABLET | Freq: Four times a day (QID) | ORAL | 1 refills | Status: AC | PRN

## 2018-05-05 MED ORDER — OXYCODONE-ACETAMINOPHEN 5-325 MG PO TABS: 1.0000 | ORAL_TABLET | Freq: Four times a day (QID) | ORAL | 0 refills | Status: DC | PRN

## 2018-05-05 NOTE — Plan of Care (Signed)

## 2018-05-05 NOTE — Progress Notes (Signed)
Patient educated on discharge instructions, IV removed, taken to discharge area by wheelchair. No questions or concerns reported by patient.

## 2018-05-05 NOTE — Progress Notes (Signed)
[facility]  Subjective  - No acute events overnight.   Objective (!) 104/55 76 98.1 F (36.7 C) (Oral) 19 100%  Intake/Output Summary (Last 24 hours) at 05/05/2018 0814 Last data filed at 05/05/2018 0630 Gross per 24 hour  Intake 1724.13 ml  Output 3510 ml  Net -1785.87 ml    Left paramedian incision c/d/i No overt abdominal distention Palpable DP pulses BLE, feet warm  Laboratory Lab Results: No results for input(s): WBC, HGB, HCT, PLT in the last 72 hours. BMET No results for input(s): NA, K, CL, CO2, GLUCOSE, BUN, CREATININE, CALCIUM in the last 72 hours.  COAG No results found for: INR, PROTIME No results found for: PTT  Assessment/Planning:  Doing well POD#1 s/p L4-L5 ALIF.  Advance diet as tolerated. Feet warm and well perfused with palpable DP pulses.    Kelsey [name] 05/05/2018 8:14 AM --

## 2018-05-05 NOTE — Progress Notes (Signed)
CSW was consulted by MD. After reviewing the notes, PT and OT have no follow up for the patient. CSW signing off.   Drucilla Schmidtaitlin Lelia Jons, MSW, LCSW-A Clinical Social Worker [facility]

## 2018-05-05 NOTE — Progress Notes (Signed)
Subjective: Patient reports doing well.  Wants to go home.  Objective: Vital signs in last 24 hours: Temp:  [97.4 F (36.3 C)-98.5 F (36.9 C)] 98.1 F (36.7 C) (12/21 0727) Pulse Rate:  [72-87] 76 (12/21 0727) Resp:  [16-19] 19 (12/21 0727) BP: (104-127)/(55-68) 104/55 (12/21 0727) SpO2:  [96 %-100 %] 100 % (12/21 0727)  Intake/Output from previous day: 12/20 0701 - 12/21 0700 In: 1724.1 [I.V.:1524.1] Out: 3810 [Urine:3710; Blood:100] Intake/Output this shift: No intake/output data recorded.  Physical Exam: Full strength both legs.  Dressing CDI.  Abdomen soft.  Lab Results: No results for input(s): WBC, HGB, HCT, PLT in the last 72 hours. BMET No results for input(s): NA, K, CL, CO2, GLUCOSE, BUN, CREATININE, CALCIUM in the last 72 hours.  Studies/Results: Dg Lumbar Spine 2-3 Views  Result Date: 05/04/2018 CLINICAL DATA:  L4-5 anterior fusion. EXAM: LUMBAR SPINE - 2-3 VIEW; DG C-ARM 61-120 MIN COMPARISON:  None. FINDINGS: Two views show anterior lumbar discectomy and fusion procedure at L4-5. Components appear well positioned. No radiographically detectable complication. IMPRESSION: Anterior lumbar discectomy and fusion at L4-5. Electronically Signed   By: Paulina FusiMark  Shogry M.D.   On: 05/04/2018 11:51   Dg C-arm 1-60 Min  Result Date: 05/04/2018 CLINICAL DATA:  L4-5 anterior fusion. EXAM: LUMBAR SPINE - 2-3 VIEW; DG C-ARM 61-120 MIN COMPARISON:  None. FINDINGS: Two views show anterior lumbar discectomy and fusion procedure at L4-5. Components appear well positioned. No radiographically detectable complication. IMPRESSION: Anterior lumbar discectomy and fusion at L4-5. Electronically Signed   By: Paulina FusiMark  Shogry M.D.   On: 05/04/2018 11:51   Dg Or Local Abdomen  Result Date: 05/04/2018 CLINICAL DATA:  Follow-up anterior L4-5 fusion, assess for retained instruments EXAM: OR LOCAL ABDOMEN COMPARISON:  None. FINDINGS: Scattered large and small bowel gas is noted. Postsurgical changes  are noted at L4-5. Multiple surgical clips are seen. No radiopaque foreign body is identified. IMPRESSION: Postsurgical changes are noted.  No radiopaque foreign body is seen. Critical Value/emergent results were called by telephone at the time of interpretation on 05/04/2018 at 11:03 am to Dory Day the circulating nurse in OR 18, who verbally acknowledged these results and will communicate to Dr. Danielle DessElsner. Electronically Signed   By: Alcide CleverMark  Lukens M.D.   On: 05/04/2018 11:03    Assessment/Plan: Patient is doing well and wants to go home.  Discharge with po percocet and robaxin.    LOS: 1 day    Dorian HeckleJoseph D Caliana Spires, MD 05/05/2018, 3:27 PM

## 2018-05-05 NOTE — Discharge Summary (Signed)
Physician Discharge Summary  Patient ID: Kelsey [name] MRN: [id number] DOB/AGE: 51-10-1965 51 y.o.  Admit date: 05/04/2018 Discharge date: 05/05/2018  Admission Diagnoses:Spondylosis with spondylolisthesis at L4-L5 lumbar radiculopathy  Discharge Diagnoses: Spondylosis with spondylolisthesis at L4-L5 lumbar radiculopathy Active Problems:   Spondylolisthesis of lumbar region   Discharged Condition: good  Hospital Course: Patient underwent Anterior lumbar decompression L4-L5 arthrodesis with anterior spacer and allograft L4-L5. With Drs. Elsner and Clark.  She did well with surgery and was mobilizing without difficulty later on POD 1 and was discharged home.  Consults: None  Significant Diagnostic Studies: None  Treatments: surgery: Anterior lumbar decompression L4-L5 arthrodesis with anterior spacer and allograft L4-L5.  Discharge Exam: Blood pressure (!) 104/55, pulse 76, temperature 98.1 F (36.7 C), temperature source Oral, resp. rate 19, height 5\' 4"  (1.626 m), last menstrual period 04/10/2018, SpO2 100 %. Neurologic: Alert and oriented X 3, normal strength and tone. Normal symmetric reflexes. Normal coordination and gait Wound:CDI  Disposition: Home  Discharge Instructions    Diet - low sodium heart healthy   Complete by:  As directed    Incentive spirometry RT   Complete by:  As directed    Increase activity slowly   Complete by:  As directed      Allergies as of 05/05/2018      Reactions   Lodine [etodolac] Hives         Medication List    TAKE these medications   amitriptyline 10 MG tablet Commonly known as:  ELAVIL Take 10 mg by mouth at bedtime.   aspirin EC 81 MG tablet Take 81 mg by mouth daily.   CALCIUM 600 600 MG Tabs tablet Generic drug:  calcium carbonate Take 600 mg by mouth 2 (two) times daily with a meal.   CENTRUM ULTRA WOMENS PO Take 1 tablet by mouth daily.   cholecalciferol 25 MCG (1000 UT) tablet Commonly known as:  VITAMIN  D3 Take 1,000 Units by mouth daily.   doxycycline 100 MG tablet Commonly known as:  VIBRA-TABS Take 100 mg by mouth 2 (two) times daily as needed (irritation).   Fish Oil 1200 MG Caps Take 1 capsule by mouth daily.   Magnesium 500 MG Caps Take 1 capsule by mouth daily.   methocarbamol 500 MG tablet Commonly known as:  ROBAXIN Take 1 tablet (500 mg total) by mouth every 6 (six) hours as needed for muscle spasms.   norethindrone 0.35 MG tablet Commonly known as:  CAMILA Take 1 tablet (0.35 mg total) by mouth daily.   oxyCODONE-acetaminophen 5-325 MG tablet Commonly known as:  PERCOCET/ROXICET Take 1-2 tablets by mouth every 6 (six) hours as needed for moderate pain or severe pain.   traMADol 50 MG tablet Commonly known as:  ULTRAM Take 50 mg by mouth at bedtime as needed (pain).   vitamin B-12 500 MCG tablet Commonly known as:  CYANOCOBALAMIN Take 500 mcg by mouth daily.   vitamin C 250 MG tablet Commonly known as:  ASCORBIC ACID Take 250 mg by mouth daily.        Signed: Dorian HeckleJoseph D Deniesha Stenglein, MD 05/05/2018, 3:33 PM

## 2018-05-05 NOTE — Evaluation (Signed)
Physical Therapy Evaluation Patient Details Name: Kelsey [name] MRN: [id number] DOB: May 26, 1965 Today's Date: 05/05/2018   History of Present Illness  Pt is a 52 y/o female s/p ALIF L4-L5. PMH including but not limited to HLD.  Clinical Impression  Pt presented supine in bed with HOB elevated, awake and willing to participate in therapy session. Prior to admission, pt reported that she was independent with all functional mobility and ADLs. Pt lives with her husband in a two level house with a few steps to enter. Pt's husband will be able to provide 24/7 supervision/assistance upon d/c. Pt currently at min guard overall for functional mobility with use of RW to ambulate. PT reviewed 3/3 back precautions with pt throughout as well as a generalized walking program for pt to initiate upon d/c home. PT will continue to follow acutely to progress mobility as tolerated. Plan for stair training at next session.    Follow Up Recommendations No PT follow up    Equipment Recommendations  Rolling walker with 5" wheels    Recommendations for Other Services       Precautions / Restrictions Precautions Precautions: Back Precaution Comments: PT reviewed 3/3 back precautions and log roll technique with pt throughout Required Braces or Orthoses: Spinal Brace Spinal Brace: Lumbar corset;Applied in sitting position Restrictions Weight Bearing Restrictions: No      Mobility  Bed Mobility Overal bed mobility: Needs Assistance Bed Mobility: Rolling;Sidelying to Sit Rolling: Min guard Sidelying to sit: Min guard       General bed mobility comments: increased time and effort, cueing for log roll technique, use of bed rails, min guard for safety  Transfers Overall transfer level: Needs assistance Equipment used: None;Rolling walker (2 wheeled) Transfers: Sit to/from Stand Sit to Stand: Min guard         General transfer comment: increased time and effort, min guard for  safety  Ambulation/Gait Ambulation/Gait assistance: Min guard Gait Distance (Feet): 150 Feet Assistive device: Rolling walker (2 wheeled) Gait Pattern/deviations: Step-through pattern;Decreased stride length Gait velocity: decreased   General Gait Details: pt with slow, cautious and guarded gait, steady with RW, no LOB or need for physical assistance, min guard for safety  Stairs            Wheelchair Mobility    Modified Rankin (Stroke Patients Only)       Balance Overall balance assessment: Needs assistance Sitting-balance support: Feet supported Sitting balance-Leahy Scale: Good     Standing balance support: During functional activity;No upper extremity supported Standing balance-Leahy Scale: Fair                               Pertinent Vitals/Pain Pain Assessment: 0-10 Pain Score: 5  Pain Location: L abdomen (incision site) Pain Descriptors / Indicators: Sore;Grimacing;Guarding Pain Intervention(s): Monitored during session;Repositioned    Home Living Family/patient expects to be discharged to:: Private residence Living Arrangements: Spouse/significant other Available Help at Discharge: Family;Available 24 hours/day Type of Home: House Home Access: Stairs to enter   [employer] Corporation[employer]trance Stairs-Number of Steps: 2 Home Layout: Two level Home Equipment: Cane - single point;Crutches      Prior Function Level of Independence: Independent               Hand Dominance        Extremity/Trunk Assessment   Upper Extremity Assessment Upper Extremity Assessment: Overall WFL for tasks assessed    Lower Extremity Assessment Lower Extremity Assessment: Overall  WFL for tasks assessed    Cervical / Trunk Assessment Cervical / Trunk Assessment: Other exceptions Cervical / Trunk Exceptions: s/p lumbar sx  Communication   Communication: No difficulties  Cognition Arousal/Alertness: Awake/alert Behavior During Therapy: WFL for tasks  assessed/performed Overall Cognitive Status: Within Functional Limits for tasks assessed                                        General Comments      Exercises     Assessment/Plan    PT Assessment Patient needs continued PT services  PT Problem List Decreased balance;Decreased mobility;Decreased coordination;Decreased knowledge of use of DME;Decreased safety awareness;Decreased knowledge of precautions;Pain       PT Treatment Interventions Gait training;DME instruction;Stair training;Functional mobility training;Therapeutic activities;Therapeutic exercise;Balance training;Neuromuscular re-education;Patient/family education    PT Goals (Current goals can be found in the Care Plan section)  Acute Rehab PT Goals Patient Stated Goal: decrease pain PT Goal Formulation: With patient Time For Goal Achievement: 05/19/18 Potential to Achieve Goals: Good    Frequency Min 5X/week   Barriers to discharge        Co-evaluation               AM-PAC PT "6 Clicks" Mobility  Outcome Measure Help needed turning from your back to your side while in a flat bed without using bedrails?: None Help needed moving from lying on your back to sitting on the side of a flat bed without using bedrails?: None Help needed moving to and from a bed to a chair (including a wheelchair)?: None Help needed standing up from a chair using your arms (e.g., wheelchair or bedside chair)?: None Help needed to walk in hospital room?: A Little Help needed climbing 3-5 steps with a railing? : A Little 6 Click Score: 22    End of Session Equipment Utilized During Treatment: Gait belt;Back brace Activity Tolerance: Patient tolerated treatment well Patient left: in chair;with call bell/phone within reach;with family/visitor present Nurse Communication: Mobility status PT Visit Diagnosis: Other abnormalities of gait and mobility (R26.89);Pain Pain - Right/Left: Left Pain - part of body: (abdomen  (incision site))    Time: [id number] PT Time Calculation (min) (ACUTE ONLY): 23 min   Charges:   PT Evaluation $PT Eval Moderate Complexity: 1 Mod PT Treatments $Gait Training: 8-22 mins        Deborah ChalkJennifer Jericha Bryden, PT, DPT  Acute Rehabilitation Services Pager [phone] Office [phone]    Alessandra BevelsJennifer M Ashiya Kinkead 05/05/2018, 10:02 AM

## 2018-05-05 NOTE — Evaluation (Signed)
Occupational Therapy Evaluation Patient Details Name: Kelsey MaroonBrenda C Castrogiovanni MRN: [id number] DOB: 1966/05/08 Today's Date: 05/05/2018    History of Present Illness Pt is a 52 y/o female s/p ALIF L4-L5. PMH including but not limited to HLD.   Clinical Impression   Patient is s/p ALIF L4-5 surgery resulting in the deficits listed below (see OT Problem List). The patient reported prior to sx they were completing ADLS/IADLS independently.  Patient is planning to return home with spouse there 24/7. The patient was able to compete LE dressing with supervision to modified independence using reacher and sock aide and UE with modified independence. The patient was able to verbalize 3/3 precautions and demonstrated on how to don/doff brace. The family reported they are getting AE to use when home.     Follow Up Recommendations  No OT follow up;Supervision - Intermittent    Equipment Recommendations  Shower chair, reacher, sock aid, long handle sponge   Recommendations for Other Services       Precautions / Restrictions Precautions Precautions: Back Precaution Comments: Patietn was able report 3/3 precautions and when to have brace donned Required Braces or Orthoses: Spinal Brace Spinal Brace: Lumbar corset;Applied in sitting position Restrictions Weight Bearing Restrictions: No      Mobility Bed Mobility Overal bed mobility: Needs Assistance Bed Mobility: Rolling;Sidelying to Sit Rolling: Min guard Sidelying to sit: Min guard       General bed mobility comments: HOB elevated, increase time with bed rail  Transfers Overall transfer level: Needs assistance Equipment used: Straight cane;Rolling walker (2 wheeled) Transfers: Sit to/from Stand Sit to Stand: Modified independent (Device/Increase time)         General transfer comment: increased time and effort, min guard for safety    Balance Overall balance assessment: Needs assistance Sitting-balance support: Feet supported Sitting  balance-Leahy Scale: Good     Standing balance support: No upper extremity supported Standing balance-Leahy Scale: Fair                             ADL either performed or assessed with clinical judgement   ADL Overall ADL's : Needs assistance/impaired Eating/Feeding: Independent   Grooming: Wash/dry hands;Standing;Modified independent   Upper Body Bathing: Sitting;Modified independent   Lower Body Bathing: Sit to/from stand;Supervison/ safety   Upper Body Dressing : Modified independent;Sitting   Lower Body Dressing: Supervision/safety;Sit to/from stand   Toilet Transfer: Modified Independent   Toileting- Clothing Manipulation and Hygiene: Modified independent;Sit to/from stand   Tub/ Engineer, structuralhower Transfer: Walk-in shower;Set up;Cueing for safety   Functional mobility during ADLs: Supervision/safety;Rolling walker       Vision Baseline Vision/History: No visual deficits Vision Assessment?: No apparent visual deficits     Perception     Praxis Praxis Praxis tested?: Within functional limits    Pertinent Vitals/Pain Pain Assessment: 0-10 Pain Score: 3  Pain Location: L abdomen (incision site) Pain Descriptors / Indicators: Sore;Grimacing;Guarding Pain Intervention(s): Limited activity within patient's tolerance;Monitored during session     Hand Dominance     Extremity/Trunk Assessment Upper Extremity Assessment Upper Extremity Assessment: Overall WFL for tasks assessed   Lower Extremity Assessment Lower Extremity Assessment: Defer to PT evaluation   Cervical / Trunk Assessment Cervical / Trunk Assessment: Other exceptions Cervical / Trunk Exceptions: s/p lumbar sx   Communication Communication Communication: No difficulties   Cognition Arousal/Alertness: Awake/alert Behavior During Therapy: WFL for tasks assessed/performed Overall Cognitive Status: Within Functional Limits for tasks assessed  General Comments       Exercises     Shoulder Instructions      Home Living Family/patient expects to be discharged to:: Private residence Living Arrangements: Spouse/significant other Available Help at Discharge: Family;Available 24 hours/day Type of Home: House Home Access: Stairs to enter [employer] CorporationEntrance Stairs-Number of Steps: 2   Home Layout: Two level Alternate Level Stairs-Number of Steps: flight   Bathroom Shower/Tub: Producer, television/film/videoWalk-in shower   Bathroom Toilet: Standard Bathroom Accessibility: Yes How Accessible: Accessible via walker Home Equipment: Cane - single point;Crutches          Prior Functioning/Environment Level of Independence: Independent                 OT Problem List: Increased edema;Decreased strength      OT Treatment/Interventions:      OT Goals(Current goals can be found in the care plan section) Acute Rehab OT Goals Patient Stated Goal: decrease pain OT Goal Formulation: With patient/family Time For Goal Achievement: 05/12/18 Potential to Achieve Goals: Good  OT Frequency:     Barriers to D/C:            Co-evaluation              AM-PAC OT "6 Clicks" Daily Activity     Outcome Measure Help from another person eating meals?: None Help from another person taking care of personal grooming?: None Help from another person toileting, which includes using toliet, bedpan, or urinal?: A Little Help from another person bathing (including washing, rinsing, drying)?: A Little Help from another person to put on and taking off regular upper body clothing?: A Little Help from another person to put on and taking off regular lower body clothing?: A Little 6 Click Score: 20   End of Session Equipment Utilized During Treatment: Gait belt;Rolling walker;Back brace  Activity Tolerance: Patient tolerated treatment well Patient left: in bed;with call bell/phone within reach;with family/visitor present  OT Visit Diagnosis: Pain Pain - part of body:  (L abdomen )                Time: [id number] OT Time Calculation (min): 43 min Charges:  OT General Charges $OT Visit: 1 Visit OT Evaluation $OT Eval Low Complexity: 1 Low OT Treatments $Self Care/Home Management : 23-37 mins  Alphia MohKira Elmin Wiederholt OTR/L  Acute Rehab Services  [phone] office number [phone] pager number   Alphia MohKira  Hawken Bielby 05/05/2018, 2:58 PM

## 2018-05-05 NOTE — Care Management (Signed)
Pt states she does not need DME.  Pt has shower chair and feels comfortable to use cane instead of RW.

## 2018-05-07 ENCOUNTER — Encounter (HOSPITAL_COMMUNITY): Payer: Self-pay | Admitting: Neurological Surgery

## 2018-05-08 MED FILL — Heparin Sodium (Porcine) Inj 1000 Unit/ML: INTRAMUSCULAR | Qty: 30 | Status: AC

## 2018-05-08 MED FILL — Sodium Chloride IV Soln 0.9%: INTRAVENOUS | Qty: 1000 | Status: AC

## 2018-05-23 ENCOUNTER — Other Ambulatory Visit (HOSPITAL_COMMUNITY): Payer: Self-pay | Admitting: Neurological Surgery

## 2018-05-23 ENCOUNTER — Ambulatory Visit (HOSPITAL_COMMUNITY)
Admission: RE | Admit: 2018-05-23 | Discharge: 2018-05-23 | Disposition: A | Discharge status: Home / Self Care | Payer: 59 | Source: Ambulatory Visit | Attending: Neurological Surgery | Admitting: Neurological Surgery

## 2018-05-23 DIAGNOSIS — R52 Pain, unspecified: Secondary | ICD-10-CM | POA: Insufficient documentation

## 2018-05-23 NOTE — Progress Notes (Signed)
Left lower extremity venous duplex exam completed. Please see more details on CV PROC under chart review. Result notified ordering physician's office spoke with Dr. Verlee Rossetti medical assistant.  Kenney Going H Tryson Lumley(RDMS RVT) 05/23/18 3:22 PM

## 2019-02-21 ENCOUNTER — Other Ambulatory Visit: Payer: Self-pay

## 2019-02-21 MED ORDER — NORETHINDRONE 0.35 MG PO TABS: 1.0000 | ORAL_TABLET | Freq: Every day | ORAL | 0 refills | Status: DC

## 2019-02-21 NOTE — Telephone Encounter (Signed)
Medication refill request: Kelsey [name] AEX:  03/30/2018 BS Next AEX: 04/17/2019 [name] MMG (if hormonal medication request): 04/24/2018 BIRADS 1 Negative Density A Refill authorized: Pending #[id number] with no refills if appropriate. Please advise.

## 2019-03-11 ENCOUNTER — Other Ambulatory Visit: Payer: Self-pay | Admitting: Obstetrics and Gynecology

## 2019-03-11 DIAGNOSIS — Z1231 Encounter for screening mammogram for malignant neoplasm of breast: Secondary | ICD-10-CM

## 2019-04-15 ENCOUNTER — Other Ambulatory Visit: Payer: Self-pay

## 2019-04-16 NOTE — Progress Notes (Signed)
53 y.o. 620P0000 Married Caucasian female here for annual exam.    Loosing weight doing keto diet.   Patient has been skipping cycles. Taking POPs.  No hot flashes.   LNMP 02-06-19, PMP 01-13-19, PMP 10-02-18  PCP: Gweneth DimitriWendy McNeill, MD    Patient's last menstrual period was 02/06/2019 (exact date).           Sexually active: Yes.    The current method of family planning is OCP (estrogen/progesterone).    Exercising: No.  The patient does not participate in regular exercise at present. Smoker:  no  Health Maintenance: Pap: 02-17-16 Neg:Neg HR HPV, 01-28-13 Neg:Neg HR HPV History of abnormal Pap:  Yes, Hx of CIN I in 2002 and 2003. Hx CIN I and Colpo 2002. Colpo WNL 2003--no treatment MMG: 04-24-18 3D/Neg/density A/BiRads1.  Has appt this month.  Colonoscopy:04/27/15, tubular adenoma, repeat in 5 years   BMD:   n/a  Result  n/a TDaP:  02-10-15 Gardasil:   no HIV:Neg years ago Hep C:Neg years ago Screening Labs:  PCP. Flu vaccine:  Completed.    reports that she has never smoked. She has never used smokeless tobacco. She reports current alcohol use of about 1.0 standard drinks of alcohol per week. She reports that she does not use drugs.  Past Medical History:  Diagnosis Date  . Abnormal Pap smear   . Bulging lumbar disc    L4, l5--scheduled for surgery 05-04-18--Dr. Elsner  . Hyperlipidemia   . Infertility, female    no method of birth from late 20's through the 7940's,  Headaches on OCP  . Migraine    with aura    Past Surgical History:  Procedure Laterality Date  . ABDOMINAL EXPOSURE N/A 05/04/2018   Procedure: ABDOMINAL EXPOSURE;  Surgeon: Cephus Shellinglark, Christopher J, MD;  [facility];  Service: Vascular;  Laterality: N/A;  . ANKLE SURGERY  1998/1999   left ankle fracture with ORIF  . ANTERIOR LUMBAR FUSION N/A 05/04/2018   Procedure: Lumbar Four-Five Anterior  lumbar interbody fusion;  Surgeon: Barnett AbuElsner, Henry, MD;  [facility];  Service: Neurosurgery;  Laterality: N/A;   anterior  . CARPAL TUNNEL RELEASE  05/21/12   right wrist  . COLPOSCOPY     CIN I  . HIP SURGERY Left 03-06-13   [facility] Torn Labrum from injury  . LASIK    . MOLE REMOVAL  2000   off back - benign    Current Outpatient Medications  Medication Sig Dispense Refill  . amitriptyline (ELAVIL) 10 MG tablet Take 10 mg by mouth at bedtime.   3  . BLACK ELDERBERRY PO Take 1 tablet by mouth daily.    . calcium carbonate (CALCIUM 600) 600 MG TABS tablet Take 600 mg by mouth 2 (two) times daily with a meal.    . cholecalciferol (VITAMIN D3) 25 MCG (1000 UT) tablet Take 1,000 Units by mouth daily.    Marland Kitchen. doxycycline (VIBRA-TABS) 100 MG tablet Take 100 mg by mouth 2 (two) times daily as needed (irritation).   3  . HYDROcodone-acetaminophen (NORCO/VICODIN) 5-325 MG tablet Take 1 tablet by mouth every 6 (six) hours as needed. Takes prn for migraines    . Magnesium 500 MG CAPS Take 1 capsule by mouth daily.    . methocarbamol (ROBAXIN) 500 MG tablet Take 1 tablet (500 mg total) by mouth every 6 (six) hours as needed for muscle spasms. 60 tablet 1  . Multiple Vitamins-Minerals (CENTRUM ULTRA WOMENS PO) Take 1 tablet by  mouth daily.    . norethindrone (CAMILA) 0.35 MG tablet Take 1 tablet (0.35 mg total) by mouth daily. 3 Package 0  . Omega-3 Fatty Acids (FISH OIL) 1200 MG CAPS Take 1 capsule by mouth daily.    . ondansetron (ZOFRAN-ODT) 4 MG disintegrating tablet Take 1 tablet by mouth as needed. Takes if has migraine    . oxyCODONE-acetaminophen (PERCOCET/ROXICET) 5-325 MG tablet Take 1-2 tablets by mouth every 6 (six) hours as needed for moderate pain or severe pain. 60 tablet 0  . vitamin B-12 (CYANOCOBALAMIN) 500 MCG tablet Take 500 mcg by mouth daily.    . vitamin C (ASCORBIC ACID) 250 MG tablet Take 250 mg by mouth daily.     No current facility-administered medications for this visit.     Family History  Problem Relation Age of Onset  . Diabetes Mother   . Alzheimer's disease Mother    . Hypertension Father   . Diabetes Father   . Heart disease Father   . Heart failure Father   . Hypertension Brother   . Colon cancer Brother 62       Colectomy with Appendectomy  . Diabetes Brother   . Alcohol abuse Brother   . Alzheimer's disease Maternal Grandmother   . Alzheimer's disease Maternal Aunt     Review of Systems  All other systems reviewed and are negative.   Exam:   BP 130/74 (Cuff Size: Large)   Pulse 70   Temp (!) 97.2 F (36.2 C) (Temporal)   Resp 16   Ht 5' 3.5" (1.613 m)   Wt 198 lb (89.8 kg)   LMP 02/06/2019 (Exact Date)   BMI 34.52 kg/m     General appearance: alert, cooperative and appears stated age Head: normocephalic, without obvious abnormality, atraumatic Neck: no adenopathy, supple, symmetrical, trachea midline and thyroid normal to inspection and palpation Lungs: clear to auscultation bilaterally Breasts: normal appearance, no masses or tenderness, No nipple retraction or dimpling, No nipple discharge or bleeding, No axillary adenopathy Heart: regular rate and rhythm Abdomen: soft, non-tender; no masses, no organomegaly Extremities: extremities normal, atraumatic, no cyanosis or edema Skin: skin color, texture, turgor normal. No rashes.  Right medial thigh close to vulva has 3 cm slightly violaceous coloration to skin (looks like a birth mark). Lymph nodes: cervical, supraclavicular, and axillary nodes normal. Neurologic: grossly normal  Pelvic: External genitalia:  no lesions              No abnormal inguinal nodes palpated.              Urethra:  normal appearing urethra with no masses, tenderness or lesions              Bartholins and Skenes: normal                 Vagina: normal appearing vagina with normal color and discharge, no lesions              Cervix: no lesions              Pap taken: No. Bimanual Exam:  Uterus:  normal size, contour, position, consistency, mobility, non-tender              Adnexa: no mass, fullness,  tenderness              Rectal exam: Yes.  .  Confirms.              Anus:  normal sphincter tone, no lesions  Chaperone was present for exam.  Assessment:   Well woman visit with normal exam. Remote hx CIN I.  Dysmenorrhea controlled with POP.  FH colon cancer - brother.   Plan: Mammogram screening discussed. Self breast awareness reviewed. Pap and HR HPV in 2022.  New guidelines discussed.  Guidelines for Calcium, Vitamin D, regular exercise program including cardiovascular and weight bearing exercise. Continue POPs.  We discussed signs and symptoms of menopause and changes in reproductive function, cardiovascular health, and bone health. Follow up annually and prn.   After visit summary provided.

## 2019-04-17 ENCOUNTER — Ambulatory Visit (INDEPENDENT_AMBULATORY_CARE_PROVIDER_SITE_OTHER): Payer: 59 | Admitting: Obstetrics and Gynecology

## 2019-04-17 ENCOUNTER — Encounter: Payer: Self-pay | Admitting: Obstetrics and Gynecology

## 2019-04-17 ENCOUNTER — Other Ambulatory Visit: Payer: Self-pay

## 2019-04-17 VITALS — BP 130/74 | HR 70 | Temp 97.2°F | Resp 16 | Ht 63.5 in | Wt 198.0 lb

## 2019-04-17 DIAGNOSIS — Z01419 Encounter for gynecological examination (general) (routine) without abnormal findings: Secondary | ICD-10-CM

## 2019-04-17 MED ORDER — NORETHINDRONE 0.35 MG PO TABS: 1.0000 | ORAL_TABLET | Freq: Every day | ORAL | 3 refills | Status: DC

## 2019-04-17 NOTE — Patient Instructions (Signed)

## 2019-04-30 ENCOUNTER — Other Ambulatory Visit: Payer: Self-pay

## 2019-04-30 ENCOUNTER — Ambulatory Visit
Admission: RE | Admit: 2019-04-30 | Discharge: 2019-04-30 | Disposition: A | Discharge status: Home / Self Care | Payer: 59 | Source: Ambulatory Visit | Attending: Obstetrics and Gynecology | Admitting: Obstetrics and Gynecology

## 2019-04-30 DIAGNOSIS — Z1231 Encounter for screening mammogram for malignant neoplasm of breast: Secondary | ICD-10-CM

## 2019-05-17 DIAGNOSIS — G4730 Sleep apnea, unspecified: Secondary | ICD-10-CM

## 2019-05-17 HISTORY — DX: Sleep apnea, unspecified: G47.30

## 2019-08-09 DIAGNOSIS — M7710 Lateral epicondylitis, unspecified elbow: Secondary | ICD-10-CM | POA: Insufficient documentation

## 2019-11-15 IMAGING — CR DG OR LOCAL ABDOMEN
1 series · 1 of 1 positions shown · non-contrast
Comparison: None.

CLINICAL DATA: Follow-up anterior L4-5 fusion, assess for retained
instruments

EXAM:
OR LOCAL ABDOMEN

[AP]
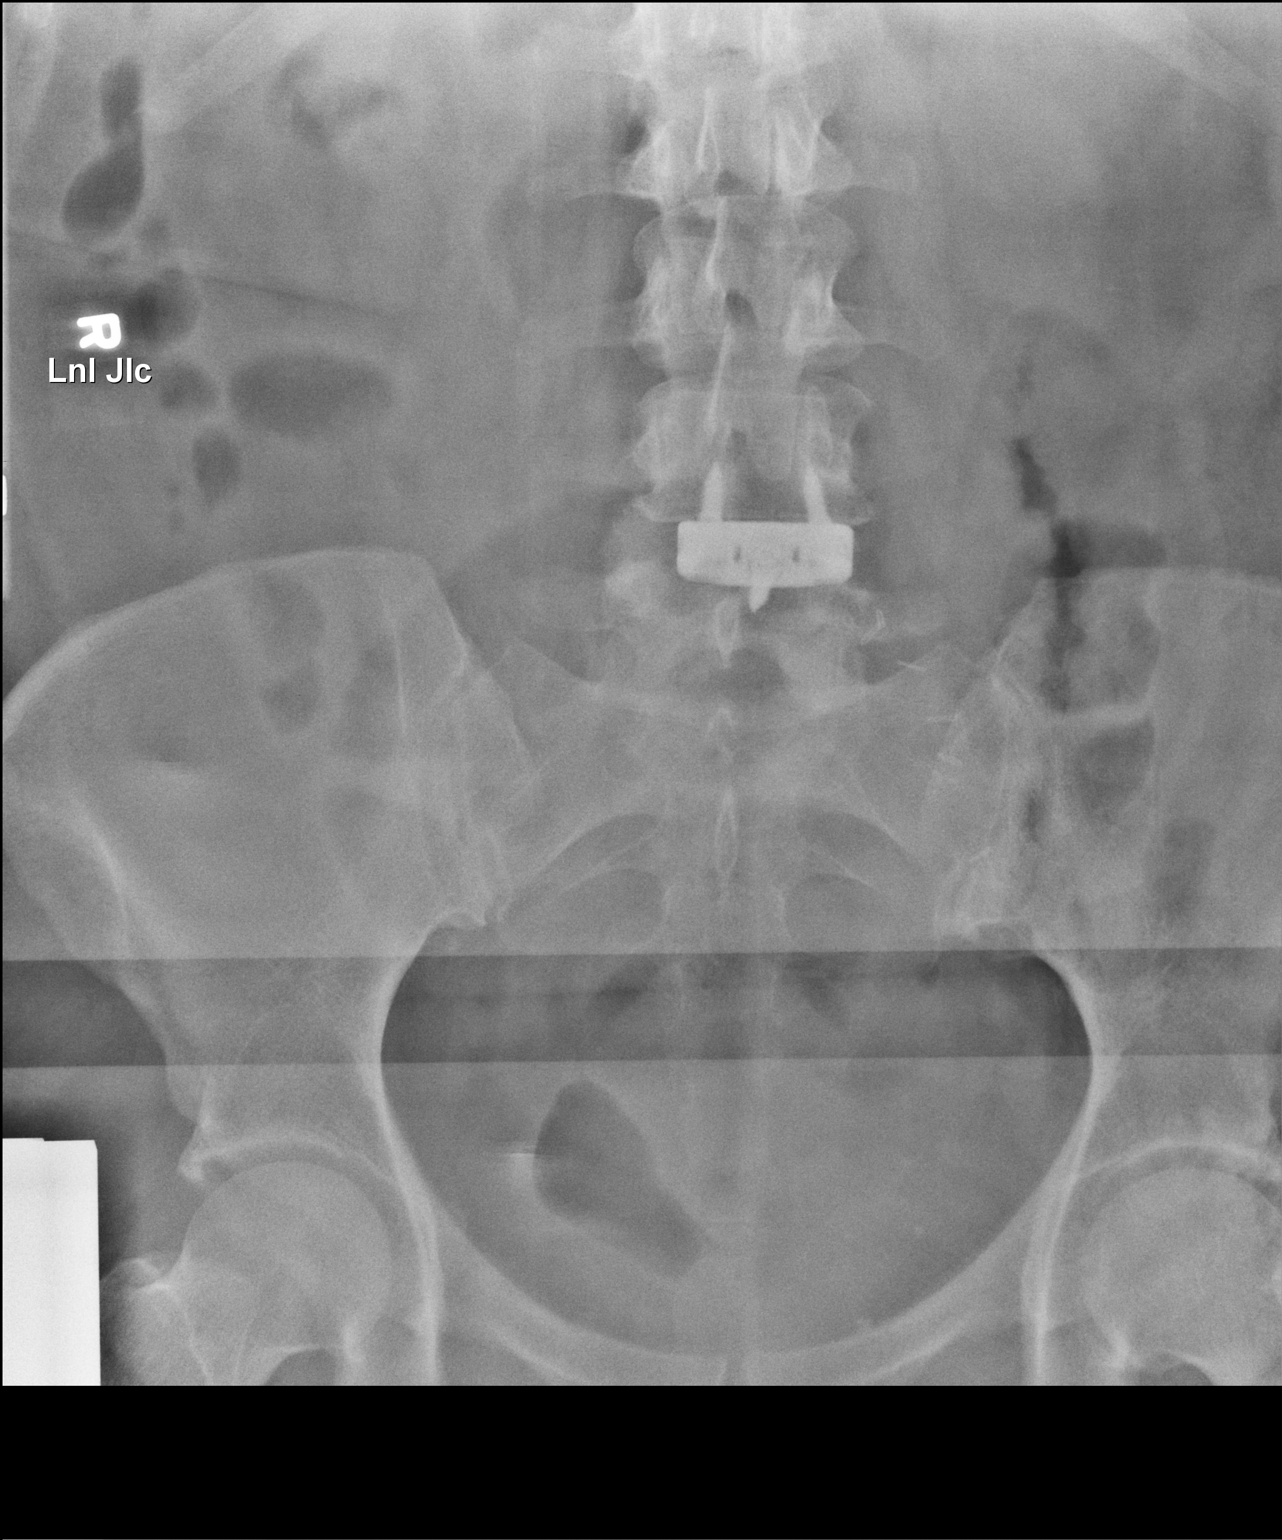

[1 of 1 positions shown; findings below may reference images not displayed]

FINDINGS: Scattered large and small bowel gas is noted. Postsurgical changes
are noted at L4-5. Multiple surgical clips are seen. No radiopaque
foreign body is identified.
IMPRESSION: Postsurgical changes are noted.  No radiopaque foreign body is seen.

Critical Value/emergent results were called by telephone at the time
of interpretation on 05/04/2018 at [DATE] to Yordanis Gordon the
circulating nurse in OR 18, who verbally acknowledged these results
and will communicate to Dr. Nivirus..

## 2019-11-15 IMAGING — RF DG C-ARM 61-120 MIN
1 series · 2 of 2 positions shown · non-contrast
Comparison: None.

CLINICAL DATA: L4-5 anterior fusion.

EXAM:
LUMBAR SPINE - 2-3 VIEW; DG C-ARM 61-120 MIN

[Series 1: run · 2 of 2 slices shown]
[im 1/2]
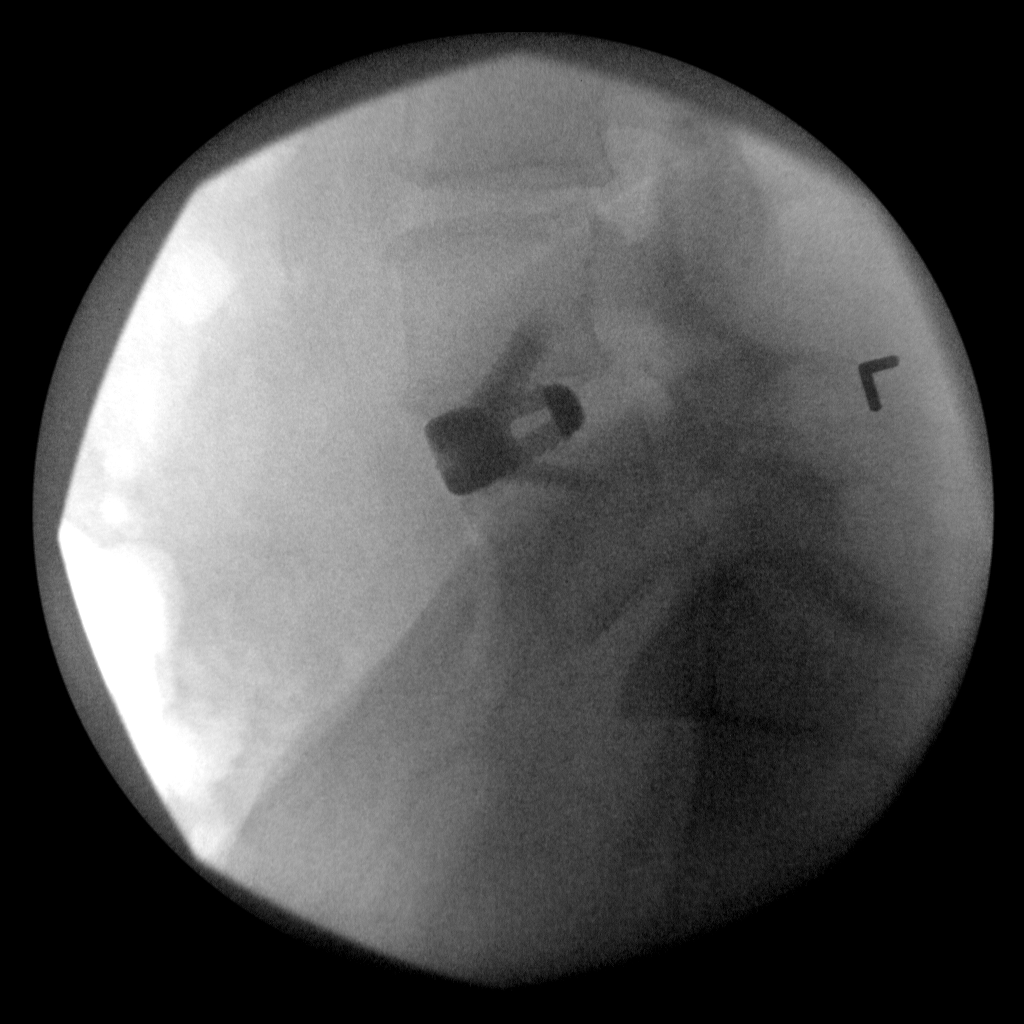
[im 2/2]
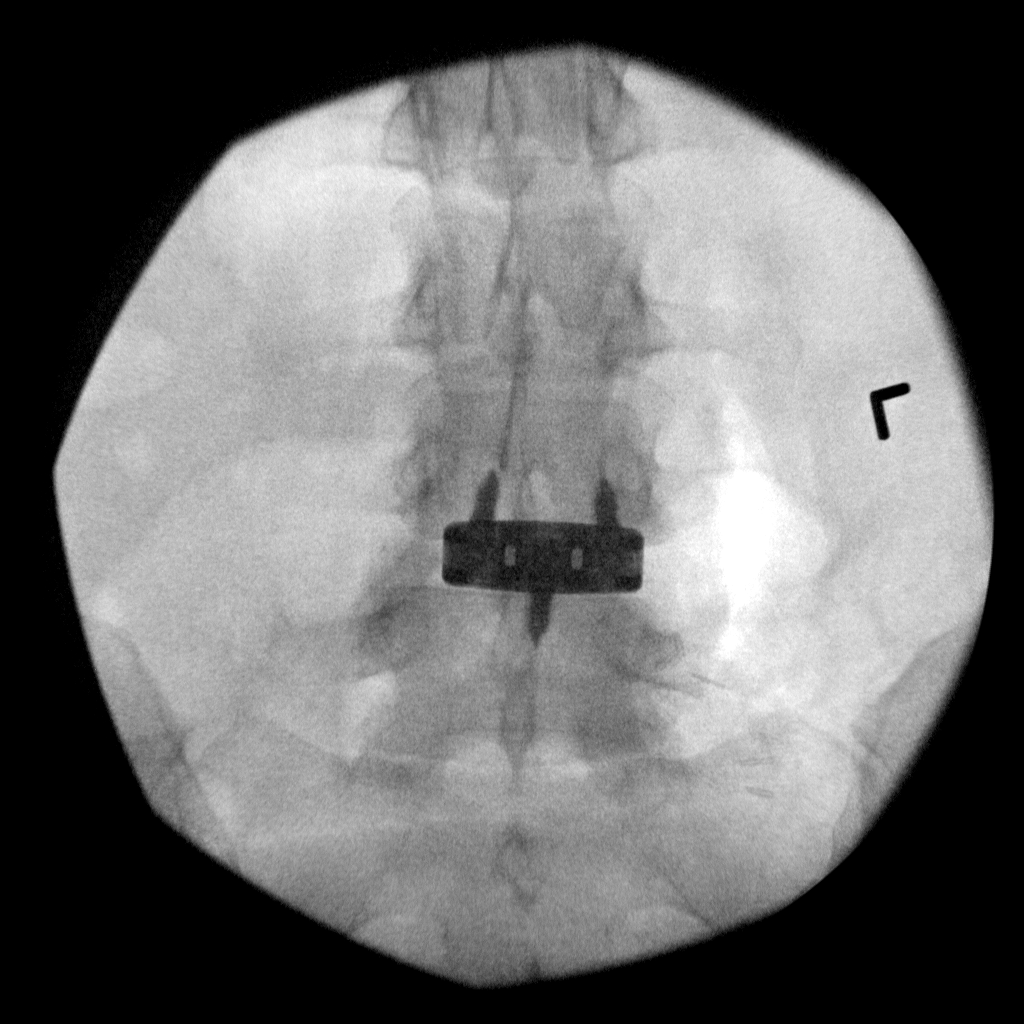

[2 of 2 positions shown; findings below may reference images not displayed]

FINDINGS: Two views show anterior lumbar discectomy and fusion procedure at
L4-5. Components appear well positioned. No radiographically
detectable complication.
IMPRESSION: Anterior lumbar discectomy and fusion at L4-5.

## 2020-02-03 ENCOUNTER — Telehealth: Payer: Self-pay | Admitting: Obstetrics and Gynecology

## 2020-02-03 NOTE — Telephone Encounter (Signed)
Left message on voicemail to call and reschedule cancelled appointment. °

## 2020-02-04 ENCOUNTER — Telehealth: Payer: Self-pay | Admitting: Neurology

## 2020-02-04 ENCOUNTER — Encounter: Payer: Self-pay | Admitting: Neurology

## 2020-02-04 ENCOUNTER — Other Ambulatory Visit: Payer: Self-pay

## 2020-02-04 ENCOUNTER — Ambulatory Visit (INDEPENDENT_AMBULATORY_CARE_PROVIDER_SITE_OTHER): Payer: No Typology Code available for payment source | Admitting: Neurology

## 2020-02-04 VITALS — BP 124/86 | HR 64 | Ht 64.0 in | Wt 205.0 lb

## 2020-02-04 DIAGNOSIS — R351 Nocturia: Secondary | ICD-10-CM

## 2020-02-04 DIAGNOSIS — G478 Other sleep disorders: Secondary | ICD-10-CM

## 2020-02-04 DIAGNOSIS — Z820 Family history of epilepsy and other diseases of the nervous system: Secondary | ICD-10-CM | POA: Diagnosis not present

## 2020-02-04 DIAGNOSIS — R251 Tremor, unspecified: Secondary | ICD-10-CM

## 2020-02-04 DIAGNOSIS — R0683 Snoring: Secondary | ICD-10-CM | POA: Diagnosis not present

## 2020-02-04 DIAGNOSIS — G479 Sleep disorder, unspecified: Secondary | ICD-10-CM

## 2020-02-04 NOTE — Patient Instructions (Signed)
I do not have a good explanation for your sense of vibration affecting your levels and happening primarily at night when you are asleep.  On the positive side, your neurological exam is normal.  I do not see any signs of Parkinson's-like changes either.  Thankfully, your migraines are typically under good control.  Nevertheless, since you do not sleep very well and have a history of snoring I would like to proceed with a sleep study to rule out obstructive sleep apnea as a potential cause to wake you up in the middle of the night.  We will also proceed with blood work to look for inflammatory and autoimmune markers, vitamin B12 deficiency.  Dr. Corliss Blacker checked your thyroid screening with a TSH recently.  We will request insurance authorization for a brain MRI with and without contrast since you do have a history of migraines and to rule out a structural cause of your symptoms.  We will call you with all your test results and follow-up in this clinic if needed.  If your sleep study shows obstructive sleep apnea, I may recommend treatment with a CPAP or AutoPap machine.  We will keep you posted as to your sleep test result by phone call as well.

## 2020-02-04 NOTE — Progress Notes (Signed)
Subjective:    Patient ID: Kelsey [name] is a 54 y.o. female.  HPI    Huston Foley, MD, PhD [facility] [address] [address] [place], [place] [address] Dear Dr. Corliss Blacker,  I saw your patient, Kelsey [name], upon your kind request in my neurologic clinic today for initial consultation of her tremors.  Patient is unaccompanied today.  As you know, [name] is a 54 year old right-handed woman with an underlying medical history of degenerative lumbar spine disease with status post surgery in 2019, hyperlipidemia, migraine headaches, and obesity, who reports an approximately 84-month history of intermittent sensation of her limbs vibrating, happens mostly at night, no obvious tremors, no abnormal sensation such as tingling or electric shocklike sensation, feels like a vibration inside and has woken her up from sleep, often happens in the early morning hours.  It can happen once a week, has become a little bit worse or more noticeable or more frequent.  She has not had any obvious tremors, no changes in her balance, no significant headaches, migraines are generally under good control.  She takes amitriptyline at night, she has not had any new medications or new over-the-counter supplements.  She does endorse stress and not sleeping very well, she endorses anxiety.  Nevertheless, nothing to tie in with her symptoms in terms of correlation.  She has not had any visual symptoms, no blurry vision, no double vision, no loss of vision.  She does report a family history of Parkinson's disease affecting her father and paternal grandfather.  She also has a family history of sleep apnea.  She had a sleep study several years ago, maybe as long as 20 years ago for concern for sleep apnea but it was negative at the time.  She does report ongoing issues with nonrestorative sleep and snoring.  I reviewed your office note from December 02, 2019.  She had a TSH and BMP checked at the time.  Results  are not available for my review today.  She also reports joint pain and joint stiffness in various joints including elbows and right shoulder joint, no significant knee pain though.  She has not had any major obvious swelling in her joints.  She does have a family history of arthritis.  Her Past Medical History Is Significant For: Past Medical History:  Diagnosis Date  . Abnormal Pap smear   . Bulging lumbar disc    L4, l5--scheduled for surgery 05-04-18--Dr. Elsner  . Hyperlipidemia   . Infertility, female    no method of birth from late 20's through the 9's,  Headaches on OCP  . Migraine    with aura    Her Past Surgical History Is Significant For: Past Surgical History:  Procedure Laterality Date  . ABDOMINAL EXPOSURE N/A 05/04/2018   Procedure: ABDOMINAL EXPOSURE;  Surgeon: Cephus Shelling, MD;  [facility] OR;  Service: Vascular;  Laterality: N/A;  . ANKLE SURGERY  1998/1999   left ankle fracture with ORIF  . ANTERIOR LUMBAR FUSION N/A 05/04/2018   Procedure: Lumbar Four-Five Anterior  lumbar interbody fusion;  Surgeon: Barnett Abu, MD;  Location: [facility] OR;  Service: Neurosurgery;  Laterality: N/A;  anterior  . CARPAL TUNNEL RELEASE  05/21/12   right wrist  . COLPOSCOPY     CIN I  . HIP SURGERY Left 03-06-13   --[facility] - Torn Labrum from injury  . LASIK    . MOLE REMOVAL  2000   off back - benign  Her Family History Is Significant For: Family History  Problem Relation Age of Onset  . Diabetes Mother   . Alzheimer's disease Mother   . Hypertension Father   . Diabetes Father   . Heart disease Father   . Heart failure Father   . Hypertension Brother   . Colon cancer Brother [id number]       Colectomy with Appendectomy  . Diabetes Brother   . Alcohol abuse Brother   . Alzheimer's disease Maternal Grandmother   . Alzheimer's disease Maternal Aunt     Her Social History Is Significant For: Social History   Socioeconomic History  . Marital status: Married     Spouse name: Not on file  . Number of children: Not on file  . Years of education: Not on file  . Highest education level: Not on file  Occupational History  . Not on file  Tobacco Use  . Smoking status: Never Smoker  . Smokeless tobacco: Never Used  Vaping Use  . Vaping Use: Never used  Substance and Sexual Activity  . Alcohol use: Yes    Alcohol/week: 1.0 standard drink    Types: 1 Glasses of wine per week    Comment: 1 month  . Drug use: No  . Sexual activity: Yes    Partners: Male    Birth control/protection: Pill    Comment: Norethindrone  Other Topics Concern  . Not on file  Social History Narrative  . Not on file   Social Determinants of Health   Financial Resource Strain:   . Difficulty of Paying Living Expenses: Not on file  Food Insecurity:   . Worried About Programme researcher, broadcasting/film/videounning Out of Food in the Last Year: Not on file  . Ran Out of Food in the Last Year: Not on file  Transportation Needs:   . Lack of Transportation (Medical): Not on file  . Lack of Transportation (Non-Medical): Not on file  Physical Activity:   . Days of Exercise per Week: Not on file  . Minutes of Exercise per Session: Not on file  Stress:   . Feeling of Stress : Not on file  Social Connections:   . Frequency of Communication with Friends and Family: Not on file  . Frequency of Social Gatherings with Friends and Family: Not on file  . Attends Religious Services: Not on file  . Active Member of Clubs or Organizations: Not on file  . Attends BankerClub or Organization Meetings: Not on file  . Marital Status: Not on file    Her Allergies Are:  Allergies  Allergen Reactions  . Lodine [Etodolac] Hives       :   Her Current Medications Are:  Outpatient Encounter Medications as of 02/04/2020  Medication Sig  . amitriptyline (ELAVIL) 10 MG tablet Take 10 mg by mouth at bedtime.   Marland Kitchen. BLACK ELDERBERRY PO Take 1 tablet by mouth daily.  . calcium carbonate (CALCIUM 600) 600 MG TABS tablet Take 600 mg by  mouth 2 (two) times daily with a meal.  . cholecalciferol (VITAMIN D3) 25 MCG (1000 UT) tablet Take 1,000 Units by mouth daily.  Marland Kitchen. doxycycline (VIBRA-TABS) 100 MG tablet Take 100 mg by mouth 2 (two) times daily as needed (irritation).   Marland Kitchen. esomeprazole (NEXIUM) 20 MG capsule Take 20 mg by mouth daily at 12 noon.  Marland Kitchen. GELATIN PO Take by mouth.  . Glucosamine HCl (GLUCOSAMINE PO) Take by mouth. 2 tabs daily  . Magnesium 500 MG CAPS Take 1 capsule by  mouth daily.  Marland Kitchen MELATONIN PO Take by mouth.  . methocarbamol (ROBAXIN) 500 MG tablet Take 1 tablet (500 mg total) by mouth every 6 (six) hours as needed for muscle spasms.  . Multiple Vitamins-Minerals (CENTRUM ULTRA WOMENS PO) Take 1 tablet by mouth daily.  . norethindrone (CAMILA) 0.35 MG tablet Take 1 tablet (0.35 mg total) by mouth daily.  . ondansetron (ZOFRAN-ODT) 4 MG disintegrating tablet Take 1 tablet by mouth as needed. Takes if has migraine  . Probiotic Product (PROBIOTIC-10 PO) Take by mouth.  Marland Kitchen PROMETHAZINE HCL PO Take by mouth.  . vitamin B-12 (CYANOCOBALAMIN) 500 MCG tablet Take 500 mcg by mouth daily.  . vitamin C (ASCORBIC ACID) 250 MG tablet Take 250 mg by mouth daily.  . [DISCONTINUED] HYDROcodone-acetaminophen (NORCO/VICODIN) 5-325 MG tablet Take 1 tablet by mouth every 6 (six) hours as needed. Takes prn for migraines (Patient not taking: Reported on 02/04/2020)  . [DISCONTINUED] Omega-3 Fatty Acids (FISH OIL) 1200 MG CAPS Take 1 capsule by mouth daily. (Patient not taking: Reported on 02/04/2020)  . [DISCONTINUED] oxyCODONE-acetaminophen (PERCOCET/ROXICET) 5-325 MG tablet Take 1-2 tablets by mouth every 6 (six) hours as needed for moderate pain or severe pain. (Patient not taking: Reported on 02/04/2020)   No facility-administered encounter medications on file as of 02/04/2020.  :  Review of Systems:  Out of a complete 14 point review of systems, all are reviewed and negative with the exception of these symptoms as listed  below: Review of Systems  Neurological:       Pt reports she is here to discuss worsening intermintent vibrations/tremors in her legs/arms. She sts these sx are mostly present between 3 and 6 am and will wake her from her sleep. Pt reports a family hx of Parkinson's Disease.     Objective:  Neurological Exam  Physical Exam Physical Examination:   Vitals:   02/04/20 1433  BP: 124/86  Pulse: 64  SpO2: 97%    General Examination: The patient is a very pleasant 54 y.o. female in no acute distress. She appears well-developed and well-nourished and well groomed.   HEENT: Normocephalic, atraumatic, pupils are equal, round and reactive to light and accommodation. Funduscopic exam is normal with sharp disc margins noted. Extraocular tracking is good without limitation to gaze excursion or nystagmus noted. Normal smooth pursuit is noted. Hearing is grossly intact. Tympanic membranes are clear bilaterally. Face is symmetric with normal facial animation and normal facial sensation. Speech is clear with no dysarthria noted. There is no hypophonia. There is no lip, neck/head, jaw or voice tremor. Neck is supple with full range of passive and active motion. There are no carotid bruits on auscultation. Oropharynx exam reveals: mild mouth dryness, good dental hygiene and mild airway crowding, due to smaller airway entry, Mallampati class II, tonsils on the smaller side, neck circumference of 14-3/8 inches.  Tongue protrudes centrally in palate elevates symmetrically.  Chest: Clear to auscultation without wheezing, rhonchi or crackles noted.  Heart: S1+S2+0, regular and normal without murmurs, rubs or gallops noted.   Abdomen: Soft, non-tender and non-distended with normal bowel sounds appreciated on auscultation.  Extremities: There is no pitting edema in the distal lower extremities bilaterally. Pedal pulses are intact.  Skin: Warm and dry without trophic changes noted.  Musculoskeletal: exam  reveals no obvious joint deformities, tenderness or joint swelling or erythema.   Neurologically:  Mental status: The patient is awake, alert and oriented in all 4 spheres. Her immediate and remote memory, attention, language skills  and fund of knowledge are appropriate. There is no evidence of aphasia, agnosia, apraxia or anomia. Speech is clear with normal prosody and enunciation. Thought process is linear. Mood is normal and affect is normal.  Cranial nerves II - XII are as described above under HEENT exam. In addition: shoulder shrug is normal with equal shoulder height noted. Motor exam: Normal bulk, strength and tone is noted. There is no drift, tremor or rebound.  In particular, she has no resting, postural or action tremor, no intention tremor.  On 02/04/2020: Archimedes spiral drawing she has no trembling with the right or left hand, slight insecurity with her left hand which is her nondominant hand.  Handwriting is legible, not tremulous, not micrographic.    Romberg is negative. Reflexes are 2+ throughout. Babinski: Toes are flexor bilaterally. Fine motor skills and coordination: intact with normal finger taps, normal hand movements, normal rapid alternating patting, normal foot taps and normal foot agility.  Cerebellar testing: No dysmetria or intention tremor on finger to nose testing. Heel to shin is unremarkable bilaterally. There is no truncal or gait ataxia.  Sensory exam: intact to light touch in the upper and lower extremities.  Gait, station and balance: She stands without difficulty. No veering to one side is noted. No leaning to one side is noted. Posture is age-appropriate and stance is narrow based. Gait shows normal stride length and normal pace. No problems turning are noted. Tandem walk is unremarkable.   Assessment and Plan:   In summary, MARKIYAH GAHM is a very pleasant 54 y.o.-year old female with an underlying medical history of degenerative lumbar spine disease with  status post surgery in 2019, hyperlipidemia, migraine headaches, and obesity, who presents for evaluation of a sensation of vibration affecting her limbs intermittently, particularly at night when she is in bed, sometimes she has woken up from sleep with a sensation without any obvious tremors reported.  She has not had any visible tremors during the day or symptoms during the day that she can think of, but believes that she may be too busy or distracted during the day.  She does report stress and anxiety, she may also be anxious over her symptoms.  On the positive side, her exam is normal and she is reassured in that regard.  She has a family history of Parkinson's disease but her exam does not suggest any evidence of parkinsonism or any tremors otherwise.  Given her history of migraines and her unusual symptomatology I would like to proceed with a brain MRI to rule out a structural cause of her symptoms.  She is agreeable to this.  She had a recent TSH checked through your office.  Of note, she also reports a history of joint stiffness, no obvious abnormality noted in her range of motion today.  Nevertheless, I would like to do additional blood work to look for inflammatory and autoimmune markers, also CK level.  We will check her B12 level as well.  Since she has a history of sleep disturbance and her symptoms have occurred at night, I would like to proceed with a sleep study.  She is agreeable to this.  If she has obstructive sleep apnea I talked her about potentially trying CPAP or AutoPap therapy.  She would be willing to consider this.  For now, we will keep her posted as to her test results including blood work, imaging test and sleep study.  We will arrange a follow-up after testing, if need be.  I  answered all her questions today and she was in agreement with the plan.  Thank you very much for allowing me to participate in the care of this nice patient. If I can be of any further assistance to you please  do not hesitate to call me at [phone].  Sincerely,   Huston Foley, MD, PhD

## 2020-02-04 NOTE — Telephone Encounter (Signed)
[employer] order sent to GI. They will obtain the auth and reach out to the patient to schedule.  

## 2020-02-05 ENCOUNTER — Telehealth: Payer: Self-pay

## 2020-02-05 NOTE — Telephone Encounter (Signed)
LVM for pt to call me back to schedule sleep study  

## 2020-02-06 ENCOUNTER — Telehealth: Payer: Self-pay

## 2020-02-06 NOTE — Progress Notes (Signed)
Please advise patient that her labs were benign.  One test is pending which is her vitamin B6 level.  We will update if abnormal.  We will proceed with brain MRI as scheduled for next month and sleep study testing.  It looks like the sleep lab called and left a message for her to call back.  Please remind patient to call back.

## 2020-02-06 NOTE — Telephone Encounter (Signed)
I called the pt and advised of results. She verbalized understanding. Pt sts she was not in a good placed to be transferred to scheduled her sleep study but would call back to do so.

## 2020-02-06 NOTE — Telephone Encounter (Signed)
-----   Message from Huston Foley, MD sent at 02/06/2020  8:27 AM EDT ----- Please advise patient that her labs were benign.  One test is pending which is her vitamin B6 level.  We will update if abnormal.  We will proceed with brain MRI as scheduled for next month and sleep study testing.  It looks like the sleep lab called and left a message for her to call back.  Please remind patient to call back.

## 2020-02-08 LAB — COMPREHENSIVE METABOLIC PANEL
ALT: 14 IU/L (ref 0–32)
AST: 17 IU/L (ref 0–40)
Albumin/Globulin Ratio: 1.8 (ref 1.2–2.2)
Albumin: 4.8 g/dL (ref 3.8–4.9)
Alkaline Phosphatase: 92 IU/L (ref 44–121)
BUN/Creatinine Ratio: 26 — ABNORMAL HIGH (ref 9–23)
BUN: 22 mg/dL (ref 6–24)
Bilirubin Total: 0.3 mg/dL (ref 0.0–1.2)
CO2: 25 mmol/L (ref 20–29)
Calcium: 9.6 mg/dL (ref 8.7–10.2)
Chloride: 102 mmol/L (ref 96–106)
Creatinine, Ser: 0.84 mg/dL (ref 0.57–1.00)
GFR calc Af Amer: 92 mL/min/{1.73_m2} (ref >59)
GFR calc non Af Amer: 80 mL/min/{1.73_m2} (ref >59)
Globulin, Total: 2.6 g/dL (ref 1.5–4.5)
Glucose: 87 mg/dL (ref 65–99)
Potassium: 4.3 mmol/L (ref 3.5–5.2)
Sodium: 141 mmol/L (ref 134–144)
Total Protein: 7.4 g/dL (ref 6.0–8.5)

## 2020-02-08 LAB — CK: Total CK: 65 U/L (ref 32–182)

## 2020-02-08 LAB — B12 AND FOLATE PANEL
Folate: 17.9 ng/mL (ref >3.0)
Vitamin B-12: 1154 pg/mL (ref 232–1245)

## 2020-02-08 LAB — C-REACTIVE PROTEIN: CRP: 3 mg/L (ref 0–10)

## 2020-02-08 LAB — VITAMIN B6: Vitamin B6: 27.7 ug/L (ref 2.0–32.8)

## 2020-02-08 LAB — HGB A1C W/O EAG: Hgb A1c MFr Bld: 5.3 % (ref 4.8–5.6)

## 2020-02-08 LAB — ANA W/REFLEX: Anti Nuclear Antibody (ANA): NEGATIVE

## 2020-02-08 LAB — RHEUMATOID FACTOR: Rheumatoid fact SerPl-aCnc: <10 IU/mL (ref 0.0–13.9)

## 2020-02-08 LAB — SEDIMENTATION RATE: Sed Rate: 2 mm/hr (ref 0–40)

## 2020-02-08 LAB — RPR: RPR Ser Ql: NONREACTIVE

## 2020-02-10 ENCOUNTER — Telehealth: Payer: Self-pay

## 2020-02-10 NOTE — Telephone Encounter (Signed)
Returned callback, no answer. LVM for pt to call me back to schedule sleep study

## 2020-02-26 ENCOUNTER — Ambulatory Visit
Admission: RE | Admit: 2020-02-26 | Discharge: 2020-02-26 | Disposition: A | Discharge status: Home / Self Care | Payer: No Typology Code available for payment source | Source: Ambulatory Visit | Attending: Neurology | Admitting: Neurology

## 2020-02-26 DIAGNOSIS — G479 Sleep disorder, unspecified: Secondary | ICD-10-CM

## 2020-02-26 DIAGNOSIS — R251 Tremor, unspecified: Secondary | ICD-10-CM | POA: Diagnosis not present

## 2020-02-26 DIAGNOSIS — Z820 Family history of epilepsy and other diseases of the nervous system: Secondary | ICD-10-CM

## 2020-02-26 DIAGNOSIS — R0683 Snoring: Secondary | ICD-10-CM

## 2020-02-26 DIAGNOSIS — R351 Nocturia: Secondary | ICD-10-CM

## 2020-02-26 DIAGNOSIS — G478 Other sleep disorders: Secondary | ICD-10-CM

## 2020-02-26 MED ORDER — GADOBENATE DIMEGLUMINE 529 MG/ML IV SOLN
20.0000 mL | Freq: Once | INTRAVENOUS | Status: AC | PRN
  Administered 2020-02-26: 20 mL via INTRAVENOUS

## 2020-02-27 NOTE — Progress Notes (Signed)
Please call patient and advise her that her recent brain MRI with and without contrast showed no acute findings and was normal for age.

## 2020-03-02 ENCOUNTER — Telehealth: Payer: Self-pay

## 2020-03-02 ENCOUNTER — Ambulatory Visit (INDEPENDENT_AMBULATORY_CARE_PROVIDER_SITE_OTHER): Payer: No Typology Code available for payment source | Admitting: Neurology

## 2020-03-02 DIAGNOSIS — G4733 Obstructive sleep apnea (adult) (pediatric): Secondary | ICD-10-CM | POA: Diagnosis not present

## 2020-03-02 DIAGNOSIS — R0683 Snoring: Secondary | ICD-10-CM

## 2020-03-02 DIAGNOSIS — R351 Nocturia: Secondary | ICD-10-CM

## 2020-03-02 DIAGNOSIS — G478 Other sleep disorders: Secondary | ICD-10-CM

## 2020-03-02 DIAGNOSIS — Z820 Family history of epilepsy and other diseases of the nervous system: Secondary | ICD-10-CM

## 2020-03-02 DIAGNOSIS — R251 Tremor, unspecified: Secondary | ICD-10-CM

## 2020-03-02 DIAGNOSIS — G479 Sleep disorder, unspecified: Secondary | ICD-10-CM

## 2020-03-02 NOTE — Telephone Encounter (Signed)
02/27/2020 Huston Foley, MD  Sunday Spillers, RN Please call patient and advise her that her recent brain MRI with and without contrast showed no acute findings and was normal for age.

## 2020-03-02 NOTE — Telephone Encounter (Signed)
Pt advised of results and verbalized understanding. She will complete the sleep study as recommended. Pt was advised we would call once we have the results.

## 2020-03-12 ENCOUNTER — Telehealth: Payer: Self-pay | Admitting: Neurology

## 2020-03-12 NOTE — Addendum Note (Signed)
Addended by: Huston Foley on: 03/12/2020 07:38 AM   Modules accepted: Orders

## 2020-03-12 NOTE — Progress Notes (Signed)
Patient referred by Dr. Corliss Blacker, seen by me on 02/04/20 for tremor in the legs/vibrating sensation, also reported sleep issues, had a HST on 03/02/20.    Please call and notify the patient that the recent home sleep test showed obstructive sleep apnea in the moderate range. While I recommend treatment for this in the form CPAP, her insurance will not approve a sleep study for this. They will likely only approve a trial of autoPAP, which means, that we don't have to bring her in for a sleep study with CPAP, but will let her start using an autoPAP machine at home, through a DME company (of her choice, or as per insurance requirement). The DME representative will educate her on how to use the machine, how to put the mask on, etc. I have placed an order in the chart. Please send referral, talk to patient, send report to referring MD. We will need a FU in sleep clinic for 10 weeks post-PAP set up, please arrange that with me or one of our NPs. Thanks,   Huston Foley, MD, PhD [facility])

## 2020-03-12 NOTE — Procedures (Signed)
Sleep Study Report   Patient Information     First Name: Kelsey Last Name: [name] ID: [id number]  Birth Date: 2065/10/22 Age: 54 Gender: Female  Referring Provider: Gweneth Dimitri, MD BMI: 35.0 (W=205 lb, H=5' 4'')  Neck Circ.:  14 ''  Sleep Study Information    Study Date: 03/02/20 S/H/A Version: [id number] / [id number] / 62  History:    54 year old woman with a history of degenerative lumbar spine disease with status-post surgery in 2019, hyperlipidemia, migraine headaches, and obesity, who reports an approximately 59-month history of intermittent sensation of her limbs vibrating. She is not sleeping very well.  Summary & Diagnosis:    OSA (obstructive sleep apnea)   Recommendations:     This home sleep test demonstrates moderate obstructive sleep apnea with a total AHI of 18/hour and O2 nadir of 71%. Treatment with positive airway pressure is recommended. This will require, ideally, a full night CPAP titration study for proper treatment settings, O2 monitoring and mask fitting. The patient will be advised to proceed with an autoPAP titration/trial at home for now.  An attended PAP titration study can be considered down the road if needed to optimize treatment settings, mask fitting and oxygen monitoring. Please note that untreated obstructive sleep apnea may carry additional perioperative morbidity. Patients with significant obstructive sleep apnea should receive perioperative PAP therapy and the surgeons and particularly the anesthesiologist should be informed of the diagnosis and the severity of the sleep disordered breathing. The patient should be cautioned not to drive, work at heights, or operate dangerous or heavy equipment when tired or sleepy. Review and reiteration of good sleep hygiene measures should be pursued with any patient. Other causes of the patient's symptoms, including circadian rhythm disturbances, an underlying mood disorder, medication effect and/or an underlying medical  problem cannot be ruled out based on this test. Clinical correlation is recommended. The patient and her referring provider will be notified of the test results. The patient will be seen in follow up in sleep clinic at [facility]  I certify that I have reviewed the raw data recording prior to the issuance of this report in accordance with the standards of the American Academy of Sleep Medicine (AASM).  Huston Foley, MD, PhD [facility]) Diplomat, ABPN (Neurology and Sleep)      Sleep Summary  Oxygen Saturation Statistics   Start Study Time: End Study Time: Total Recording Time:  9:42:37 PM 6:32:02 AM   8 h, 49 min  Total Sleep Time % REM of Sleep Time:  7 h, 43 min  30.5    Mean: 94 Minimum: 71 Maximum: 100  Mean of Desaturations Nadirs (%):   91  Oxygen Desaturation. %:  4-9 10-20 >20 Total  Events Number Total   48  2 94.1 3.9  1 2.0  51 100.0  Oxygen Saturation: <90 <=88 <85 <80 <70  Duration (minutes): Sleep % 0.4 0.1 0.3 0.1 0.1 0.0 0.1 0.0 0.0 0.0     Respiratory Indices      Total Events REM NREM All Night  pRDI: pAHI 3%:  185  131 24.6 18.6 25.8 17.8 25.5 18.0  ODI 4%: pAHI 4%:  51 63 9.3 6.1 7.0 8.7       Pulse Rate Statistics during Sleep (BPM)      Mean: 66 Minimum: 43 Maximum: 105    Indices are calculated using technically valid sleep time of 7 h, 16 min.  pAHI=18.0                                         Mild              Moderate                    Severe                                                 5              15                    30  PAT  Respiratory Events       60  70  80  90  100          Oxygen Saturation: / Pulse Rate (BPM) / PAT Amplitude  SaO2 (%)          40  50  60  70  80  90  100  110     Pulse Rate (BPM)            0  200  400  600  800  1000  1200  1400  1600  1800      PAT  Amp                        21:4  2   22:1  2   22:4  2   23:1  2   23:4  2  0  0:12  0  0:42  0  1:12  0  1:42  0  2:12  0  2:42  0  3:12  0  3:42  0  4:12  0  4:42  0  5:12  0  5:42  0  6:12      Wake / Sleep stages   L Sleep   D Sleep   Wake   REM                           Wake  Sleep      Wake  12.47  %    Sleep  87.53  %   Total:  100.00  %                                                       REM  Light  Deep      REM  30.53  %    Light  57.60  %    Deep  11.87  %   Total:  100.00  %                                 Sleep/Wake States  Sleep Stages  Sleep Latency (min):  REM Latency (min):  Number of Wakes:   18   82   7

## 2020-03-12 NOTE — Telephone Encounter (Signed)
Called patient to discuss sleep study results. No answer at this time. LVM for the patient to call back.   

## 2020-03-12 NOTE — Telephone Encounter (Signed)
-----   Message from Huston Foley, MD sent at 03/12/2020  7:38 AM EDT ----- Patient referred by Dr. Corliss Blacker, seen by me on 02/04/20 for tremor in the legs/vibrating sensation, also reported sleep issues, had a HST on 03/02/20.    Please call and notify the patient that the recent home sleep test showed obstructive sleep apnea in the moderate range. While I recommend treatment for this in the form CPAP, her insurance will not approve a sleep study for this. They will likely only approve a trial of autoPAP, which means, that we don't have to bring her in for a sleep study with CPAP, but will let her start using an autoPAP machine at home, through a DME company (of her choice, or as per insurance requirement). The DME representative will educate her on how to use the machine, how to put the mask on, etc. I have placed an order in the chart. Please send referral, talk to patient, send report to referring MD. We will need a FU in sleep clinic for 10 weeks post-PAP set up, please arrange that with me or one of our NPs. Thanks,   Huston Foley, MD, PhD [facility])

## 2020-03-13 NOTE — Telephone Encounter (Signed)
Pt is asking for a call back with the results to the sleep study

## 2020-03-16 ENCOUNTER — Other Ambulatory Visit: Payer: Self-pay | Admitting: Obstetrics and Gynecology

## 2020-03-16 DIAGNOSIS — Z1231 Encounter for screening mammogram for malignant neoplasm of breast: Secondary | ICD-10-CM

## 2020-03-16 NOTE — Telephone Encounter (Signed)
I called pt. I advised pt that Dr. Frances Furbish reviewed their sleep study results and found that pt has sleep apnea. Dr. Frances Furbish recommends that pt starts auto CPAP. I reviewed PAP compliance expectations with the pt. Pt is agreeable to starting a CPAP. I advised pt that an order will be sent to a DME, [facility] and [facility] will call the pt within about one week after they file with the pt's insurance. [facility]) will show the pt how to use the machine, fit for masks, and troubleshoot the CPAP if needed. A follow up appt will be made for the patient in 31-90 days after getting her machine. She will have to call us to schedule this. Pt verbalized understanding of results. Pt had no questions at this time but was encouraged to call back if questions arise. I have sent the order to [facility]) and have received confirmation that they have received the order.

## 2020-03-29 ENCOUNTER — Other Ambulatory Visit: Payer: Self-pay | Admitting: Obstetrics and Gynecology

## 2020-03-29 DIAGNOSIS — Z01419 Encounter for gynecological examination (general) (routine) without abnormal findings: Secondary | ICD-10-CM

## 2020-03-30 NOTE — Telephone Encounter (Signed)
Medication refill request: Norethindrone 0.35mg   Last AEX:  04/17/19 Next AEX: 08/18/20 Last MMG (if hormonal medication request): 04/30/19  Neg  Refill authorized: 84/0

## 2020-04-20 ENCOUNTER — Ambulatory Visit: Payer: 59 | Admitting: Obstetrics and Gynecology

## 2020-04-30 ENCOUNTER — Ambulatory Visit
Admission: RE | Admit: 2020-04-30 | Discharge: 2020-04-30 | Disposition: A | Discharge status: Home / Self Care | Payer: No Typology Code available for payment source | Source: Ambulatory Visit | Attending: Obstetrics and Gynecology | Admitting: Obstetrics and Gynecology

## 2020-04-30 ENCOUNTER — Other Ambulatory Visit: Payer: Self-pay

## 2020-04-30 DIAGNOSIS — Z1231 Encounter for screening mammogram for malignant neoplasm of breast: Secondary | ICD-10-CM

## 2020-06-24 ENCOUNTER — Ambulatory Visit (INDEPENDENT_AMBULATORY_CARE_PROVIDER_SITE_OTHER): Payer: No Typology Code available for payment source | Admitting: Neurology

## 2020-06-24 ENCOUNTER — Encounter: Payer: Self-pay | Admitting: Neurology

## 2020-06-24 VITALS — BP 149/84 | HR 70 | Ht 64.0 in | Wt 211.5 lb

## 2020-06-24 DIAGNOSIS — G4733 Obstructive sleep apnea (adult) (pediatric): Secondary | ICD-10-CM | POA: Diagnosis not present

## 2020-06-24 DIAGNOSIS — Z9989 Dependence on other enabling machines and devices: Secondary | ICD-10-CM

## 2020-06-24 DIAGNOSIS — G4734 Idiopathic sleep related nonobstructive alveolar hypoventilation: Secondary | ICD-10-CM

## 2020-06-24 NOTE — Patient Instructions (Signed)
It was nice to see you again.  I am glad to hear that you have felt better and have not had the abnormal sensations of vibration feeling in the legs and have also benefited from using the AutoPap.  You are fully compliant with treatment.  Please continue using your autoPAP regularly. While your insurance requires that you use PAP at least 4 hours each night on 70% of the nights, I recommend, that you not skip any nights and use it throughout the night if you can. Getting used to PAP and staying with the treatment long term does take time and patience and discipline. Untreated obstructive sleep apnea when it is moderate to severe can have an adverse impact on cardiovascular health and raise her risk for heart disease, arrhythmias, hypertension, congestive heart failure, stroke and diabetes. Untreated obstructive sleep apnea causes sleep disruption, nonrestorative sleep, and sleep deprivation. This can have an impact on your day to day functioning and cause daytime sleepiness and impairment of cognitive function, memory loss, mood disturbance, and problems focussing. Using PAP regularly can improve these symptoms.    As discussed, we will do an overnight oxygen level test, called ONO, and your DME company will call and set this up for one night, while you also use your autoPAP as usual. We will call you with the results. This is to make sure that your oxygen levels stay in the 90s, while you are treated with autoPAP for your OSA. Remember, your oxygen levels dropped into the 70s during the home sleep test.   If all goes well, we can see you back in 1 year.  If you are able to lose a significant amount of weight and would like to get reevaluated for your sleep apnea with another home sleep test, we can do this after next year's appointment or even before if you wish.

## 2020-06-24 NOTE — Progress Notes (Signed)
Subjective:    Patient ID: Kelsey [name] is a 55 y.o. female.  HPI     Interim history:   Kelsey [name] is a 55 year old right-handed woman with an underlying medical history of degenerative lumbar spine disease with status post surgery in 2019, hyperlipidemia, migraine headaches, and obesity, who Presents for follow-up consultation of Kelsey trembling versus vibrating sensation that she feels at night, also Kelsey sleep apnea after starting AutoPap therapy.  The patient is unaccompanied today.  I first met Kelsey at the request of Kelsey [name] on 02/04/2020, at which time she reported an approximately 74-monthhistory of a feeling of Kelsey limbs vibrating especially at night.  She denied any obvious tremors.  She did not have any significant tremor on examination and overall neurological exam was unremarkable.  We did check some blood work and since she did not sleep very well, she was also advised to proceed with a sleep study.  In addition, she was advised to proceed with a brain MRI with and without contrast.  Blood work from 02/04/2020 showed normal findings including CK level, ESR, B12, RPR, rheumatoid factor, CRP, ANA, B6, A1c.  We called Kelsey with Kelsey test results.  Kelsey brain MRI with and without contrast from 02/26/2020 showed:    IMPRESSION:    Unremarkable MRI brain with and without contrast.  No acute findings.  She had a home sleep test on 03/02/2020 which indicated moderate obstructive sleep apnea with a total AHI of 18/h, O2 nadir of 71%.  She was advised to start AutoPap therapy.  Kelsey set up date was around 05/22/2020.  She was started on a LGaribaldi  Today, 06/24/2020: I reviewed Kelsey AutoPap compliance data from the past 30 days, she has used Kelsey machine 30 out of 30 days with percent use days greater than 4 hours at 100%, indicating superb compliance, average usage of 8 hours and 48 minutes, average pressure for the 95th percentile at 8 cm.  She does not have any high leak.  Average AHI  1/h.  She reports having done well with AutoPap therapy.  She sleeps better, husband also reports that she seems to sleep much more quieter and restful, she also reports not having experienced those abnormal trembling or vibrating sensations.  One time she woke up and could not breathe very well and propped up a little bit more, has slept well overall.  She is quite motivated to continue with treatment.  She is working on weight loss.  She is overall quite pleased with Kelsey outcome thus far.  She tried a nasal pillows interface in the beginning but it caused dryness in Kelsey nose and soreness.  She switched to a nasal mask and it is quite comfortable.  She has no new complaints.  The patient's allergies, current medications, family history, past medical history, past social history, past surgical history and problem list were reviewed and updated as appropriate.    Previously:   02/04/20: (She) reports an approximately 670-monthistory of intermittent sensation of Kelsey limbs vibrating, happens mostly at night, no obvious tremors, no abnormal sensation such as tingling or electric shocklike sensation, feels like a vibration inside and has woken Kelsey up from sleep, often happens in the early morning hours.  It can happen once a week, has become a little bit worse or more noticeable or more frequent.  She has not had any obvious tremors, no changes in Kelsey balance, no significant headaches, migraines are generally under good control.  She takes amitriptyline at night, she has not had any new medications or new over-the-counter supplements.  She does endorse stress and not sleeping very well, she endorses anxiety.  Nevertheless, nothing to tie in with Kelsey symptoms in terms of correlation.  She has not had any visual symptoms, no blurry vision, no double vision, no loss of vision.  She does report a family history of Parkinson's disease affecting Kelsey father and paternal grandfather.  She also has a family history of sleep  apnea.  She had a sleep study several years ago, maybe as long as 20 years ago for concern for sleep apnea but it was negative at the time.  She does report ongoing issues with nonrestorative sleep and snoring.  I reviewed your office note from December 02, 2019.  She had a TSH and BMP checked at the time.  Results are not available for my review today.  She also reports joint pain and joint stiffness in various joints including elbows and right shoulder joint, no significant knee pain though.  She has not had any major obvious swelling in Kelsey joints.  She does have a family history of arthritis.  Kelsey Past Medical History Is Significant For: Past Medical History:  Diagnosis Date  . Abnormal Pap smear   . Bulging lumbar disc    L4, l5--scheduled for surgery 05-04-18--Dr. Elsner  . Hyperlipidemia   . Infertility, female    no method of birth from late 20's through the 54's,  Headaches on OCP  . Migraine    with aura    Kelsey Past Surgical History Is Significant For: Past Surgical History:  Procedure Laterality Date  . ABDOMINAL EXPOSURE N/A 05/04/2018   Procedure: ABDOMINAL EXPOSURE;  Surgeon: Marty Heck, MD;  Location: [facility];  Service: Vascular;  Laterality: N/A;  . ANKLE SURGERY  1998/1999   left ankle fracture with ORIF  . ANTERIOR LUMBAR FUSION N/A 05/04/2018   Procedure: Lumbar Four-Five Anterior  lumbar interbody fusion;  Surgeon: Kristeen Miss, MD;  Location: [facility];  Service: Neurosurgery;  Laterality: N/A;  anterior  . CARPAL TUNNEL RELEASE  05/21/12   right wrist  . COLPOSCOPY     CIN I  . HIP SURGERY Left 03-06-13   --[facility] - Torn Labrum from injury  . LASIK    . MOLE REMOVAL  2000   off back - benign    Kelsey Family History Is Significant For: Family History  Problem Relation Age of Onset  . Diabetes Mother   . Alzheimer's disease Mother   . Hypertension Father   . Diabetes Father   . Heart disease Father   . Heart failure Father   . Hypertension Brother    . Colon cancer Brother 82       Colectomy with Appendectomy  . Diabetes Brother   . Alcohol abuse Brother   . Alzheimer's disease Maternal Grandmother   . Alzheimer's disease Maternal Aunt     Kelsey Social History Is Significant For: Social History   Socioeconomic History  . Marital status: Married    Spouse name: Not on file  . Number of children: Not on file  . Years of education: Not on file  . Highest education level: Not on file  Occupational History  . Not on file  Tobacco Use  . Smoking status: Never Smoker  . Smokeless tobacco: Never Used  Vaping Use  . Vaping Use: Never used  Substance and Sexual Activity  . Alcohol use: Yes  Alcohol/week: 1.0 standard drink    Types: 1 Glasses of wine per week    Comment: 1 month  . Drug use: No  . Sexual activity: Yes    Partners: Male    Birth control/protection: Pill    Comment: Norethindrone  Other Topics Concern  . Not on file  Social History Narrative  . Not on file   Social Determinants of Health   Financial Resource Strain: Not on file  Food Insecurity: Not on file  Transportation Needs: Not on file  Physical Activity: Not on file  Stress: Not on file  Social Connections: Not on file    Kelsey Allergies Are:  Allergies  Allergen Reactions  . Lodine [Etodolac] Hives          Kelsey Current Medications Are:  Outpatient Encounter Medications as of 06/24/2020  Medication Sig  . amitriptyline (ELAVIL) 10 MG tablet Take 10 mg by mouth at bedtime.   Marland Kitchen BLACK ELDERBERRY PO Take 1 tablet by mouth daily.  . calcium carbonate (OS-CAL) 600 MG TABS tablet Take 600 mg by mouth 2 (two) times daily with a meal.  . doxycycline (VIBRA-TABS) 100 MG tablet Take 100 mg by mouth 2 (two) times daily as needed (irritation).   Marland Kitchen esomeprazole (NEXIUM) 20 MG capsule Take 20 mg by mouth daily at 12 noon.  Marland Kitchen GELATIN PO Take by mouth.  . Glucosamine HCl (GLUCOSAMINE PO) Take by mouth. 2 tabs daily  . Magnesium 500 MG CAPS Take 1  capsule by mouth daily.  Marland Kitchen MELATONIN PO Take by mouth.  . methocarbamol (ROBAXIN) 500 MG tablet Take 1 tablet (500 mg total) by mouth every 6 (six) hours as needed for muscle spasms.  . Multiple Vitamins-Minerals (CENTRUM ULTRA WOMENS PO) Take 1 tablet by mouth daily.  . norethindrone (MICRONOR) 0.35 MG tablet TAKE 1 TABLET BY MOUTH EVERY DAY  . ondansetron (ZOFRAN-ODT) 4 MG disintegrating tablet Take 1 tablet by mouth as needed. Takes if has migraine  . Probiotic Product (PROBIOTIC-10 PO) Take by mouth.  Marland Kitchen PROMETHAZINE HCL PO Take by mouth.  . vitamin B-12 (CYANOCOBALAMIN) 500 MCG tablet Take 500 mcg by mouth daily.  . [DISCONTINUED] cholecalciferol (VITAMIN D3) 25 MCG (1000 UT) tablet Take 1,000 Units by mouth daily.  . [DISCONTINUED] vitamin C (ASCORBIC ACID) 250 MG tablet Take 250 mg by mouth daily.   No facility-administered encounter medications on file as of 06/24/2020.  :  Review of Systems:  Out of a complete 14 point review of systems, all are reviewed and negative with the exception of these symptoms as listed below: Review of Systems  Neurological:       RM 1, alone. Initial CPAP f/u. DME: [facility]. Original nose piece hurt  (nostril). Switched Kelsey to one that goes over Kelsey nose instead and she tolerates well. Wearing every night for at least 4 hours or more.  Epworth Sleepiness Scale 0= would never doze 1= slight chance of dozing 2= moderate chance of dozing 3= high chance of dozing  Sitting and reading: 0 Watching TV:0 Sitting inactive in a public place (ex. Theater or meeting):0 As a passenger in a car for an hour without a break:0 Lying down to rest in the afternoon:1 Sitting and talking to someone:0 Sitting quietly after lunch (no alcohol):0 In a car, while stopped in traffic:0 Total: 1     Objective:  Neurological Exam  Physical Exam Physical Examination:   Vitals:   06/24/20 0703  BP: (!) 149/84  Pulse: 70  SpO2: 97%    General Examination: The  patient is a very pleasant 55 y.o. female in no acute distress. She appears well-developed and well-nourished and well groomed.   HEENT: Normocephalic, atraumatic, pupils are equal, round and reactive to light. Extraocular tracking is good without limitation to gaze excursion or nystagmus noted. Normal smooth pursuit is noted. Hearing is grossly intact. Face is symmetric with normal facial animation and normal facial sensation. Speech is clear with no dysarthria noted. There is no hypophonia. There is no lip, neck/head, jaw or voice tremor. Neck is supple with full range of passive and active motion. There are no carotid bruits on auscultation. Oropharynx exam reveals: mild mouth dryness, good dental hygiene and mild airway crowding.  Tongue protrudes centrally in palate elevates symmetrically.  Chest: Clear to auscultation without wheezing, rhonchi or crackles noted.  Heart: S1+S2+0, regular and normal without murmurs, rubs or gallops noted.   Abdomen: Soft, non-tender and non-distended.  Extremities: There is no pitting edema in the distal lower extremities bilaterally.  Skin: Warm and dry without trophic changes noted.  Musculoskeletal: exam reveals no obvious joint deformities, tenderness or joint swelling or erythema.   Neurologically:  Mental status: The patient is awake, alert and oriented in all 4 spheres. Kelsey immediate and remote memory, attention, language skills and fund of knowledge are appropriate. There is no evidence of aphasia, agnosia, apraxia or anomia. Speech is clear with normal prosody and enunciation. Thought process is linear. Mood is normal and affect is normal.  Cranial nerves II - XII are as described above under HEENT exam.  Motor exam: Normal bulk, strength and tone is noted. There is no drift or tremor.    (On 02/04/2020: Archimedes spiral drawing she has no trembling with the right or left hand, slight insecurity with Kelsey left hand which is Kelsey nondominant  hand. Handwriting is legible, not tremulous, not micrographic.)    Fine motor skills and coordination: grossly intact.  Cerebellar testing: No dysmetria or intention tremor. There is no truncal or gait ataxia.  Sensory exam: intact to light touch in the upper and lower extremities.  Gait, station and balance: She stands without difficulty. No veering to one side is noted. No leaning to one side is noted. Posture is age-appropriate and stance is narrow based. Gait shows normal stride length and normal pace. No problems turning are noted.  Assessment and Plan:   In summary, ALEECIA TAPIA is a very pleasant 55 year old female with an underlying medical history of degenerative lumbar spine disease with status post surgery in 2019, hyperlipidemia, migraine headaches, and obesity, who presents for follow-up consultation of Kelsey sleep disturbance as well as a abnormal sensation of vibrating affecting Kelsey limbs intermittently, particularly at night. She had work-up with blood testing and a brain MRI, all benign, Kelsey home sleep test indicated moderate obstructive sleep apnea and she has established treatment for the past 1+ month with AutoPap therapy with excellent compliance and good results reported. She has not had any of these abnormal vibrating sensations even. She is doing quite well at this time, she is commended for Kelsey AutoPap treatment adherence and advised to continue with Kelsey current management. Neurological exam has been benign as well. We talked about Kelsey test results today. She is advised to continue to work on weight loss and at this juncture advised to do an overnight pulse oximetry test while on Kelsey AutoPap therapy to make sure Kelsey oxygen saturations are adequate while on treatment as Kelsey  home sleep test indicated a desaturation nadir in the low 70s. We will call Kelsey with Kelsey pulse oximetry test results and if all goes well we can follow Kelsey in 1 year routinely. She is working on weight loss and  once she is able to reduce Kelsey weight, we can always consider redoing Kelsey home sleep test for reevaluation. She is advised to call us with any interim questions or concerns and she should hear from [place] regarding Kelsey pulse oximetry test results soon. I answered all Kelsey questions today and she was in agreement with the plan. I spent 30 minutes in total face-to-face time and in reviewing records during pre-charting, more than 50% of which was spent in counseling and coordination of care, reviewing test results, reviewing medications and treatment regimen and/or in discussing or reviewing the diagnosis of OSA, the prognosis and treatment options. Pertinent laboratory and imaging test results that were available during this visit with the patient were reviewed by me and considered in my medical decision making (see chart for details).

## 2020-07-06 ENCOUNTER — Telehealth: Payer: Self-pay | Admitting: Neurology

## 2020-07-06 NOTE — Telephone Encounter (Signed)
Pt called, I was instructed to call if I had not heard from [facility]. Has been a week and [facility] has not called to schedule my oxygen test. Would like a call from the nurse.

## 2020-07-06 NOTE — Telephone Encounter (Signed)
Received feed back from [facility] stating order was sent on 06/24/20 for processing.  I have updated the pt and advised she should be hearing soon scheduling. Pt advised to call back if she has not heard by Thursday of this week.

## 2020-07-06 NOTE — Telephone Encounter (Signed)
I have reached out to [facility] to get an update on this order.

## 2020-08-06 ENCOUNTER — Telehealth: Payer: Self-pay

## 2020-08-06 NOTE — Telephone Encounter (Signed)
I called pt and advised we had received the results of the ONO but results were inconclusive( appears lead my have become detached per Dr. Frances Furbish). I have reached out to [facility] and they are going to process for an additional ONO. Pt was advised of this info and was advised to call back if she has not heard soon about repeating ONO.  Pt was advised once we receive results we will call to review.  PT was agreeable.

## 2020-08-18 ENCOUNTER — Ambulatory Visit: Payer: No Typology Code available for payment source | Admitting: Obstetrics and Gynecology

## 2020-08-18 ENCOUNTER — Other Ambulatory Visit (HOSPITAL_COMMUNITY)
Admission: RE | Admit: 2020-08-18 | Discharge: 2020-08-18 | Disposition: A | Discharge status: Home / Self Care | Payer: No Typology Code available for payment source | Source: Ambulatory Visit | Attending: Obstetrics and Gynecology | Admitting: Obstetrics and Gynecology

## 2020-08-18 ENCOUNTER — Encounter: Payer: Self-pay | Admitting: Obstetrics and Gynecology

## 2020-08-18 ENCOUNTER — Other Ambulatory Visit: Payer: Self-pay

## 2020-08-18 DIAGNOSIS — Z01419 Encounter for gynecological examination (general) (routine) without abnormal findings: Secondary | ICD-10-CM | POA: Insufficient documentation

## 2020-08-18 MED ORDER — NORETHINDRONE 0.35 MG PO TABS: 1.0000 | ORAL_TABLET | Freq: Every day | ORAL | 3 refills | Status: DC

## 2020-08-18 NOTE — Patient Instructions (Signed)

## 2020-08-18 NOTE — Progress Notes (Signed)
55 y.o. G62P0000 Married Caucasian female here for annual exam.    4 menses in 2021. Some hot flashes.  Decreased libido.   Working with Dr. Loreta Ave to regulate her bowel function.  Not having BMs regularly.   Stressful project at work last year.   PCP: Gweneth Dimitri, MD    Patient's last menstrual period was 08/17/2020 (exact date).           Sexually active: No.  The current method of family planning is oral progesterone-only contraceptive.    Exercising: No.  The patient does not participate in regular exercise at present. Smoker:  no  Health Maintenance: Pap: 02-17-16 Neg:Neg HR HPV, 01-28-13 Neg:Neg HR HPV History of abnormal Pap:  Yes, Hx of CIN I in 2002 and 2003. Hx CIN I and Colpo 2002. Colpo WNL 2003--no treatment MMG:  04-30-20 3D/Neg/BiRads1 Colonoscopy:  04-17-20 polyp;next 5 years  BMD:  n/a  Result  n/a TDaP:  02-10-15 Gardasil:   no HIV:Neg years ago Hep C:Neg years ago Screening Labs:  PCP   reports that she has never smoked. She has never used smokeless tobacco. She reports current alcohol use of about 1.0 standard drink of alcohol per week. She reports that she does not use drugs.  Past Medical History:  Diagnosis Date  . Abnormal Pap smear   . Bulging lumbar disc    L4, l5--scheduled for surgery 05-04-18--Dr. Elsner  . Hidradenitis   . Hyperlipidemia   . Infertility, female    no method of birth from late 20's through the 52's,  Headaches on OCP  . Migraine    with aura  . Sleep apnea 2021    Past Surgical History:  Procedure Laterality Date  . ABDOMINAL EXPOSURE N/A 05/04/2018   Procedure: ABDOMINAL EXPOSURE;  Surgeon: Cephus Shelling, MD;  Location: [facility] OR;  Service: Vascular;  Laterality: N/A;  . ANKLE SURGERY  1998/1999   left ankle fracture with ORIF  . ANTERIOR LUMBAR FUSION N/A 05/04/2018   Procedure: Lumbar Four-Five Anterior  lumbar interbody fusion;  Surgeon: Barnett Abu, MD;  Location: [facility] OR;  Service: Neurosurgery;  Laterality:  N/A;  anterior  . CARPAL TUNNEL RELEASE  05/21/12   right wrist  . COLPOSCOPY     CIN I  . HIP SURGERY Left 03-06-13   --[facility] - Torn Labrum from injury  . LASIK    . MOLE REMOVAL  2000   off back - benign    Current Outpatient Medications  Medication Sig Dispense Refill  . amitriptyline (ELAVIL) 10 MG tablet Take 10 mg by mouth at bedtime.   3  . BLACK ELDERBERRY PO Take 1 tablet by mouth daily.    . calcium carbonate (OS-CAL) 600 MG TABS tablet Take 600 mg by mouth 2 (two) times daily with a meal.    . docusate sodium (COLACE) 100 MG capsule     . doxycycline (VIBRA-TABS) 100 MG tablet Take 100 mg by mouth 2 (two) times daily as needed (irritation).   3  . esomeprazole (NEXIUM) 20 MG capsule Take 20 mg by mouth daily at 12 noon.    Marland Kitchen GELATIN PO Take by mouth.    . Glucosamine HCl (GLUCOSAMINE PO) Take by mouth. 2 tabs daily    . Isometheptene-Dichloral-APAP (MIDRIN PO) Take 1 tablet by mouth as needed.    . Magnesium 500 MG CAPS Take 1 capsule by mouth daily.    Marland Kitchen MELATONIN PO Take by mouth.    . methocarbamol (ROBAXIN)  500 MG tablet Take 1 tablet (500 mg total) by mouth every 6 (six) hours as needed for muscle spasms. 60 tablet 1  . Multiple Vitamins-Minerals (CENTRUM ULTRA WOMENS PO) Take 1 tablet by mouth daily.    . ondansetron (ZOFRAN-ODT) 4 MG disintegrating tablet Take 1 tablet by mouth as needed. Takes if has migraine    . Probiotic Product (PROBIOTIC-10 PO) Take by mouth.    Marland Kitchen PROMETHAZINE HCL PO Take by mouth.    . vitamin B-12 (CYANOCOBALAMIN) 500 MCG tablet Take 500 mcg by mouth daily.    . norethindrone (MICRONOR) 0.35 MG tablet Take 1 tablet (0.35 mg total) by mouth daily. 84 tablet 3   No current facility-administered medications for this visit.    Family History  Problem Relation Age of Onset  . Diabetes Mother   . Alzheimer's disease Mother   . Hypertension Father   . Diabetes Father   . Heart disease Father   . Heart failure Father   . Hypertension  Brother   . Colon cancer Brother 87       Colectomy with Appendectomy  . Diabetes Brother   . Alcohol abuse Brother   . Alzheimer's disease Maternal Grandmother   . Alzheimer's disease Maternal Aunt     Review of Systems  Gastrointestinal: Positive for constipation.       Bloating and gas--seeing Dr.Mann  All other systems reviewed and are negative.   Exam:   BP 118/80 (Cuff Size: Large)   Pulse 62   Ht 5\' 4"  (1.626 m)   Wt 208 lb (94.3 kg)   LMP 08/17/2020 (Exact Date)   SpO2 99%   BMI 35.70 kg/m     General appearance: alert, cooperative and appears stated age Head: normocephalic, without obvious abnormality, atraumatic Neck: no adenopathy, supple, symmetrical, trachea midline and thyroid normal to inspection and palpation Lungs: clear to auscultation bilaterally Breasts: normal appearance, no masses or tenderness, No nipple retraction or dimpling, No nipple discharge or bleeding, No axillary adenopathy.  Right axillary area with boil.  Heart: regular rate and rhythm Abdomen: soft, non-tender; no masses, no organomegaly Extremities: extremities normal, atraumatic, no cyanosis or edema Skin: skin color, texture, turgor normal. No rashes or lesions Lymph nodes: cervical, supraclavicular, and axillary nodes normal. Neurologic: grossly normal  Pelvic: External genitalia:  no lesions              No abnormal inguinal nodes palpated.              Urethra:  normal appearing urethra with no masses, tenderness or lesions              Bartholins and Skenes: normal                 Vagina: normal appearing vagina with normal color and discharge, no lesions              Cervix: no lesions.  Dark clot removed from vagina.               Pap taken: Yes.   Bimanual Exam:  Uterus:  normal size, contour, position, consistency, mobility, non-tender              Adnexa: no mass, fullness, tenderness              Rectal exam: Yes.  .  Confirms.              Anus:  normal sphincter tone, no  lesions  Chaperone was  present for exam.  Assessment:   Well woman visit with normal exam. Remote hx CIN I.  Dysmenorrhea controlled with POP.  Migraine with aura.  FH colon cancer - brother. Hidradenitis.   Plan: Mammogram screening discussed. Self breast awareness reviewed. Pap and HR HPV as above. Guidelines for Calcium, Vitamin D, regular exercise program including cardiovascular and weight bearing exercise. We talked about the constipating effect of calcium and iron in supplements.  Refill of Micronor for one year.  Follow up annually and prn.

## 2020-08-20 LAB — CYTOLOGY - PAP
Adequacy: ABSENT
Comment: NEGATIVE
Diagnosis: NEGATIVE
High risk HPV: NEGATIVE

## 2020-09-07 ENCOUNTER — Telehealth: Payer: Self-pay | Admitting: Neurology

## 2020-09-07 NOTE — Telephone Encounter (Addendum)
I called the pt and advised of result. She verbalized understanding and will keep f/u as scheduled for next year.

## 2020-09-07 NOTE — Telephone Encounter (Signed)
I received patient's overnight pulse oximetry test results: She had an ONO on AutoPap therapy on 09/02/2020 through 09/03/2020, duration of test time is 10 hours and 17 minutes.  Average oxygen saturation was 95%, nadir was 90%.  Time below or at 88% saturation was 0 minutes.  Average pulse rate was 63, nadir was 50 bpm, high was 89 bpm.  Please call patient and advise her that her oxygen saturation test looked good while on AutoPap therapy.  She can maintain treatment on her AutoPap at the current settings and follow-up as planned next year.

## 2021-03-29 ENCOUNTER — Other Ambulatory Visit: Payer: Self-pay | Admitting: Obstetrics and Gynecology

## 2021-03-29 DIAGNOSIS — Z1231 Encounter for screening mammogram for malignant neoplasm of breast: Secondary | ICD-10-CM

## 2021-05-03 ENCOUNTER — Ambulatory Visit
Admission: RE | Admit: 2021-05-03 | Discharge: 2021-05-03 | Disposition: A | Discharge status: Home / Self Care | Payer: No Typology Code available for payment source | Source: Ambulatory Visit | Attending: Obstetrics and Gynecology | Admitting: Obstetrics and Gynecology

## 2021-05-03 DIAGNOSIS — Z1231 Encounter for screening mammogram for malignant neoplasm of breast: Secondary | ICD-10-CM

## 2021-05-05 ENCOUNTER — Other Ambulatory Visit: Payer: Self-pay | Admitting: Obstetrics and Gynecology

## 2021-05-05 DIAGNOSIS — R928 Other abnormal and inconclusive findings on diagnostic imaging of breast: Secondary | ICD-10-CM

## 2021-06-08 ENCOUNTER — Ambulatory Visit
Admission: RE | Admit: 2021-06-08 | Discharge: 2021-06-08 | Disposition: A | Discharge status: Home / Self Care | Payer: No Typology Code available for payment source | Source: Ambulatory Visit | Attending: Obstetrics and Gynecology | Admitting: Obstetrics and Gynecology

## 2021-06-08 DIAGNOSIS — R928 Other abnormal and inconclusive findings on diagnostic imaging of breast: Secondary | ICD-10-CM

## 2021-06-24 ENCOUNTER — Other Ambulatory Visit: Payer: Self-pay

## 2021-06-24 ENCOUNTER — Ambulatory Visit (INDEPENDENT_AMBULATORY_CARE_PROVIDER_SITE_OTHER): Payer: No Typology Code available for payment source | Admitting: Neurology

## 2021-06-24 ENCOUNTER — Encounter: Payer: Self-pay | Admitting: Neurology

## 2021-06-24 VITALS — BP 126/68 | HR 64 | Ht 64.0 in | Wt 229.8 lb

## 2021-06-24 DIAGNOSIS — Z9989 Dependence on other enabling machines and devices: Secondary | ICD-10-CM | POA: Diagnosis not present

## 2021-06-24 DIAGNOSIS — G4733 Obstructive sleep apnea (adult) (pediatric): Secondary | ICD-10-CM | POA: Diagnosis not present

## 2021-06-24 NOTE — Patient Instructions (Signed)
It was nice to see you again today.  I am glad to hear that you are doing well with your AutoPap machine.  Last year's oxygen test at home was also good.   Keep up the good work!  Continue to work on weight loss.    Please continue using your CPAP regularly. While your insurance requires that you use CPAP at least 4 hours each night on 70% of the nights, I recommend, that you not skip any nights and use it throughout the night if you can. Getting used to CPAP and staying with the treatment long term does take time and patience and discipline. Untreated obstructive sleep apnea when it is moderate to severe can have an adverse impact on cardiovascular health and raise her risk for heart disease, arrhythmias, hypertension, congestive heart failure, stroke and diabetes. Untreated obstructive sleep apnea causes sleep disruption, nonrestorative sleep, and sleep deprivation. This can have an impact on your day to day functioning and cause daytime sleepiness and impairment of cognitive function, memory loss, mood disturbance, and problems focussing. Using CPAP regularly can improve these symptoms. We can see you in 1 year, you can see one of our nurse practitioners.  We can offer you a MyChart virtual visit if you prefer.

## 2021-06-24 NOTE — Progress Notes (Signed)
Subjective:    Patient ID: Kelsey [name] is a 56 y.o. female.  HPI    Interim history:   Kelsey [name] is a 56 year old right-handed woman with an underlying medical history of degenerative lumbar spine disease with status post surgery in 2019, hyperlipidemia, migraine headaches, and obesity, who presents for Kelsey [name] yearly follow-up of Kelsey [name] sleep apnea, on AutoPap therapy.  The patient is unaccompanied today. I last saw Kelsey [name] on 06/24/2020, at which time Kelsey [name] was fully compliant with AutoPap and reported feeling better.  Kelsey [name] no longer had sensations of trembling or vibration inside.  I suggested we pursue an ONO to make sure Kelsey [name] oxygen saturations are adequate while on AutoPap therapy.  Kelsey [name] had a pulse ox overnight on 09/03/2020 which indicated good oxygen saturations overnight while on AutoPap therapy.  Today, 06/24/21: I reviewed Kelsey [name] compliance data on Kelsey [name] phone app.  Kelsey [name] has an average usage of 8+ hours typically and has been consistent with Kelsey [name] usage.  Average AHI right around 1/h, average pressure right around 8 cm.  Unfortunately, we were not able to get an actual download from Kelsey [name] machine despite Kelsey [name] bring the machine with the SD card and the power cord, we tried a different SD card from our office as well which did not transfer the data. Kelsey [name] reports doing well with regard to Kelsey [name] OSA, compliant with the autoPAP. Kelsey [name] has had LBP and went through Kelsey [name], then started having issues with Kelsey [name] L hip, which was operated on some years ago. Kelsey [name] then started having R hip pain. Kelsey [name] is seeing ortho, Dr. Maureen Ralphs and his PA and is scheduled to get a steroid injection in about 2 weeks. Kelsey [name] weight had gone down, then back up, due to inability to exercise. Kelsey [name] has been started on generic Lipitor recently per PCP.  Kelsey [name] has used a cane as needed.  Kelsey [name] has had intermittent cramping sensation in Kelsey [name] lower body, similar to the vibration/trembling Kelsey [name] has experienced before, but nothing sustained or alarming or progressive.     The  patient's allergies, current medications, family history, past medical history, past social history, past surgical history and problem list were reviewed and updated as appropriate.      Previously:  I first met Kelsey [name] at the request of Kelsey [name] primary care physician on 02/04/2020, at which time Kelsey [name] reported an approximately 72-monthhistory of a feeling of Kelsey [name] limbs vibrating especially at night.  Kelsey [name] denied any obvious tremors.  Kelsey [name] did not have any significant tremor on examination and overall neurological exam was unremarkable.  We did check some blood work and since Kelsey [name] did not sleep very well, Kelsey [name] was also advised to proceed with a sleep study.  In addition, Kelsey [name] was advised to proceed with a brain MRI with and without contrast.  Blood work from 02/04/2020 showed normal findings including CK level, ESR, B12, RPR, rheumatoid factor, CRP, ANA, B6, A1c.  We called Kelsey [name] with Kelsey [name] test results.  Kelsey [name] brain MRI with and without contrast from 02/26/2020 showed:    IMPRESSION:    Unremarkable MRI brain with and without contrast.  No acute findings.   Kelsey [name] had a home sleep test on 03/02/2020 which indicated moderate obstructive sleep apnea with a total AHI of 18/h, O2 nadir of 71%.  Kelsey [name] was advised to start AutoPap therapy.  Kelsey [name] set up date was around 05/22/2020.  Kelsey [name] was started on a [place]   I reviewed Kelsey [name] AutoPap compliance data from the past 30 days, Kelsey [name] has used  Kelsey [name] machine 30 out of 30 days with percent use days greater than 4 hours at 100%, indicating superb compliance, average usage of 8 hours and 48 minutes, average pressure for the 95th percentile at 8 cm.  Kelsey [name] does not have any high leak.  Average AHI 1/h.      02/04/20: (Kelsey [name]) reports an approximately 48-monthhistory of intermittent sensation of Kelsey [name] limbs vibrating, happens mostly at night, no obvious tremors, no abnormal sensation such as tingling or electric shocklike sensation, feels like a vibration inside and has woken Kelsey [name] up from sleep, often happens  in the early morning hours.  It can happen once a week, has become a little bit worse or more noticeable or more frequent.  Kelsey [name] has not had any obvious tremors, no changes in Kelsey [name] balance, no significant headaches, migraines are generally under good control.  Kelsey [name] takes amitriptyline at night, Kelsey [name] has not had any new medications or new over-the-counter supplements.  Kelsey [name] does endorse stress and not sleeping very well, Kelsey [name] endorses anxiety.  Nevertheless, nothing to tie in with Kelsey [name] symptoms in terms of correlation.  Kelsey [name] has not had any visual symptoms, no blurry vision, no double vision, no loss of vision.  Kelsey [name] does report a family history of Parkinson's disease affecting Kelsey [name] father and paternal grandfather.  Kelsey [name] also has a family history of sleep apnea.  Kelsey [name] had a sleep study several years ago, maybe as long as 20 years ago for concern for sleep apnea but it was negative at the time.  Kelsey [name] does report ongoing issues with nonrestorative sleep and snoring.  I reviewed your office note from December 02, 2019.  Kelsey [name] had a TSH and BMP checked at the time.  Results are not available for my review today.  Kelsey [name] also reports joint pain and joint stiffness in various joints including elbows and right shoulder joint, no significant knee pain though.  Kelsey [name] has not had any major obvious swelling in Kelsey [name] joints.  Kelsey [name] does have a family history of arthritis.    Kelsey [name] Past Medical History Is Significant For: Past Medical History:  Diagnosis Date   Abnormal Pap smear    Bulging lumbar disc    L4, l5--scheduled for surgery 05-04-18--Dr. Elsner   Hidradenitis    Hyperlipidemia    Infertility, female    no method of birth from late 20's through the 40's,  Headaches on OCP   Migraine    with aura   Sleep apnea 2021    Kelsey [name] Past Surgical History Is Significant For: Past Surgical History:  Procedure Laterality Date   ABDOMINAL EXPOSURE N/A 05/04/2018   Procedure: ABDOMINAL EXPOSURE;  Surgeon: CMarty Heck MD;  [facility]  Service: Vascular;  Laterality: N/A;   ANKLE SURGERY  1998/1999   left ankle fracture with ORIF   ANTERIOR LUMBAR FUSION N/A 05/04/2018   Procedure: Lumbar Four-Five Anterior  lumbar interbody fusion;  Surgeon: EKristeen Miss MD;  [place] [facility]  Service: Neurosurgery;  Laterality: N/A;  anterior   CARPAL TUNNEL RELEASE  05/21/12   right wrist   COLPOSCOPY     CIN I   [place]   [place] - benign    Kelsey [name] Family History Is Significant For: Family History  Problem Relation Age of Onset   Diabetes Mother    Alzheimer's disease Mother    Hypertension Father  Diabetes Father    Heart disease Father    Heart failure Father    Sleep apnea Father    Hypertension Brother    Colon cancer Brother 57       Colectomy with Appendectomy   Sleep apnea Brother    Diabetes Brother    Alcohol abuse Brother    Alzheimer's disease Maternal Aunt    Alzheimer's disease Maternal Grandmother     Kelsey [name] Social History Is Significant For: Social History   Socioeconomic History   Marital status: Married    Spouse name: Not on file   Number of children: Not on file   Years of education: Not on file   Highest education level: Not on file  Occupational History   Not on file  Tobacco Use   Smoking status: Never   Smokeless tobacco: Never  Vaping Use   Vaping Use: Never used  Substance and Sexual Activity   Alcohol use: Yes    Alcohol/week: 1.0 standard drink    Types: 1 Glasses of wine per week    Comment: 1 month   Drug use: No   Sexual activity: Not Currently    Partners: Male    Birth control/protection: Pill    Comment: Norethindrone  Other Topics Concern   Not on file  Social History Narrative   Not on file   Social Determinants of Health   Financial Resource Strain: Not on file  Food Insecurity: Not on file  Transportation Needs: Not on file  Physical Activity: Not on file   Stress: Not on file  Social Connections: Not on file    Kelsey [name] Allergies Are:  Allergies  Allergen Reactions   Etodolac Hives, Rash and Other (See Comments)       :   Kelsey [name] Current Medications Are:  Outpatient Encounter Medications as of 06/24/2021  Medication Sig   amitriptyline (ELAVIL) 10 MG tablet Take 10 mg by mouth at bedtime.    BLACK ELDERBERRY PO Take 1 tablet by mouth daily.   calcium carbonate (OS-CAL) 600 MG TABS tablet Take 600 mg by mouth 2 (two) times daily with a meal.   docusate sodium (COLACE) 100 MG capsule    doxycycline (VIBRA-TABS) 100 MG tablet Take 100 mg by mouth 2 (two) times daily as needed (irritation).    esomeprazole (NEXIUM) 20 MG capsule Take 20 mg by mouth daily at 12 noon.   GELATIN PO Take by mouth.   Glucosamine HCl (GLUCOSAMINE PO) Take by mouth. 2 tabs daily   Isometheptene-Dichloral-APAP (MIDRIN PO) Take 1 tablet by mouth as needed.   Magnesium 500 MG CAPS Take 1 capsule by mouth daily.   MELATONIN PO Take by mouth.   methocarbamol (ROBAXIN) 500 MG tablet Take 1 tablet (500 mg total) by mouth every 6 (six) hours as needed for muscle spasms.   Multiple Vitamins-Minerals (CENTRUM ULTRA WOMENS PO) Take 1 tablet by mouth daily.   norethindrone (MICRONOR) 0.35 MG tablet Take 1 tablet (0.35 mg total) by mouth daily.   ondansetron (ZOFRAN-ODT) 4 MG disintegrating tablet Take 1 tablet by mouth as needed. Takes if has migraine   Probiotic Product (PROBIOTIC-10 PO) Take by mouth.   PROMETHAZINE HCL PO Take by mouth.   vitamin B-12 (CYANOCOBALAMIN) 500 MCG tablet Take 500 mcg by mouth daily.   No facility-administered encounter medications on file as of 06/24/2021.  :  Review of Systems:  Out of a complete 14 point review of systems, all are reviewed and negative with the exception  of these symptoms as listed below:  Review of Systems  Neurological:        Kelsey [name] is here for CPAP follow up . Kelsey [name] has no questions or concerns for today's visit   Objective:   Neurological Exam  Physical Exam Physical Examination:   Vitals:   06/24/21 0717  BP: 126/68  Pulse: 64    General Examination: The patient is a very pleasant 56 y.o. female in no acute distress. Kelsey [name] appears well-developed and well-nourished and well groomed.   HEENT: Normocephalic, atraumatic, pupils are equal, round and reactive to light. Extraocular tracking is good without limitation to gaze excursion or nystagmus noted. Normal smooth pursuit is noted. Hearing is grossly intact. Face is symmetric with normal facial animation and normal facial sensation. Speech is clear with no dysarthria noted. There is no hypophonia. There is no lip, neck/head, jaw or voice tremor. Neck is supple with full range of passive and active motion. There are no carotid bruits on auscultation. Oropharynx exam reveals: mild mouth dryness, good dental hygiene and mild airway crowding.  Tongue protrudes centrally in palate elevates symmetrically.  Chest: Clear to auscultation without wheezing, rhonchi or crackles noted.  Heart: S1+S2+0, regular and normal without murmurs, rubs or gallops noted.    Abdomen: Soft, non-tender and non-distended.   Extremities: There is no obvious edema.   Skin: Warm and dry without trophic changes noted.   Musculoskeletal: exam reveals discomfort in right more than left hip.     Neurologically:  Mental status: The patient is awake, alert and oriented in all 4 spheres. Kelsey [name] immediate and remote memory, attention, language skills and fund of knowledge are appropriate. There is no evidence of aphasia, agnosia, apraxia or anomia. Speech is clear with normal prosody and enunciation. Thought process is linear. Mood is normal and affect is normal.  Cranial nerves II - XII are as described above under HEENT exam.  Motor exam: Normal bulk, strength and tone is noted. There is no obvious tremor.    (On 02/04/2020: Archimedes spiral drawing Kelsey [name] has no trembling with the right or left  hand, slight insecurity with Kelsey [name] left hand which is Kelsey [name] nondominant hand. Handwriting is legible, not tremulous, not micrographic.)     Fine motor skills and coordination: grossly intact.  Cerebellar testing: No dysmetria or intention tremor. There is no truncal or gait ataxia.  Sensory exam: intact to light touch in the upper and lower extremities.  Gait, station and balance: Kelsey [name] stands up without difficultslowly and walks a little slowly.     Assessment and Plan:    In summary, FRANSHESKA WILLINGHAM is a very pleasant 56 year old female with an underlying medical history of degenerative lumbar spine disease with status post surgery in 2019, bilateral hip pain with status post surgery on the left, hyperlipidemia, migraine headaches, and obesity, who presents for follow-up consultation of Kelsey [name] obstructive sleep apnea, stable on AutoPap therapy with excellent compliance.  Kelsey [name] has experienced good results with sleep apnea treatment.  Kelsey [name] had work-up for Kelsey [name] trembling in the recent past including blood work and a brain MRI, all benign.  Kelsey [name] home sleep test indicated moderate obstructive sleep apnea and Kelsey [name] has established treatment since early 2022. Neurological exam has been benign as well.  Kelsey [name] is struggling with right hip pain and low back pain.  Kelsey [name] had physical therapy and is scheduled for a steroid injection into the right hip through orthopedics.  Kelsey [name] is encouraged to continue to work on weight loss, Kelsey [name]  has had some weight fluctuation, had successfully lost some weight but unfortunately had some weight gain due to Kelsey [name] setback with the back pain and hip pain.  Kelsey [name] is advised to follow-up routinely to see one of our nurse practitioners in sleep clinic in 1 year.  An overnight pulse oximetry test while on AutoPap therapy last year in April 2022 was indicative of adequate oxygen saturations while on treatment.  Kelsey [name] is up-to-date with Kelsey [name] supplies and we can also offer Kelsey [name] a virtual visit through [place] if Kelsey [name]  prefers.  I answered all Kelsey [name] questions today and Kelsey [name] was in agreement.   I spent 30 minutes in total face-to-face time and in reviewing records during pre-charting, more than 50% of which was spent in counseling and coordination of care, reviewing test results, reviewing medications and treatment regimen and/or in discussing or reviewing the diagnosis of OSA, the prognosis and treatment options. Pertinent laboratory and imaging test results that were available during this visit with the patient were reviewed by me and considered in my medical decision making (see chart for details).

## 2021-07-20 ENCOUNTER — Other Ambulatory Visit: Payer: Self-pay

## 2021-07-20 DIAGNOSIS — Z01419 Encounter for gynecological examination (general) (routine) without abnormal findings: Secondary | ICD-10-CM

## 2021-07-20 MED ORDER — NORETHINDRONE 0.35 MG PO TABS: 1.0000 | ORAL_TABLET | Freq: Every day | ORAL | 0 refills | Status: DC

## 2021-07-20 NOTE — Telephone Encounter (Signed)
Last AEX 08/18/20--next 08/19/21. ?Mammo UTD.  ?

## 2021-08-16 NOTE — Progress Notes (Signed)
56 y.o. G39P0000 Married Caucasian female here for annual exam.   ? ?No menses for one year. ?Really hoping to stop her Micronor pills.   ? ?Taking Percocet from Dr. Celedonio Miyamoto at Dr. Verlee Rossetti office.  ?Treating back pain.  ?Also having right hip pain. ? ?Brother just passed away, probably recurrent colon cancer. ? ?PCP:   Vicente Serene, MD ? ?Patient's last menstrual period was 08/14/2020 (exact date).     ?  ?    ?Sexually active: No.  ?The current method of family planning is POPs. ?Exercising: No.   ?Smoker:  no ? ?Health Maintenance: ?Pap:  08-18-20 normal, neg HR HPV, 02/17/16 - normal, neg HR HPV ?History of abnormal Pap:  yes 2002,2003 ?MMG:  05-03-21 Calcifications Rt. Breast  06-08-21 Unilat. Rt. Mammo-normal ?Colonoscopy:  2021 polyp - due in 5 years ?BMD:   never  Result  ?TDaP:  2016 ?Gardasil:   no ?HIV:neg ?Hep C:neg ?Screening Labs:  Hb today: PCP ? ? reports that she has never smoked. She has never used smokeless tobacco. She reports current alcohol use of about 1.0 standard drink per week. She reports that she does not use drugs. ? ?Past Medical History:  ?Diagnosis Date  ? Abnormal Pap smear   ? Bulging lumbar disc   ? L4, l5--scheduled for surgery 05-04-18--Dr. Elsner  ? Hidradenitis   ? Hyperlipidemia   ? Infertility, female   ? no method of birth from late 20's through the 76's,  Headaches on OCP  ? Migraine   ? with aura  ? Sleep apnea 2021  ? ? ?Past Surgical History:  ?Procedure Laterality Date  ? ABDOMINAL EXPOSURE N/A 05/04/2018  ? Procedure: ABDOMINAL EXPOSURE;  Surgeon: Cephus Shelling, MD;  Location[facility] OR;  Service: Vascular;  Laterality: N/A;  ? ANKLE SURGERY  1998/1999  ? left ankle fracture with ORIF  ? ANTERIOR LUMBAR FUSION N/A 05/04/2018  ? Procedure: Lumbar Four-Five Anterior  lumbar interbody fusion;  Surgeon: Barnett Abu, MD;  Location[facility] OR;  Service: Neurosurgery;  Laterality: N/A;  anterior  ? CARPAL TUNNEL RELEASE  05/21/12  ? right wrist  ? COLPOSCOPY    ? CIN I  ? HIP  SURGERY Left 03-06-13  ? --[facility] [place] from injury  ? LASIK    ? MOLE REMOVAL  2000  ? off back - benign  ? ? ?Current Outpatient Medications  ?Medication Sig Dispense Refill  ? amitriptyline (ELAVIL) 10 MG tablet Take 10 mg by mouth at bedtime.   3  ? BLACK ELDERBERRY PO Take 1 tablet by mouth daily.    ? docusate sodium (COLACE) 100 MG capsule     ? esomeprazole (NEXIUM) 20 MG capsule Take 20 mg by mouth daily at 12 noon.    ? GELATIN PO Take by mouth.    ? Magnesium 500 MG CAPS Take 1 capsule by mouth daily.    ? MELATONIN PO Take by mouth.    ? methocarbamol (ROBAXIN) 500 MG tablet Take 1 tablet (500 mg total) by mouth every 6 (six) hours as needed for muscle spasms. 60 tablet 1  ? Multiple Vitamins-Minerals (CENTRUM ULTRA WOMENS PO) Take 1 tablet by mouth daily.    ? ondansetron (ZOFRAN-ODT) 4 MG disintegrating tablet Take 1 tablet by mouth as needed. Takes if has migraine    ? oxyCODONE-acetaminophen (PERCOCET/ROXICET) 5-325 MG tablet Take 1 tablet by mouth every 4 (four) hours as needed for severe pain.    ?  Probiotic Product (PROBIOTIC-10 PO) Take by mouth.    ? rosuvastatin (CRESTOR) 10 MG tablet rosuvastatin 10 mg tablet ? TAKE 1 TABLET BY MOUTH EVERY DAY FOR 90 DAYS    ? vitamin B-12 (CYANOCOBALAMIN) 500 MCG tablet Take 500 mcg by mouth daily.    ? calcium carbonate (OS-CAL) 600 MG TABS tablet Take 600 mg by mouth 2 (two) times daily with a meal. (Patient not taking: Reported on 08/19/2021)    ? doxycycline (VIBRA-TABS) 100 MG tablet Take 100 mg by mouth 2 (two) times daily as needed (irritation).  (Patient not taking: Reported on 08/19/2021)  3  ? Isometheptene-Dichloral-APAP (MIDRIN PO) Take 1 tablet by mouth as needed. (Patient not taking: Reported on 08/19/2021)    ? ?No current facility-administered medications for this visit.  ? ? ?Family History  ?Problem Relation Age of Onset  ? Diabetes Mother   ? Alzheimer's disease Mother   ? Hypertension Father   ? Diabetes Father   ? Heart disease  Father   ? Heart failure Father   ? Sleep apnea Father   ? Cancer Brother   ?     Peritoneal cancer  ? Hypertension Brother   ? Colon cancer Brother 51  ?     Colectomy with Appendectomy  ? Sleep apnea Brother   ? Diabetes Brother   ? Alcohol abuse Brother   ? Alzheimer's disease Maternal Aunt   ? Alzheimer's disease Maternal Grandmother   ? ? ?Review of Systems  ?All other systems reviewed and are negative. ? ?Exam:   ?BP 126/84 (BP Location: Right Arm, Patient Position: Sitting, Cuff Size: Large)   Pulse 82   Ht 5\' 4"  (1.626 m)   Wt 232 lb (105.2 kg)   LMP 08/14/2020 (Exact Date) Comment: No periods in a year  SpO2 98%   BMI 39.82 kg/m?     ?General appearance: alert, cooperative and appears stated age ?Head: normocephalic, without obvious abnormality, atraumatic ?Neck: no adenopathy, supple, symmetrical, trachea midline and thyroid normal to inspection and palpation ?Lungs: clear to auscultation bilaterally ?Breasts: normal appearance, no masses or tenderness, No nipple retraction or dimpling, No nipple discharge or bleeding, No axillary adenopathy ?Heart: regular rate and rhythm ?Abdomen: soft, non-tender; no masses, no organomegaly ?Extremities: extremities normal, atraumatic, no cyanosis or edema ?Skin: skin color, texture, turgor normal. No rashes or lesions ?Lymph nodes: cervical, supraclavicular, and axillary nodes normal. ?Neurologic: grossly normal ? ?Pelvic: External genitalia:  no lesions ?             No abnormal inguinal nodes palpated. ?             Urethra:  normal appearing urethra with no masses, tenderness or lesions ?             Bartholins and Skenes: normal    ?             Vagina: normal appearing vagina with normal color and discharge, no lesions ?             Cervix: no lesions ?             Pap taken: no ?Bimanual Exam:  Uterus:  normal size, contour, position, consistency, mobility, non-tender ?             Adnexa: no mass, fullness, tenderness ?             Rectal exam: yes.   Confirms. ?  Anus:  normal sphincter tone, no lesions ? ?Chaperone was present for exam:  Malen GauzeKim G, CMA ? ?Assessment:   ?Well woman visit with gynecologic exam. ?Remote hx CIN I.  ?Dysmenorrhea controlled with POP.  ?Current amenorrhea. ?Migraine with aura.  ?Hx low vit D.  ?Bereavement.  ?Hidradenitis.   ?Back pain and right hip pain.  ?FH colon cancer - brother.  ? ?Plan: ?Mammogram screening discussed. ?Self breast awareness reviewed. ?Pap and HR HPV as above. ?Guidelines for Calcium, Vitamin D, regular exercise program including cardiovascular and weight bearing exercise. ?Stop Micronor [facility] ?Support given for the loss of her brother.  I did mention grief counseling through [facility].  ?She will do the rest of her routine labs with her PCP. ?Follow up annually and prn.  ?  ?After visit summary provided.  ? ? ?  ?

## 2021-08-19 ENCOUNTER — Ambulatory Visit (INDEPENDENT_AMBULATORY_CARE_PROVIDER_SITE_OTHER): Payer: No Typology Code available for payment source | Admitting: Obstetrics and Gynecology

## 2021-08-19 ENCOUNTER — Encounter: Payer: Self-pay | Admitting: Obstetrics and Gynecology

## 2021-08-19 VITALS — BP 126/84 | HR 82 | Ht 64.0 in | Wt 232.0 lb

## 2021-08-19 DIAGNOSIS — N912 Amenorrhea, unspecified: Secondary | ICD-10-CM

## 2021-08-19 DIAGNOSIS — Z01419 Encounter for gynecological examination (general) (routine) without abnormal findings: Secondary | ICD-10-CM

## 2021-08-19 NOTE — Patient Instructions (Signed)

## 2021-09-02 ENCOUNTER — Other Ambulatory Visit: Payer: No Typology Code available for payment source

## 2021-09-02 DIAGNOSIS — N912 Amenorrhea, unspecified: Secondary | ICD-10-CM

## 2021-09-03 ENCOUNTER — Encounter: Payer: Self-pay | Admitting: Obstetrics and Gynecology

## 2021-09-03 DIAGNOSIS — S73199A Other sprain of unspecified hip, initial encounter: Secondary | ICD-10-CM | POA: Insufficient documentation

## 2021-09-03 LAB — ESTRADIOL: Estradiol: 21 pg/mL

## 2021-09-03 LAB — FOLLICLE STIMULATING HORMONE: FSH: 67.7 m[IU]/mL

## 2021-10-28 HISTORY — PX: OTHER SURGICAL HISTORY: SHX169

## 2021-11-02 ENCOUNTER — Ambulatory Visit: Payer: No Typology Code available for payment source | Attending: Orthopedic Surgery | Admitting: Physical Therapy

## 2021-11-02 DIAGNOSIS — R262 Difficulty in walking, not elsewhere classified: Secondary | ICD-10-CM | POA: Diagnosis present

## 2021-11-02 DIAGNOSIS — M6281 Muscle weakness (generalized): Secondary | ICD-10-CM | POA: Diagnosis present

## 2021-11-02 DIAGNOSIS — M25551 Pain in right hip: Secondary | ICD-10-CM | POA: Insufficient documentation

## 2021-11-02 NOTE — Therapy (Signed)
OUTPATIENT PHYSICAL THERAPY LOWER EXTREMITY EVALUATION   Patient Name: Kelsey [name] MRN: [id number] DOB:February 11, 1966, 56 y.o., female Today's Date: 11/02/2021   PT End of Session - 11/02/21 1102     Visit Number 1    Date for PT Re-Evaluation 01/25/22    Authorization Type [employer] 40 visits    PT Start Time 1102    PT Stop Time 1145    PT Time Calculation (min) 43 min    Activity Tolerance Patient tolerated treatment well             Past Medical History:  Diagnosis Date   Abnormal Pap smear    Bulging lumbar disc    L4, l5--scheduled for surgery 05-04-18--Dr. Elsner   Hidradenitis    Hyperlipidemia    Infertility, female    no method of birth from late 20's through the 40's,  Headaches on OCP   Migraine    with aura   Sleep apnea 2021   Past Surgical History:  Procedure Laterality Date   ABDOMINAL EXPOSURE N/A 05/04/2018   Procedure: ABDOMINAL EXPOSURE;  Surgeon: Cephus Shelling, MD;  Location: [facility] OR;  Service: Vascular;  Laterality: N/A;   ANKLE SURGERY  1998/1999   left ankle fracture with ORIF   ANTERIOR LUMBAR FUSION N/A 05/04/2018   Procedure: Lumbar Four-Five Anterior  lumbar interbody fusion;  Surgeon: Barnett Abu, MD;  Location: [facility] OR;  Service: Neurosurgery;  Laterality: N/A;  anterior   CARPAL TUNNEL RELEASE  05/21/12   right wrist   COLPOSCOPY     CIN I   HIP SURGERY Left 03-06-13   --[facility] - Torn Labrum from injury   LASIK     MOLE REMOVAL  2000   off back - benign   Patient Active Problem List   Diagnosis Date Noted   Spondylolisthesis of lumbar region 05/04/2018   Degenerative disc disease, lumbar 04/17/2018   History of migraine headaches 01/28/2013    PCP: Dr.  Gweneth Dimitri   REFERRING PROVIDER: Dr. Caswell Corwin  REFERRING DIAG: right hip pain M25.551  THERAPY DIAG: right hip pain; weakness  Rationale for Evaluation and Treatment Rehabilitation  ONSET DATE: since the week after Christmas SUBJECTIVE:   SUBJECTIVE  STATEMENT: No known injury for right hip.  Around Christmas noted worsening of bending over, sitting.  Had hip arthroscopy with labral repair on 6/15. Presents on crutches, hip brace; has CPM at home  PERTINENT HISTORY:  Right hip surgery October 28, 2021  see protocol  next MD follow up 7/18  OPERATION PERFORMED: 1. Right hip arthroscopy (69678) [9.00]. 2. Synovectomy of central compartment (93810) [11.17]. 3. Synovectomy of peripheral compartment (17510) [11.17]. 4. Acetabuloplasty (25852) [15.00]. 5. Labral repair (77824) [15.00]. 6. Chondroplasty of acetabulum (23536) [11.17]. 7. Chondroplasty of femoral head (14431) [11.17]. 8. Femoroplasty (54008) [14.67]. 9. Debridement of ligamentum teres (67619) [17.37].    2019 back surgery Elsner (anterior approach) 9 years ago left hip arthroscopy;    PAIN:  Are you having pain? Yes NPRS scale: 2/10 Pain location: right hip  Aggravating factors: moving around Relieving factors: taking pain medication as needed  PRECAUTIONS: Other:  precautions per protocol:   20 pounds foot flat partial weight bearing x 4 weeks Wean from crutches in brace weeks 3-4 Hip brace 0-90 degrees x 10 days then full ROM Brace wear 1-6 weeks as indicated by muscle control and comfort ROM limits: flexion 90 degrees x10 days, extension gentle x3 weeks; external rotation gentle x3 weeks; Internal rotation no  limits; adduction <10 x6 weeks No DN   WEIGHT BEARING RESTRICTIONS Yes 20 pound weight limit flat footed   FALLS:  Has patient fallen in last 6 months? 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)  PLOF: Independent with household mobility with device  PATIENT GOALS be able to bend and sit and walk without pain;  be able to travel for work   OBJECTIVE:    DIAGNOSTIC FINDINGS: labral tear  PATIENT SURVEYS:  FOTO 35% baseline    LOWER EXTREMITY ROM: limited per surgical precautions  passive ROM Right eval Left eval  Hip flexion 80 WFLs  Hip extension 0 WFLs  Hip abduction 5 WFLs  Hip adduction  WFLs  Hip internal rotation 10 WFLs  Hip external rotation  WFLs  Knee flexion    Knee extension    Ankle dorsiflexion    Ankle plantarflexion    Ankle inversion    Ankle eversion     (Blank rows = not tested)  LOWER EXTREMITY MMT:  not tested secondary to surgical precautions; able to do quad set, glute set and abdominal brace   GAIT: Distance walked: 75 feet Assistive device utilized: Crutches Level of assistance: Modified independence Comments: brace on; slow pattern    TODAY'S TREATMENT: Review of protocol and current HEP: quad sets, glute sets, passive internal rotation at 0 degrees and close to 90 degrees   PATIENT EDUCATION:  Education details: current HEP Person educated: Patient Education method: Medical illustrator Education comprehension: verbalized understanding and returned demonstration   HOME EXERCISE PROGRAM: As discussed  ASSESSMENT:  CLINICAL IMPRESSION: Patient is a 56 y.o. female who was seen today for physical therapy evaluation and treatment for right hip pain s/p right arthroscopy for labral repair on 10/28/21 (5 days ago).  Will follow surgical protocol provided by Dr. Davene Costain office.     OBJECTIVE IMPAIRMENTS Abnormal gait, decreased activity tolerance, decreased mobility, difficulty walking, decreased ROM, decreased strength, impaired perceived functional ability, and pain.   ACTIVITY LIMITATIONS lifting, bending, sitting, standing, squatting, sleeping, stairs, and locomotion level  PARTICIPATION LIMITATIONS: meal prep, cleaning, laundry, driving, shopping, community activity, and occupation  PERSONAL FACTORS 1 comorbidity: previous back surgery and left hip surgery  are also  affecting patient's functional outcome.   REHAB POTENTIAL: Good  CLINICAL DECISION MAKING: Stable/uncomplicated  EVALUATION COMPLEXITY: Low   GOALS: Goals reviewed with patient? Yes  SHORT TERM GOALS: Target date: 12/14/2021 The patient will demonstrate knowledge of basic self care strategies and exercises to promote healing   Baseline: Goal status: INITIAL  2.  The patient will report a 50% improvement in pain levels with functional activities which are currently difficult including bending over, sitting for work Baseline:  Goal status: INITIAL  3.  The patient will have full hip ROM needed for basic ADLs  Baseline:  Goal status: INITIAL  4.  The patient will be able to full weight bear and ambulate household and short community distances with ease Baseline:  Goal status: INITIAL   LONG TERM GOALS: Target date: 01/25/2022  The patient will be independent in a safe self progression of a home exercise program to promote further recovery of function   Baseline:  Goal status: INITIAL  2.  The patient will report a 75% improvement in pain levels with functional activities which are currently difficult  including  Baseline:  Goal status: INITIAL  3.  Hip flexion, abduction, adduction and rotation strength grossly 4/5 or within 75% of noninvolved side Baseline:  Goal status: INITIAL  4.  The patient will have returned to the majority of home and work ADLs including traveling for work Baseline:  Goal status: INITIAL  5.  FOTO score improved to 56%  Baseline:  Goal status: INITIAL     PLAN: PT FREQUENCY: 2-3x/week  PT DURATION: 12 weeks  PLANNED INTERVENTIONS: Therapeutic exercises, Therapeutic activity, Neuromuscular re-education, Patient/Family education, Joint mobilization, Aquatic Therapy,  Electrical stimulation, Cryotherapy, Taping, Ultrasound, Ionotophoresis 4mg /ml Dexamethasone, and Manual therapy  PLAN FOR NEXT SESSION: per protocol: 6/22: prone piriformis  stretch, heel slides, hip abd/add isometrics, prone resisted internal and external rotation  7/22, PT 11/02/21 5:28 PM Phone: [phone] Fax: [phone]

## 2021-11-04 ENCOUNTER — Ambulatory Visit: Payer: No Typology Code available for payment source | Admitting: Physical Therapy

## 2021-11-04 DIAGNOSIS — M6281 Muscle weakness (generalized): Secondary | ICD-10-CM

## 2021-11-04 DIAGNOSIS — M25551 Pain in right hip: Secondary | ICD-10-CM

## 2021-11-04 DIAGNOSIS — R262 Difficulty in walking, not elsewhere classified: Secondary | ICD-10-CM

## 2021-11-04 NOTE — Therapy (Signed)
OUTPATIENT PHYSICAL THERAPY TREATMENT NOTE   Patient Name: Kelsey [name] MRN: [id number] DOB:July 05, 1965, 56 y.o., female Today's Date: 11/04/2021   REFERRING PROVIDER: Dr. Caswell Corwin  END OF SESSION:   PT End of Session - 11/04/21 1140     Visit Number 2    Date for PT Re-Evaluation 01/25/22    Authorization Type [employer] 40 visits    PT Start Time 1142    PT Stop Time 1225    PT Time Calculation (min) 43 min    Activity Tolerance Patient tolerated treatment well             Past Medical History:  Diagnosis Date   Abnormal Pap smear    Bulging lumbar disc    L4, l5--scheduled for surgery 05-04-18--Dr. Elsner   Hidradenitis    Hyperlipidemia    Infertility, female    no method of birth from late 20's through the 40's,  Headaches on OCP   Migraine    with aura   Sleep apnea 2021   Past Surgical History:  Procedure Laterality Date   ABDOMINAL EXPOSURE N/A 05/04/2018   Procedure: ABDOMINAL EXPOSURE;  Surgeon: Cephus Shelling, MD;  Location: [facility] OR;  Service: Vascular;  Laterality: N/A;   ANKLE SURGERY  1998/1999   left ankle fracture with ORIF   ANTERIOR LUMBAR FUSION N/A 05/04/2018   Procedure: Lumbar Four-Five Anterior  lumbar interbody fusion;  Surgeon: Barnett Abu, MD;  Location: [facility] OR;  Service: Neurosurgery;  Laterality: N/A;  anterior   CARPAL TUNNEL RELEASE  05/21/12   right wrist   COLPOSCOPY     CIN I   HIP SURGERY Left 03-06-13   --[facility] - Torn Labrum from injury   LASIK     MOLE REMOVAL  2000   off back - benign   Patient Active Problem List   Diagnosis Date Noted   Spondylolisthesis of lumbar region 05/04/2018   Degenerative disc disease, lumbar 04/17/2018   History of migraine headaches 01/28/2013    REFERRING DIAG: right hip pain M25.551  THERAPY DIAG:  right hip pain; weakness     Rationale for Evaluation and Treatment Rehabilitation  PERTINENT HISTORY:   Right hip arthroscopic surgery October 28, 2021  see protocol  next MD follow  up 7/18   OPERATION PERFORMED: 1. Right hip arthroscopy (40981) [9.00]. 2. Synovectomy of central compartment (19147) [11.17]. 3. Synovectomy of peripheral compartment (82956) [11.17]. 4. Acetabuloplasty (21308) [15.00]. 5. Labral repair (65784) [15.00]. 6. Chondroplasty of acetabulum (69629) [11.17]. 7. Chondroplasty of femoral head (52841) [11.17]. 8. Femoroplasty (32440) [14.67]. 9. Debridement of ligamentum teres (10272) [17.37].    PRECAUTIONS:  precautions per protocol:   20 pounds foot flat partial weight bearing x 4 weeks Wean from crutches in brace weeks 3-4 Hip brace 0-90 degrees x 10 days then full ROM Brace wear 1-6 weeks as indicated by muscle control and comfort ROM limits: flexion 90 degrees x10 days, extension gentle x3 weeks; external rotation gentle x3 weeks; Internal rotation no limits; adduction <10 x6 weeks No DN  SUBJECTIVE:  It's waking up but that whole front is still numb.  Minimal pain this week.  Using crutches, brace on.  The patient notes a discrepancy in length of time for use of CPM, brace and weight bearing restrictions.  She called the office and was told dates listed in protocol were accurate.    PAIN:  PAIN:  Are you having pain? Yes NPRS scale: 2/10 Pain location: right groin  OBJECTIVE: (objective  measures completed at initial evaluation unless otherwise dated)  DIAGNOSTIC FINDINGS: labral tear   PATIENT SURVEYS:  FOTO 35% baseline       LOWER EXTREMITY ROM: limited per surgical precautions   passive ROM Right eval Left eval  Hip flexion 80 WFLs  Hip extension 0 WFLs  Hip abduction 5 WFLs  Hip adduction   WFLs  Hip internal rotation 10 WFLs  Hip external rotation   WFLs  Knee flexion      Knee extension      Ankle dorsiflexion      Ankle plantarflexion      Ankle inversion      Ankle eversion       (Blank rows = not tested)   LOWER EXTREMITY MMT:  not tested secondary to surgical precautions; able to do quad set, glute set  and abdominal brace     GAIT: Distance walked: 75 feet Assistive device utilized: Crutches Level of assistance: Modified independence Comments: brace on; slow pattern       TODAY'S TREATMENT: 6/22: Used large mat in cancer rehab Needs assist to move right LE on/off table and with turning over Ex performed without brace  Ab brace supine and seated 5 sec holds 5x each Glute set 8x Quad set 8x Passive hip internal rotation at 90 degrees 20x Passive hip roll 20x Heel slides 15x Hip abduction and adduction isometrics 5 sec 10x each Prone resisted internal and external rotation slightly bent knee 5 sec hold 10x each Prone piriformis stretch passive external rotation 10x Prone lying 3 min   Eval day: Review of protocol and current HEP: quad sets, glute sets, passive internal rotation at 0 degrees and close to 90 degrees     PATIENT EDUCATION:  Education details: current HEP Person educated: Patient Education method: Medical illustrator Education comprehension: verbalized understanding and returned demonstration     HOME EXERCISE PROGRAM: Access Code: [facility] URL: [email or web address] Date: 11/04/2021 Prepared by: Lavinia Sharps  Exercises - Supine Heel Slide  - 1 x daily - 7 x weekly - 2 sets - 10 reps - Supine Hip Abduction  - 1 x daily - 7 x weekly - 1 sets - 5-10 reps - Supine Isometric Hip Adduction with Pillow at Knees  - 1 x daily - 7 x weekly - 1 sets - 5-10 reps - Prone Heel Squeeze  - 1 x daily - 7 x weekly - 1 sets - 5-10 reps As discussed   ASSESSMENT:   CLINICAL IMPRESSION: On track with surgical protocol.  Pt expresses how amazed she is the movements and positions are painfree.   States she's been unable to lie prone for quite a while and feels good to do that.       OBJECTIVE IMPAIRMENTS Abnormal gait, decreased activity tolerance, decreased mobility, difficulty walking, decreased ROM, decreased strength, impaired perceived  functional ability, and pain.    ACTIVITY LIMITATIONS lifting, bending, sitting, standing, squatting, sleeping, stairs, and locomotion level   PARTICIPATION LIMITATIONS: meal prep, cleaning, laundry, driving, shopping, community activity, and occupation   PERSONAL FACTORS 1 comorbidity: previous back surgery and left hip surgery  are also affecting patient's functional outcome.    REHAB POTENTIAL: Good   CLINICAL DECISION MAKING: Stable/uncomplicated   EVALUATION COMPLEXITY: Low     GOALS: Goals reviewed with patient? Yes   SHORT TERM GOALS: Target date: 12/14/2021 The patient will demonstrate knowledge of basic self care strategies and exercises to promote healing   Baseline: Goal status: INITIAL  2.  The patient will report a 50% improvement in pain levels with functional activities which are currently difficult including bending over, sitting for work Baseline:  Goal status: INITIAL   3.  The patient will have full hip ROM needed for basic ADLs  Baseline:  Goal status: INITIAL   4.  The patient will be able to full weight bear and ambulate household and short community distances with ease Baseline:  Goal status: INITIAL     LONG TERM GOALS: Target date: 01/25/2022   The patient will be independent in a safe self progression of a home exercise program to promote further recovery of function   Baseline:  Goal status: INITIAL   2.  The patient will report a 75% improvement in pain levels with functional activities which are currently difficult including  Baseline:  Goal status: INITIAL   3.  Hip flexion, abduction, adduction and rotation strength grossly 4/5 or within 75% of noninvolved side Baseline:  Goal status: INITIAL   4.  The patient will have returned to the majority of home and work ADLs including traveling for work Baseline:  Goal status: INITIAL   5.  FOTO score improved to 56%  Baseline:  Goal status: INITIAL         PLAN: PT FREQUENCY:  2-3x/week   PT DURATION: 12 weeks   PLANNED INTERVENTIONS: Therapeutic exercises, Therapeutic activity, Neuromuscular re-education, Patient/Family education, Joint mobilization, Aquatic Therapy,  Electrical stimulation, Cryotherapy, Taping, Ultrasound, Ionotophoresis 4mg /ml Dexamethasone, and Manual therapy   PLAN FOR NEXT SESSION: per protocol date of surgery 6/15:  prone piriformis stretch, heel slides, hip abd/add isometrics, prone resisted internal and external rotation   7/15, PT 11/04/21 12:30 PM Phone: [phone] Fax: [phone]

## 2021-11-08 ENCOUNTER — Ambulatory Visit: Payer: No Typology Code available for payment source

## 2021-11-08 DIAGNOSIS — M25551 Pain in right hip: Secondary | ICD-10-CM

## 2021-11-08 DIAGNOSIS — R262 Difficulty in walking, not elsewhere classified: Secondary | ICD-10-CM

## 2021-11-08 DIAGNOSIS — M6281 Muscle weakness (generalized): Secondary | ICD-10-CM

## 2021-11-09 ENCOUNTER — Ambulatory Visit: Payer: No Typology Code available for payment source

## 2021-11-09 DIAGNOSIS — M25551 Pain in right hip: Secondary | ICD-10-CM | POA: Diagnosis not present

## 2021-11-09 DIAGNOSIS — R262 Difficulty in walking, not elsewhere classified: Secondary | ICD-10-CM

## 2021-11-09 DIAGNOSIS — M6281 Muscle weakness (generalized): Secondary | ICD-10-CM

## 2021-11-10 ENCOUNTER — Ambulatory Visit: Payer: No Typology Code available for payment source | Admitting: Physical Therapy

## 2021-11-12 ENCOUNTER — Ambulatory Visit: Payer: No Typology Code available for payment source | Admitting: Physical Therapy

## 2021-11-12 DIAGNOSIS — M25551 Pain in right hip: Secondary | ICD-10-CM | POA: Diagnosis not present

## 2021-11-12 DIAGNOSIS — M6281 Muscle weakness (generalized): Secondary | ICD-10-CM

## 2021-11-12 DIAGNOSIS — R262 Difficulty in walking, not elsewhere classified: Secondary | ICD-10-CM

## 2021-11-12 NOTE — Therapy (Signed)
OUTPATIENT PHYSICAL THERAPY TREATMENT NOTE   Patient Name: Kelsey [name] MRN: [id number] DOB:02-21-1966, 56 y.o., female Today's Date: 11/12/2021   REFERRING PROVIDER: Dr. Aretha Parrot  END OF SESSION:   PT End of Session - 11/12/21 1105     Visit Number 5    Date for PT Re-Evaluation 01/25/22    Authorization Type [employer] 40 visits    Authorization - Visit Number 5    Authorization - Number of Visits 40    PT Start Time 1102    PT Stop Time 1145    PT Time Calculation (min) 43 min    Activity Tolerance Patient tolerated treatment well               Past Medical History:  Diagnosis Date   Abnormal Pap smear    Bulging lumbar disc    L4, l5--scheduled for surgery 05-04-18--Dr. Elsner   Hidradenitis    Hyperlipidemia    Infertility, female    no method of birth from late 20's through the 40's,  Headaches on OCP   Migraine    with aura   Sleep apnea 2021   Past Surgical History:  Procedure Laterality Date   ABDOMINAL EXPOSURE N/A 05/04/2018   Procedure: ABDOMINAL EXPOSURE;  Surgeon: Marty Heck, MD;  Location: [facility];  Service: Vascular;  Laterality: N/A;   ANKLE SURGERY  1998/1999   left ankle fracture with ORIF   ANTERIOR LUMBAR FUSION N/A 05/04/2018   Procedure: Lumbar Four-Five Anterior  lumbar interbody fusion;  Surgeon: Kristeen Miss, MD;  Location: [facility];  Service: Neurosurgery;  Laterality: N/A;  anterior   CARPAL TUNNEL RELEASE  05/21/12   right wrist   COLPOSCOPY     CIN I   HIP SURGERY Left 03-06-13   --[place] from injury   [place]  2000   off back - benign   Patient Active Problem List   Diagnosis Date Noted   Spondylolisthesis of lumbar region 05/04/2018   Degenerative disc disease, lumbar 04/17/2018   History of migraine headaches 01/28/2013    REFERRING DIAG: right hip pain M25.551  THERAPY DIAG:  right hip pain; weakness     Rationale for Evaluation and Treatment Rehabilitation  PERTINENT  HISTORY:   Right hip arthroscopic surgery October 28, 2021  see protocol  next MD follow up 7/18   OPERATION PERFORMED: 1. Right hip arthroscopy (79150) [9.00]. 2. Synovectomy of central compartment (41364) [11.17]. 3. Synovectomy of peripheral compartment (38377) [11.17]. 4. Acetabuloplasty (93968) [15.00]. 5. Labral repair (86484) [15.00]. 6. Chondroplasty of acetabulum (72072) [11.17]. 7. Chondroplasty of femoral head (18288) [11.17]. 8. Femoroplasty (33744) [14.67]. 9. Debridement of ligamentum teres (51460) [17.37].    PRECAUTIONS:  precautions per protocol:   20 pounds foot flat partial weight bearing x 4 weeks (specific patient instructions say 2 weeks) Wean from crutches in brace weeks 3-4 Hip brace 0-90 degrees x 10 days then full ROM Brace wear 1-6 weeks as indicated by muscle control and comfort ROM limits: flexion 90 degrees x10 days, extension gentle x3 weeks; external rotation gentle x3 weeks; Internal rotation no limits; adduction <10 x6 weeks No DN  SUBJECTIVE:  Increasing weightbearing so it's a little irritated PAIN:  PAIN:  Are you having pain? Yes NPRS scale: 3-4/10 Pain location: right groin  OBJECTIVE: (objective measures completed at initial evaluation unless otherwise dated)  DIAGNOSTIC FINDINGS: labral tear   PATIENT SURVEYS:  FOTO 35% baseline  LOWER EXTREMITY ROM: limited per surgical precautions   passive ROM Right eval Left eval  Hip flexion 80 WFLs  Hip extension 0 WFLs  Hip abduction 5 WFLs  Hip adduction   WFLs  Hip internal rotation 10 WFLs  Hip external rotation   WFLs  Knee flexion      Knee extension      Ankle dorsiflexion      Ankle plantarflexion      Ankle inversion      Ankle eversion       (Blank rows = not tested)   LOWER EXTREMITY MMT:  not tested secondary to surgical precautions; able to do quad set, glute set and abdominal brace     GAIT: Distance walked: 75 feet Assistive device utilized: Crutches Level  of assistance: Modified independence Comments: brace on; slow pattern       TODAY'S TREATMENT: 6/30: Ex performed without brace  Seated: press into foam roll for TA activation 5" hold x15 Glute set 8x Quad set 5" hold x 20 with tactile cueing Passive hip internal rotation at 90 degrees 20x Supine assisted fig 4 with therapist supporting at knee hold 20 sec 3x Right KTC 3x 20 sec holds Left KTC with right leg straight (for easy right hip flexor stretch) 3x Right leg slight heel dangle off edge of mat for easy hip flexor stretch 3x Heel slides 15x Hip adduction: isometric using a ball: 5" hold x 20, abduction with manual resistance by PT 5" hold x20 - supine  Prone resisted internal and external rotation slightly bent knee 5 sec hold 10x each Prone piriformis stretch passive external rotation 10x Seated assisted fig 4 stretch with therapist supporting knee hold 20 sec 3x Arm bike: level 1x 6 minutes (3/3)-for endurance       11/09/21:  Ex performed without brace  Seated: press into foam roll for TA activation 5" hold x15 Glute set 8x Quad set 5" hold x 20 with tactile cueing Passive hip internal rotation at 90 degrees 20x Passive hip roll 20x Heel slides 15x Hip adduction: isometric using a ball: 5" hold x 20, abduction with manual resistance by PT 5" hold x20 - supine  Prone resisted internal and external rotation slightly bent knee 5 sec hold 10x each Prone piriformis stretch passive external rotation 10x Arm bike: level 1x 6 minutes (3/3)-for endurance  11/08/21: Used large mat in cancer rehab Needs assist to move right LE on/off table and with turning over Ex performed without brace  Ab brace supine and seated 5 sec holds 5x each Glute set 8x Quad set 5" hold x 20 with tactile cueing Passive hip internal rotation at 90 degrees 20x Passive hip roll 20x Heel slides 15x Hip adduction: isometric using a ball: 5" hold x 20, abduction with manual resistance by PT 5" hold  x20 - supine  Prone resisted internal and external rotation slightly bent knee 5 sec hold 10x each Prone piriformis stretch passive external rotation 10x     PATIENT EDUCATION:  Education details: current HEP Person educated: Patient Education method: Customer service manager Education comprehension: verbalized understanding and returned demonstration     HOME EXERCISE PROGRAM: Access Code: [email or web address] URL: [email or web address] Date: 11/04/2021 Prepared by: Ruben Im  Exercises - Supine Heel Slide  - 1 x daily - 7 x weekly - 2 sets - 10 reps - Supine Hip Abduction  - 1 x daily - 7 x weekly - 1 sets - 5-10 reps - Supine Isometric Hip Adduction  with Pillow at Knees  - 1 x daily - 7 x weekly - 1 sets - 5-10 reps - Prone Heel Squeeze  - 1 x daily - 7 x weekly - 1 sets - 5-10 reps As discussed   ASSESSMENT:   CLINICAL IMPRESSION: Patient able to move leg on/off table and roll supine to prone without assist now.  Introduced gentle hip flexor stretching and fig4 stretching per protocol.  She reports no increase in pain level overall but "I know I've stretched it."   On track with surgical ROM and strength expectations.   OBJECTIVE IMPAIRMENTS Abnormal gait, decreased activity tolerance, decreased mobility, difficulty walking, decreased ROM, decreased strength, impaired perceived functional ability, and pain.    ACTIVITY LIMITATIONS lifting, bending, sitting, standing, squatting, sleeping, stairs, and locomotion level   PARTICIPATION LIMITATIONS: meal prep, cleaning, laundry, driving, shopping, community activity, and occupation   PERSONAL FACTORS 1 comorbidity: previous back surgery and left hip surgery  are also affecting patient's functional outcome.    REHAB POTENTIAL: Good   CLINICAL DECISION MAKING: Stable/uncomplicated   EVALUATION COMPLEXITY: Low     GOALS: Goals reviewed with patient? Yes   SHORT TERM GOALS: Target date: 12/14/2021 The patient  will demonstrate knowledge of basic self care strategies and exercises to promote healing   Baseline: Goal status: Met (11/09/21)   2.  The patient will report a 50% improvement in pain levels with functional activities which are currently difficult including bending over, sitting for work Baseline:  Goal status: in progress   3.  The patient will have full hip ROM needed for basic ADLs  Baseline:  Goal status: in progress  4.  The patient will be able to full weight bear and ambulate household and short community distances with ease Baseline:  Goal status: in progress    LONG TERM GOALS: Target date: 01/25/2022   The patient will be independent in a safe self progression of a home exercise program to promote further recovery of function   Baseline:  Goal status: INITIAL   2.  The patient will report a 75% improvement in pain levels with functional activities which are currently difficult including  Baseline:  Goal status: INITIAL   3.  Hip flexion, abduction, adduction and rotation strength grossly 4/5 or within 75% of noninvolved side Baseline:  Goal status: INITIAL   4.  The patient will have returned to the majority of home and work ADLs including traveling for work Baseline:  Goal status: INITIAL   5.  FOTO score improved to 56%  Baseline:  Goal status: INITIAL         PLAN: PT FREQUENCY: 2-3x/week   PT DURATION: 12 weeks   PLANNED INTERVENTIONS: Therapeutic exercises, Therapeutic activity, Neuromuscular re-education, Patient/Family education, Joint mobilization, Aquatic Therapy,  Electrical stimulation, Cryotherapy, Taping, Ultrasound, Ionotophoresis 91m/ml Dexamethasone, and Manual therapy   PLAN FOR NEXT SESSION: per protocol date of surgery 6/15:fig 4 supine and seated; KTC; supine hip flexor stretch;  prone piriformis stretch, heel slides, hip abd/add isometrics, prone resisted internal and external rotation, work on gait with WSharlyne Cai  PT 11/12/21 11:45 AM Phone: [phone]: [phone] [address] [place] [address]Phone # [phone] [phone]

## 2021-11-17 ENCOUNTER — Ambulatory Visit: Payer: No Typology Code available for payment source | Attending: Orthopedic Surgery

## 2021-11-17 DIAGNOSIS — M6281 Muscle weakness (generalized): Secondary | ICD-10-CM

## 2021-11-17 DIAGNOSIS — R262 Difficulty in walking, not elsewhere classified: Secondary | ICD-10-CM

## 2021-11-17 DIAGNOSIS — M25551 Pain in right hip: Secondary | ICD-10-CM

## 2021-11-17 NOTE — Therapy (Signed)
OUTPATIENT PHYSICAL THERAPY TREATMENT NOTE   Patient Name: Kelsey [name] MRN: [id number] DOB:Feb 20, 1966, 56 y.o., female Today's Date: 11/17/2021   REFERRING PROVIDER: Dr. Aretha Parrot  END OF SESSION:       Past Medical History:  Diagnosis Date   Abnormal Pap smear    Bulging lumbar disc    L4, l5--scheduled for surgery 05-04-18--Dr. Elsner   Hidradenitis    Hyperlipidemia    Infertility, female    no method of birth from late 20's through the 40's,  Headaches on OCP   Migraine    with aura   Sleep apnea 2021   Past Surgical History:  Procedure Laterality Date   ABDOMINAL EXPOSURE N/A 05/04/2018   Procedure: ABDOMINAL EXPOSURE;  Surgeon: Marty Heck, MD;  Location: [facility];  Service: Vascular;  Laterality: N/A;   ANKLE SURGERY  1998/1999   left ankle fracture with ORIF   ANTERIOR LUMBAR FUSION N/A 05/04/2018   Procedure: Lumbar Four-Five Anterior  lumbar interbody fusion;  Surgeon: Kristeen Miss, MD;  Location: [facility];  Service: Neurosurgery;  Laterality: N/A;  anterior   CARPAL TUNNEL RELEASE  05/21/12   right wrist   COLPOSCOPY     CIN I   HIP SURGERY Left 03-06-13   --[facility] from injury   [place]  2000   off back - benign   Patient Active Problem List   Diagnosis Date Noted   Spondylolisthesis of lumbar region 05/04/2018   Degenerative disc disease, lumbar 04/17/2018   History of migraine headaches 01/28/2013    REFERRING DIAG: right hip pain M25.551  THERAPY DIAG:  right hip pain; weakness     Rationale for Evaluation and Treatment Rehabilitation  PERTINENT HISTORY:   Right hip arthroscopic surgery October 28, 2021  see protocol  next MD follow up 7/18   OPERATION PERFORMED: 1. Right hip arthroscopy (34193) [9.00]. 2. Synovectomy of central compartment (79024) [11.17]. 3. Synovectomy of peripheral compartment (09735) [11.17]. 4. Acetabuloplasty (32992) [15.00]. 5. Labral repair (42683) [15.00]. 6. Chondroplasty of  acetabulum (41962) [11.17]. 7. Chondroplasty of femoral head (22979) [11.17]. 8. Femoroplasty (89211) [14.67]. 9. Debridement of ligamentum teres (94174) [17.37].    PRECAUTIONS:  precautions per protocol:   20 pounds foot flat partial weight bearing x 4 weeks (specific patient instructions say 2 weeks) Wean from crutches in brace weeks 3-4 Hip brace 0-90 degrees x 10 days then full ROM Brace wear 1-6 weeks as indicated by muscle control and comfort ROM limits: flexion 90 degrees x10 days, extension gentle x3 weeks; external rotation gentle x3 weeks; Internal rotation no limits; adduction <10 x6 weeks No DN  SUBJECTIVE:  Increasing weightbearing so it's a little irritated PAIN:  PAIN:  Are you having pain? Yes NPRS scale: 3-4/10 Pain location: right groin  OBJECTIVE: (objective measures completed at initial evaluation unless otherwise dated)  DIAGNOSTIC FINDINGS: labral tear   PATIENT SURVEYS:  FOTO 35% baseline       LOWER EXTREMITY ROM: limited per surgical precautions   passive ROM Right eval Left eval  Hip flexion 80 WFLs  Hip extension 0 WFLs  Hip abduction 5 WFLs  Hip adduction   WFLs  Hip internal rotation 10 WFLs  Hip external rotation   WFLs  Knee flexion      Knee extension      Ankle dorsiflexion      Ankle plantarflexion      Ankle inversion      Ankle  eversion       (Blank rows = not tested)   LOWER EXTREMITY MMT:  not tested secondary to surgical precautions; able to do quad set, glute set and abdominal brace     GAIT: Distance walked: 75 feet Assistive device utilized: Crutches Level of assistance: Modified independence Comments: brace on; slow pattern       TODAY'S TREATMENT: 11/17/21: Ex performed without brace  Seated: press into foam roll for TA activation 5" hold x15 Gait with 1 crutch: down the hallway- good pattern symmetry Quad rocking x 10 Passive hip internal rotation at 90 degrees 20x Supine assisted fig 4 with therapist  supporting at knee hold 20 sec 3x Right knee to chest 3x 20 sec holds Left KTC with right leg straight (for easy right hip flexor stretch) 3x Right leg slight heel dangle off edge of mat for easy hip flexor stretch 3x Heel slides 15x Hip adduction: isometric using a ball: 5" hold x 20, abduction with manual resistance by PT 5" hold x20 - supine  Prone resisted internal and external rotation slightly bent knee 5 sec hold 10x each Prone piriformis stretch passive external rotation 10x Seated assisted fig 4 stretch with therapist supporting knee hold 20 sec 3x Arm bike: level 1x 6 minutes (3/3)-for endurance   6/30: Ex performed without brace  Seated: press into foam roll for TA activation 5" hold x15 Glute set 8x Quad set 5" hold x 20 with tactile cueing Passive hip internal rotation at 90 degrees 20x Supine assisted fig 4 with therapist supporting at knee hold 20 sec 3x Right KTC 3x 20 sec holds Left KTC with right leg straight (for easy right hip flexor stretch) 3x Right leg slight heel dangle off edge of mat for easy hip flexor stretch 3x Heel slides 15x Hip adduction: isometric using a ball: 5" hold x 20, abduction with manual resistance by PT 5" hold x20 - supine  Prone resisted internal and external rotation slightly bent knee 5 sec hold 10x each Prone piriformis stretch passive external rotation 10x Seated assisted fig 4 stretch with therapist supporting knee hold 20 sec 3x Arm bike: level 1x 6 minutes (3/3)-for endurance   11/09/21:  Ex performed without brace  Seated: press into foam roll for TA activation 5" hold x15 Glute set 8x Quad set 5" hold x 20 with tactile cueing Passive hip internal rotation at 90 degrees 20x Passive hip roll 20x Heel slides 15x Hip adduction: isometric using a ball: 5" hold x 20, abduction with manual resistance by PT 5" hold x20 - supine  Prone resisted internal and external rotation slightly bent knee 5 sec hold 10x each Prone piriformis  stretch passive external rotation 10x Arm bike: level 1x 6 minutes (3/3)-for endurance    PATIENT EDUCATION:  Education details: current HEP Person educated: Patient Education method: Customer service manager Education comprehension: verbalized understanding and returned demonstration     HOME EXERCISE PROGRAM: Access Code: [email or web address] URL: [email or web address] Date: 11/04/2021 Prepared by: Ruben Im  Exercises - Supine Heel Slide  - 1 x daily - 7 x weekly - 2 sets - 10 reps - Supine Hip Abduction  - 1 x daily - 7 x weekly - 1 sets - 5-10 reps - Supine Isometric Hip Adduction with Pillow at Knees  - 1 x daily - 7 x weekly - 1 sets - 5-10 reps - Prone Heel Squeeze  - 1 x daily - 7 x weekly - 1 sets - 5-10 reps As  discussed   ASSESSMENT:   CLINICAL IMPRESSION: Pt is now WBAT with 1-2 crutches.  Pt demonstrates good symmetry with this and will work on endurance with this at home.  Pt is doing well with post-op protocol.  Pt is able to do hip flexor stretch independently now.  Patient will benefit from skilled PT to address the below impairments and improve overall function.     OBJECTIVE IMPAIRMENTS Abnormal gait, decreased activity tolerance, decreased mobility, difficulty walking, decreased ROM, decreased strength, impaired perceived functional ability, and pain.    ACTIVITY LIMITATIONS lifting, bending, sitting, standing, squatting, sleeping, stairs, and locomotion level   PARTICIPATION LIMITATIONS: meal prep, cleaning, laundry, driving, shopping, community activity, and occupation   PERSONAL FACTORS 1 comorbidity: previous back surgery and left hip surgery  are also affecting patient's functional outcome.    REHAB POTENTIAL: Good   CLINICAL DECISION MAKING: Stable/uncomplicated   EVALUATION COMPLEXITY: Low     GOALS: Goals reviewed with patient? Yes   SHORT TERM GOALS: Target date: 12/14/2021 The patient will demonstrate knowledge of basic self care  strategies and exercises to promote healing   Baseline: Goal status: Met (11/09/21)   2.  The patient will report a 50% improvement in pain levels with functional activities which are currently difficult including bending over, sitting for work Baseline:  Goal status:Met   3.  The patient will have full hip ROM needed for basic ADLs  Baseline:  Goal status: in progress  4.  The patient will be able to full weight bear and ambulate household and short community distances with ease Baseline:  Goal status: in progress    LONG TERM GOALS: Target date: 01/25/2022   The patient will be independent in a safe self progression of a home exercise program to promote further recovery of function   Baseline:  Goal status: INITIAL   2.  The patient will report a 75% improvement in pain levels with functional activities which are currently difficult including  Baseline:  Goal status: INITIAL   3.  Hip flexion, abduction, adduction and rotation strength grossly 4/5 or within 75% of noninvolved side Baseline:  Goal status: INITIAL   4.  The patient will have returned to the majority of home and work ADLs including traveling for work Baseline:  Goal status: INITIAL   5.  FOTO score improved to 56%  Baseline:  Goal status: INITIAL         PLAN: PT FREQUENCY: 2-3x/week   PT DURATION: 12 weeks   PLANNED INTERVENTIONS: Therapeutic exercises, Therapeutic activity, Neuromuscular re-education, Patient/Family education, Joint mobilization, Aquatic Therapy,  Electrical stimulation, Cryotherapy, Taping, Ultrasound, Ionotophoresis 35m/ml Dexamethasone, and Manual therapy   PLAN FOR NEXT SESSION: per protocol date of surgery 6/15:fig 4 supine and seated; KTC; supine hip flexor stretch;  prone piriformis stretch, heel slides, hip abd/add isometrics, prone resisted internal and external rotation, work on gait with WCarmina Miller PT 11/17/21 9:32 AM  [place] [place]ansom Canyon[place]erkimer [place] [address]Phone # [phone] [phone]

## 2021-11-18 ENCOUNTER — Ambulatory Visit: Payer: No Typology Code available for payment source

## 2021-11-18 DIAGNOSIS — M25551 Pain in right hip: Secondary | ICD-10-CM | POA: Diagnosis not present

## 2021-11-18 DIAGNOSIS — R262 Difficulty in walking, not elsewhere classified: Secondary | ICD-10-CM

## 2021-11-18 DIAGNOSIS — M6281 Muscle weakness (generalized): Secondary | ICD-10-CM

## 2021-11-18 NOTE — Therapy (Signed)
OUTPATIENT PHYSICAL THERAPY TREATMENT NOTE   Patient Name: Kelsey [name] MRN: [id number] DOB:1965/11/09, 56 y.o., female Today's Date: 11/18/2021   REFERRING PROVIDER: Dr. Aretha Parrot  END OF SESSION:   PT End of Session - 11/18/21 1003     Visit Number 7    Date for PT Re-Evaluation 01/25/22    Authorization Type [employer] 40 visits    Authorization - Visit Number 7    Authorization - Number of Visits 40    PT Start Time [phone]    PT Stop Time 1006    PT Time Calculation (min) 42 min    Activity Tolerance Patient tolerated treatment well    Behavior During Therapy [facility] for tasks assessed/performed                Past Medical History:  Diagnosis Date   Abnormal Pap smear    Bulging lumbar disc    L4, l5--scheduled for surgery 05-04-18--Dr. Elsner   Hidradenitis    Hyperlipidemia    Infertility, female    no method of birth from late 20's through the 40's,  Headaches on OCP   Migraine    with aura   Sleep apnea 2021   Past Surgical History:  Procedure Laterality Date   ABDOMINAL EXPOSURE N/A 05/04/2018   Procedure: ABDOMINAL EXPOSURE;  Surgeon: Marty Heck, MD;  Location: [place];  Service: Vascular;  Laterality: N/A;   ANKLE SURGERY  1998/1999   left ankle fracture with ORIF   ANTERIOR LUMBAR FUSION N/A 05/04/2018   Procedure: Lumbar Four-Five Anterior  lumbar interbody fusion;  Surgeon: Kristeen Miss, MD;  Location: [place];  Service: Neurosurgery;  Laterality: N/A;  anterior   CARPAL TUNNEL RELEASE  05/21/12   right wrist   COLPOSCOPY     CIN I   HIP SURGERY Left 03-06-13   --[place] from injury   [place]  2000   off back - benign   Patient Active Problem List   Diagnosis Date Noted   Spondylolisthesis of lumbar region 05/04/2018   Degenerative disc disease, lumbar 04/17/2018   History of migraine headaches 01/28/2013    REFERRING DIAG: right hip pain M25.551  THERAPY DIAG:  right hip pain; weakness     Rationale  for Evaluation and Treatment Rehabilitation  PERTINENT HISTORY:   Right hip arthroscopic surgery October 28, 2021  see protocol  next MD follow up 7/18   OPERATION PERFORMED: 1. Right hip arthroscopy (75102) [9.00]. 2. Synovectomy of central compartment (58527) [11.17]. 3. Synovectomy of peripheral compartment (78242) [11.17]. 4. Acetabuloplasty (35361) [15.00]. 5. Labral repair (44315) [15.00]. 6. Chondroplasty of acetabulum (40086) [11.17]. 7. Chondroplasty of femoral head (76195) [11.17]. 8. Femoroplasty (09326) [14.67]. 9. Debridement of ligamentum teres (71245) [17.37].    PRECAUTIONS:  precautions per protocol:   20 pounds foot flat partial weight bearing x 4 weeks (specific patient instructions say 2 weeks) Wean from crutches in brace weeks 3-4 Hip brace 0-90 degrees x 10 days then full ROM Brace wear 1-6 weeks as indicated by muscle control and comfort ROM limits: flexion 90 degrees x10 days, extension gentle x3 weeks; external rotation gentle x3 weeks; Internal rotation no limits; adduction <10 x6 weeks No DN  SUBJECTIVE:  Increasing weightbearing so it's a little irritated PAIN:  PAIN:  Are you having pain? Yes NPRS scale: 3-4/10 Pain location: right groin  OBJECTIVE: (objective measures completed at initial evaluation unless otherwise dated)  DIAGNOSTIC FINDINGS: labral tear  PATIENT SURVEYS:  FOTO 35% baseline       LOWER EXTREMITY ROM: limited per surgical precautions   passive ROM Right eval Left eval  Hip flexion 80 WFLs  Hip extension 0 WFLs  Hip abduction 5 WFLs  Hip adduction   WFLs  Hip internal rotation 10 WFLs  Hip external rotation   WFLs  Knee flexion      Knee extension      Ankle dorsiflexion      Ankle plantarflexion      Ankle inversion      Ankle eversion       (Blank rows = not tested)   LOWER EXTREMITY MMT:  not tested secondary to surgical precautions; able to do quad set, glute set and abdominal brace     GAIT: Distance  walked: 75 feet Assistive device utilized: Crutches Level of assistance: Modified independence Comments: brace on; slow pattern       TODAY'S TREATMENT: 11/18/21: Ex performed without brace  Seated: press into foam roll for TA activation 5" hold x15 Standing hip extension to 10 degrees using slider 2x10 Standing hip abduction to 30 degrees using slider 2x10  Quad rocking x 10 Passive hip internal rotation at 90 degrees 20x Supine assisted fig 4 with therapist supporting at knee hold 20 sec 3x Right knee to chest 3x 20 sec holds Left KTC with right leg straight (for easy right hip flexor stretch) 3x Right leg slight heel dangle off edge of mat for easy hip flexor stretch 3x Heel slides 15x Hip adduction: isometric using a ball: 5" hold x 20, abduction with manual resistance by PT 5" hold x20 - supine  Prone resisted internal and external rotation slightly bent knee 5 sec hold 10x each Prone piriformis stretch passive external rotation 10x Seated assisted fig 4 stretch with therapist supporting knee hold 20 sec 3x Arm bike: level 1x 6 minutes (3/3)-for endurance  11/17/21: Ex performed without brace  Seated: press into foam roll for TA activation 5" hold x15 Gait with 1 crutch: down the hallway- good pattern symmetry Quad rocking x 10 Passive hip internal rotation at 90 degrees 20x Supine assisted fig 4 with therapist supporting at knee hold 20 sec 3x Right knee to chest 3x 20 sec holds Left KTC with right leg straight (for easy right hip flexor stretch) 3x Right leg slight heel dangle off edge of mat for easy hip flexor stretch 3x Heel slides 15x Hip adduction: isometric using a ball: 5" hold x 20, abduction with manual resistance by PT 5" hold x20 - supine  Prone resisted internal and external rotation slightly bent knee 5 sec hold 10x each Prone piriformis stretch passive external rotation 10x Seated assisted fig 4 stretch with therapist supporting knee hold 20 sec 3x Arm bike:  level 1x 6 minutes (3/3)-for endurance   6/30: Ex performed without brace  Seated: press into foam roll for TA activation 5" hold x15 Glute set 8x Quad set 5" hold x 20 with tactile cueing Passive hip internal rotation at 90 degrees 20x Supine assisted fig 4 with therapist supporting at knee hold 20 sec 3x Right KTC 3x 20 sec holds Left KTC with right leg straight (for easy right hip flexor stretch) 3x Right leg slight heel dangle off edge of mat for easy hip flexor stretch 3x Heel slides 15x Hip adduction: isometric using a ball: 5" hold x 20, abduction with manual resistance by PT 5" hold x20 - supine  Prone resisted internal and  external rotation slightly bent knee 5 sec hold 10x each Prone piriformis stretch passive external rotation 10x Seated assisted fig 4 stretch with therapist supporting knee hold 20 sec 3x Arm bike: level 1x 6 minutes (3/3)-for endurance     PATIENT EDUCATION:  Education details: current HEP Person educated: Patient Education method: Customer service manager Education comprehension: verbalized understanding and returned demonstration     HOME EXERCISE PROGRAM: Access Code: [email or web address] URL: [email or web address] Date: 11/04/2021 Prepared by: Ruben Im  Exercises - Supine Heel Slide  - 1 x daily - 7 x weekly - 2 sets - 10 reps - Supine Hip Abduction  - 1 x daily - 7 x weekly - 1 sets - 5-10 reps - Supine Isometric Hip Adduction with Pillow at Knees  - 1 x daily - 7 x weekly - 1 sets - 5-10 reps - Prone Heel Squeeze  - 1 x daily - 7 x weekly - 1 sets - 5-10 reps As discussed   ASSESSMENT:   CLINICAL IMPRESSION: Pt arrived using 1 crutch for ambulation with good symmetry and pattern.  Pt started hip abduction to <30 degrees and extension <10 degrees in standing with slider under foot and mini squats <30 degrees.  She will add to HEP.   Pt is doing well with post-op protocol.  Pt is able to do hip flexor stretch independently and  all exercises per protocol without difficulty.  Patient will benefit from skilled PT to address the below impairments and improve overall function.     OBJECTIVE IMPAIRMENTS Abnormal gait, decreased activity tolerance, decreased mobility, difficulty walking, decreased ROM, decreased strength, impaired perceived functional ability, and pain.    ACTIVITY LIMITATIONS lifting, bending, sitting, standing, squatting, sleeping, stairs, and locomotion level   PARTICIPATION LIMITATIONS: meal prep, cleaning, laundry, driving, shopping, community activity, and occupation   PERSONAL FACTORS 1 comorbidity: previous back surgery and left hip surgery  are also affecting patient's functional outcome.    REHAB POTENTIAL: Good   CLINICAL DECISION MAKING: Stable/uncomplicated   EVALUATION COMPLEXITY: Low     GOALS: Goals reviewed with patient? Yes   SHORT TERM GOALS: Target date: 12/14/2021 The patient will demonstrate knowledge of basic self care strategies and exercises to promote healing   Baseline: Goal status: Met (11/09/21)   2.  The patient will report a 50% improvement in pain levels with functional activities which are currently difficult including bending over, sitting for work Baseline:  Goal status:Met   3.  The patient will have full hip ROM needed for basic ADLs  Baseline:  Goal status: in progress   4.  The patient will be able to full weight bear and ambulate household and short community distances with ease Baseline: 1 crutch now (11/18/21) Goal status: in progress    LONG TERM GOALS: Target date: 01/25/2022   The patient will be independent in a safe self progression of a home exercise program to promote further recovery of function   Baseline:  Goal status: INITIAL   2.  The patient will report a 75% improvement in pain levels with functional activities which are currently difficult including  Baseline:  Goal status: INITIAL   3.  Hip flexion, abduction, adduction and  rotation strength grossly 4/5 or within 75% of noninvolved side Baseline:  Goal status: INITIAL   4.  The patient will have returned to the majority of home and work ADLs including traveling for work Baseline:  Goal status: INITIAL   5.  FOTO score improved to 56%  Baseline:  Goal status: INITIAL         PLAN: PT FREQUENCY: 2-3x/week   PT DURATION: 12 weeks   PLANNED INTERVENTIONS: Therapeutic exercises, Therapeutic activity, Neuromuscular re-education, Patient/Family education, Joint mobilization, Aquatic Therapy,  Electrical stimulation, Cryotherapy, Taping, Ultrasound, Ionotophoresis 48m/ml Dexamethasone, and Manual therapy   PLAN FOR NEXT SESSION: per protocol date of surgery 6/15: fig 4 supine and seated; KTC; supine hip flexor stretch;  prone piriformis stretch, heel slides, hip abd/add isometrics, prone resisted internal and external rotation, work on gait with WBAT, standing hip abduction <30, extension <10 and 1/3 squats.  Pt will continue to benefit from skilled PT to address Rt LE strength, flexibility and mobility safely using post-op protocol.     KSigurd Sos PT 11/18/21 10:04 AM  [id number] [place] [address]Phone # [phone] [phone]

## 2021-11-22 ENCOUNTER — Ambulatory Visit: Payer: No Typology Code available for payment source

## 2021-11-22 DIAGNOSIS — R262 Difficulty in walking, not elsewhere classified: Secondary | ICD-10-CM

## 2021-11-22 DIAGNOSIS — M6281 Muscle weakness (generalized): Secondary | ICD-10-CM

## 2021-11-22 DIAGNOSIS — M25551 Pain in right hip: Secondary | ICD-10-CM

## 2021-11-22 NOTE — Therapy (Addendum)
OUTPATIENT PHYSICAL THERAPY TREATMENT NOTE   Patient Name: Kelsey [name] MRN: [id number] DOB:04/02/1966, 56 y.o., female Today's Date: 11/22/2021   REFERRING PROVIDER: Dr. Aretha Parrot  END OF SESSION:   PT End of Session - 11/22/21 0839     Visit Number 8    Date for PT Re-Evaluation 01/25/22    Authorization Type [employer] 40 visits    Authorization - Visit Number 8    Authorization - Number of Visits 40    PT Start Time 0800    PT Stop Time 0846    PT Time Calculation (min) 46 min    Activity Tolerance Patient tolerated treatment well    Behavior During Therapy [facility] for tasks assessed/performed                 Past Medical History:  Diagnosis Date   Abnormal Pap smear    Bulging lumbar disc    L4, l5--scheduled for surgery 05-04-18--Dr. Elsner   Hidradenitis    Hyperlipidemia    Infertility, female    no method of birth from late 20's through the 40's,  Headaches on OCP   Migraine    with aura   Sleep apnea 2021   Past Surgical History:  Procedure Laterality Date   ABDOMINAL EXPOSURE N/A 05/04/2018   Procedure: ABDOMINAL EXPOSURE;  Surgeon: Marty Heck, MD;  Location: [place];  Service: Vascular;  Laterality: N/A;   ANKLE SURGERY  1998/1999   left ankle fracture with ORIF   ANTERIOR LUMBAR FUSION N/A 05/04/2018   Procedure: Lumbar Four-Five Anterior  lumbar interbody fusion;  Surgeon: Kristeen Miss, MD;  Location: [place];  Service: Neurosurgery;  Laterality: N/A;  anterior   CARPAL TUNNEL RELEASE  05/21/12   right wrist   COLPOSCOPY     CIN I   HIP SURGERY Left 03-06-13   --[place] from injury   [place]  2000   off back - benign   Patient Active Problem List   Diagnosis Date Noted   Spondylolisthesis of lumbar region 05/04/2018   Degenerative disc disease, lumbar 04/17/2018   History of migraine headaches 01/28/2013    REFERRING DIAG: right hip pain M25.551  THERAPY DIAG:  right hip pain; weakness     Rationale  for Evaluation and Treatment Rehabilitation  PERTINENT HISTORY:   Right hip arthroscopic surgery October 28, 2021  see protocol  next MD follow up 7/18   OPERATION PERFORMED: 1. Right hip arthroscopy (40981) [9.00]. 2. Synovectomy of central compartment (19147) [11.17]. 3. Synovectomy of peripheral compartment (82956) [11.17]. 4. Acetabuloplasty (21308) [15.00]. 5. Labral repair (65784) [15.00]. 6. Chondroplasty of acetabulum (69629) [11.17]. 7. Chondroplasty of femoral head (52841) [11.17]. 8. Femoroplasty (32440) [14.67]. 9. Debridement of ligamentum teres (10272) [17.37].    PRECAUTIONS:  precautions per protocol:   20 pounds foot flat partial weight bearing x 4 weeks (specific patient instructions say 2 weeks) Wean from crutches in brace weeks 3-4 Hip brace 0-90 degrees x 10 days then full ROM Brace wear 1-6 weeks as indicated by muscle control and comfort ROM limits: flexion 90 degrees x10 days, extension gentle x3 weeks; external rotation gentle x3 weeks; Internal rotation no limits; adduction <10 x6 weeks No DN  SUBJECTIVE:  I want to walk with a cane.  PAIN:  PAIN:  Are you having pain? Yes NPRS scale: 1/10 Pain location: right groin  OBJECTIVE: (objective measures completed at initial evaluation unless otherwise dated)  DIAGNOSTIC FINDINGS:  labral tear   PATIENT SURVEYS:  FOTO 35% baseline       LOWER EXTREMITY ROM: limited per surgical precautions   passive ROM Right eval Left eval  Hip flexion 80 WFLs  Hip extension 0 WFLs  Hip abduction 5 WFLs  Hip adduction   WFLs  Hip internal rotation 10 WFLs  Hip external rotation   WFLs  Knee flexion      Knee extension      Ankle dorsiflexion      Ankle plantarflexion      Ankle inversion      Ankle eversion       (Blank rows = not tested)   LOWER EXTREMITY MMT:  not tested secondary to surgical precautions; able to do quad set, glute set and abdominal brace     GAIT: Distance walked: 75 feet Assistive  device utilized: Crutches Level of assistance: Modified independence Comments: brace on; slow pattern       TODAY'S TREATMENT: 11/22/21: Ex performed without brace  Seated: press into foam roll for TA activation 5" hold x15 Standing hip extension to 10 degrees using slider 2x10 Standing hip abduction to 30 degrees using slider 2x10  Quad rocking x 10 Passive hip internal rotation at 90 degrees 20x Supine assisted fig 4 with therapist supporting at knee hold 20 sec 3x Right knee to chest 3x 20 sec holds Left KTC with right leg straight (for easy right hip flexor stretch) 3x Right leg slight heel dangle off edge of mat for easy hip flexor stretch 3x Heel slides 15x Hip adduction: isometric using a ball: 5" hold x 20, abduction with manual resistance by PT 5" hold x20 - supine  Prone resisted internal and external rotation slightly bent knee 5 sec hold 10x each Prone piriformis stretch passive external rotation 10x Seated assisted fig 4 stretch with therapist supporting knee hold 20 sec 3x Arm bike: level 1x 6 minutes (3/3)-for endurance  Gait training with cane on the Lt- good pattern and symmetry up and down the hallway. 11/18/21: Ex performed without brace  Seated: press into foam roll for TA activation 5" hold x15 Standing hip extension to 10 degrees using slider 2x10 Standing hip abduction to 30 degrees using slider 2x10  Quad rocking x 10 Passive hip internal rotation at 90 degrees 20x Supine assisted fig 4 with therapist supporting at knee hold 20 sec 3x Right knee to chest 3x 20 sec holds Left KTC with right leg straight (for easy right hip flexor stretch) 3x Right leg slight heel dangle off edge of mat for easy hip flexor stretch 3x Heel slides 15x Hip adduction: isometric using a ball: 5" hold x 20, abduction with manual resistance by PT 5" hold x20 - supine  Prone resisted internal and external rotation slightly bent knee 5 sec hold 10x each Prone piriformis stretch  passive external rotation 10x Seated assisted fig 4 stretch with therapist supporting knee hold 20 sec 3x Arm bike: level 1x 6 minutes (3/3)-for endurance  11/17/21: Ex performed without brace  Seated: press into foam roll for TA activation 5" hold x15 Gait with 1 crutch: down the hallway- good pattern symmetry Quad rocking x 10 Passive hip internal rotation at 90 degrees 20x Supine assisted fig 4 with therapist supporting at knee hold 20 sec 3x Right knee to chest 3x 20 sec holds Left KTC with right leg straight (for easy right hip flexor stretch) 3x Right leg slight heel dangle off edge of mat for easy hip  flexor stretch 3x Heel slides 15x Hip adduction: isometric using a ball: 5" hold x 20, abduction with manual resistance by PT 5" hold x20 - supine  Prone resisted internal and external rotation slightly bent knee 5 sec hold 10x each Prone piriformis stretch passive external rotation 10x Seated assisted fig 4 stretch with therapist supporting knee hold 20 sec 3x Arm bike: level 1x 6 minutes (3/3)-for endurance   PATIENT EDUCATION:  Education details: current HEP Person educated: Patient Education method: Customer service manager Education comprehension: verbalized understanding and returned demonstration     HOME EXERCISE PROGRAM: Access Code: [email or web address] URL: [email or web address] Date: 11/04/2021 Prepared by: Ruben Im  Exercises - Supine Heel Slide  - 1 x daily - 7 x weekly - 2 sets - 10 reps - Supine Hip Abduction  - 1 x daily - 7 x weekly - 1 sets - 5-10 reps - Supine Isometric Hip Adduction with Pillow at Knees  - 1 x daily - 7 x weekly - 1 sets - 5-10 reps - Prone Heel Squeeze  - 1 x daily - 7 x weekly - 1 sets - 5-10 reps As discussed   ASSESSMENT:   CLINICAL IMPRESSION: Pt arrived using 1 crutch for ambulation with good symmetry and pattern.   Pt transitioned to using a cane on the Lt and demonstrate good pattern with this.  Pt performed  standing exercises while wearing brace.  Pt is doing well with post-op protocol.  PT provided verbal cues for technique throughout session.    Patient will benefit from skilled PT to address the below impairments and improve overall function.     OBJECTIVE IMPAIRMENTS Abnormal gait, decreased activity tolerance, decreased mobility, difficulty walking, decreased ROM, decreased strength, impaired perceived functional ability, and pain.    ACTIVITY LIMITATIONS lifting, bending, sitting, standing, squatting, sleeping, stairs, and locomotion level   PARTICIPATION LIMITATIONS: meal prep, cleaning, laundry, driving, shopping, community activity, and occupation   PERSONAL FACTORS 1 comorbidity: previous back surgery and left hip surgery  are also affecting patient's functional outcome.    REHAB POTENTIAL: Good   CLINICAL DECISION MAKING: Stable/uncomplicated   EVALUATION COMPLEXITY: Low     GOALS: Goals reviewed with patient? Yes   SHORT TERM GOALS: Target date: 12/14/2021 The patient will demonstrate knowledge of basic self care strategies and exercises to promote healing   Baseline: Goal status: Met (11/09/21)   2.  The patient will report a 50% improvement in pain levels with functional activities which are currently difficult including bending over, sitting for work Baseline:  Goal status:Met   3.  The patient will have full hip ROM needed for basic ADLs  Baseline:  Goal status: in progress   4.  The patient will be able to full weight bear and ambulate household and short community distances with ease Baseline: 1 crutch now (11/18/21) Goal status: in progress    LONG TERM GOALS: Target date: 01/25/2022   The patient will be independent in a safe self progression of a home exercise program to promote further recovery of function   Baseline:  Goal status: INITIAL   2.  The patient will report a 75% improvement in pain levels with functional activities which are currently difficult  including  Baseline:  Goal status: INITIAL   3.  Hip flexion, abduction, adduction and rotation strength grossly 4/5 or within 75% of noninvolved side Baseline:  Goal status: INITIAL   4.  The patient will have returned to the majority of home and work  ADLs including traveling for work Baseline:  Goal status: INITIAL   5.  FOTO score improved to 56%  Baseline:  Goal status: INITIAL         PLAN: PT FREQUENCY: 2-3x/week   PT DURATION: 12 weeks   PLANNED INTERVENTIONS: Therapeutic exercises, Therapeutic activity, Neuromuscular re-education, Patient/Family education, Joint mobilization, Aquatic Therapy,  Electrical stimulation, Cryotherapy, Taping, Ultrasound, Ionotophoresis 88m/ml Dexamethasone, and Manual therapy   PLAN FOR NEXT SESSION: per protocol date of surgery 6/15: fig 4 supine and seated; KTC; supine hip flexor stretch;  prone piriformis stretch, heel slides, hip abd/add isometrics, prone resisted internal and external rotation, work on gait with WBAT, standing hip abduction <30, extension <10 and 1/3 squats.  Pt will continue to benefit from skilled PT to address Rt LE strength, flexibility and mobility safely using post-op protocol.     KSigurd Sos PT 11/22/21 8:42 AM  [place] [address] [place] [id number] # [phone] [phone]

## 2021-11-24 ENCOUNTER — Ambulatory Visit: Payer: No Typology Code available for payment source | Admitting: Physical Therapy

## 2021-11-24 ENCOUNTER — Encounter: Payer: Self-pay | Admitting: Physical Therapy

## 2021-11-24 DIAGNOSIS — M25551 Pain in right hip: Secondary | ICD-10-CM

## 2021-11-24 DIAGNOSIS — R262 Difficulty in walking, not elsewhere classified: Secondary | ICD-10-CM

## 2021-11-24 NOTE — Therapy (Signed)
OUTPATIENT PHYSICAL THERAPY TREATMENT NOTE   Patient Name: Kelsey [name] MRN: [id number] DOB:[birth year], 56 y.o., female Today's Date: 11/24/2021   REFERRING PROVIDER: Dr. Aretha Parrot  END OF SESSION:   PT End of Session - 11/24/21 0842     Visit Number 9    Date for PT Re-Evaluation 01/25/22    Authorization Type [employer] 40 visits    Authorization - Visit Number 9    Authorization - Number of Visits 40    PT Start Time 0800    PT Stop Time 0845    PT Time Calculation (min) 45 min    Activity Tolerance Patient tolerated treatment well    Behavior During Therapy [facility] for tasks assessed/performed             Past Medical History:  Diagnosis Date   Abnormal Pap smear    Bulging lumbar disc    L4, l5--scheduled for surgery 05-04-18--Dr. Elsner   Hidradenitis    Hyperlipidemia    Infertility, female    no method of birth from late 20's through the 40's,  Headaches on OCP   Migraine    with aura   Sleep apnea 2021   Past Surgical History:  Procedure Laterality Date   ABDOMINAL EXPOSURE N/A 05/04/2018   Procedure: ABDOMINAL EXPOSURE;  Surgeon: Marty Heck, MD;  Location: [place];  Service: Vascular;  Laterality: N/A;   ANKLE SURGERY  1998/1999   left ankle fracture with ORIF   ANTERIOR LUMBAR FUSION N/A 05/04/2018   Procedure: Lumbar Four-Five Anterior  lumbar interbody fusion;  Surgeon: Kristeen Miss, MD;  Location: [place];  Service: Neurosurgery;  Laterality: N/A;  anterior   CARPAL TUNNEL RELEASE  05/21/12   right wrist   COLPOSCOPY     CIN I   HIP SURGERY Left 03-06-13   --[place] from injury   [place]  2000   off back - benign   Patient Active Problem List   Diagnosis Date Noted   Spondylolisthesis of lumbar region 05/04/2018   Degenerative disc disease, lumbar 04/17/2018   History of migraine headaches 01/28/2013   REFERRING DIAG: right hip pain M25.551   THERAPY DIAG:  right hip pain; weakness       Rationale for  Evaluation and Treatment Rehabilitation   PERTINENT HISTORY:   Right hip arthroscopic surgery October 28, 2021  see protocol  next MD follow up 7/18   OPERATION PERFORMED: 1. Right hip arthroscopy (53748) [9.00]. 2. Synovectomy of central compartment (27078) [11.17]. 3. Synovectomy of peripheral compartment (67544) [11.17]. 4. Acetabuloplasty (92010) [15.00]. 5. Labral repair (07121) [15.00]. 6. Chondroplasty of acetabulum (97588) [11.17]. 7. Chondroplasty of femoral head (32549) [11.17]. 8. Femoroplasty (82641) [14.67]. 9. Debridement of ligamentum teres (58309) [17.37].     PRECAUTIONS:  precautions per protocol:   20 pounds foot flat partial weight bearing x 4 weeks (specific patient instructions say 2 weeks) Wean from crutches in brace weeks 3-4 Hip brace 0-90 degrees x 10 days then full ROM Brace wear 1-6 weeks as indicated by muscle control and comfort ROM limits: flexion 90 degrees x10 days, extension gentle x3 weeks; external rotation gentle x3 weeks; Internal rotation no limits; adduction <10 x6 weeks No DN   SUBJECTIVE:  I want to walk with a cane.  PAIN:  PAIN:  Are you having pain? Yes NPRS scale: 1/10 Pain location: right groin   OBJECTIVE: (objective measures completed at initial evaluation unless otherwise dated)  DIAGNOSTIC FINDINGS: labral tear   PATIENT SURVEYS:  FOTO 35% baseline       LOWER EXTREMITY ROM: limited per surgical precautions   passive ROM Right eval Left eval  Hip flexion 80 WFLs  Hip extension 0 WFLs  Hip abduction 5 WFLs  Hip adduction   WFLs  Hip internal rotation 10 WFLs  Hip external rotation   WFLs  Knee flexion      Knee extension      Ankle dorsiflexion      Ankle plantarflexion      Ankle inversion      Ankle eversion       (Blank rows = not tested)   LOWER EXTREMITY MMT:  not tested secondary to surgical precautions; able to do quad set, glute set and abdominal brace     GAIT: Distance walked: 75 feet Assistive  device utilized: Crutches Level of assistance: Modified independence Comments: brace on; slow pattern       TODAY'S TREATMENT: 11/24/2021 Ex performed without brace  Seated: press into foam roll for TA activation 5" hold x15 Standing hip extension to 10 degrees using slider 2x10 Standing hip abduction to 30 degrees using slider 2x10  Quadricep rocking x 10 Passive hip internal rotation at 90 degrees 20x Supine assisted fig 4 with therapist supporting at knee hold 20 sec 3x Right knee to chest 3x 20 sec holds Left KTC with right leg straight (for easy right hip flexor stretch) 3x Right leg slight heel dangle off edge of mat for easy hip flexor stretch 3x Heel slides 15x Hip adduction: isometric using a ball: 5" hold x 20, abduction with manual resistance by PT 5" hold x20 - supine  Prone resisted internal and external rotation slightly bent knee 5 sec hold 10x each Prone piriformis stretch passive external rotation 10x Seated assisted fig 4 stretch with therapist supporting knee hold 20 sec 3x Arm bike: level 1x 6 minutes (3/3)-for endurance  Gait training with cane on the Lt- good pattern and symmetry up and down the hallway. 11/22/21: Ex performed without brace  Seated: press into foam roll for TA activation 5" hold x15 Standing hip extension to 10 degrees using slider 2x10 Standing hip abduction to 30 degrees using slider 2x10  Quad rocking x 10 Passive hip internal rotation at 90 degrees 20x Supine assisted fig 4 with therapist supporting at knee hold 20 sec 3x Right knee to chest 3x 20 sec holds Left KTC with right leg straight (for easy right hip flexor stretch) 3x Right leg slight heel dangle off edge of mat for easy hip flexor stretch 3x Heel slides 15x Hip adduction: isometric using a ball: 5" hold x 20, abduction with manual resistance by PT 5" hold x20 - supine  Prone resisted internal and external rotation slightly bent knee 5 sec hold 10x each Prone piriformis stretch  passive external rotation 10x Seated assisted fig 4 stretch with therapist supporting knee hold 20 sec 3x Arm bike: level 1x 6 minutes (3/3)-for endurance  Gait training with cane on the Lt- good pattern and symmetry up and down the hallway. 11/18/21: Ex performed without brace  Seated: press into foam roll for TA activation 5" hold x15 Standing hip extension to 10 degrees using slider 2x10 Standing hip abduction to 30 degrees using slider 2x10  Quad rocking x 10 Passive hip internal rotation at 90 degrees 20x Supine assisted fig 4 with therapist supporting at knee hold 20 sec 3x Right knee to chest 3x 20  sec holds Left KTC with right leg straight (for easy right hip flexor stretch) 3x Right leg slight heel dangle off edge of mat for easy hip flexor stretch 3x Heel slides 15x Hip adduction: isometric using a ball: 5" hold x 20, abduction with manual resistance by PT 5" hold x20 - supine  Prone resisted internal and external rotation slightly bent knee 5 sec hold 10x each Prone piriformis stretch passive external rotation 10x Seated assisted fig 4 stretch with therapist supporting knee hold 20 sec 3x Arm bike: level 1x 6 minutes (3/3)-for endurance  11/17/21: Ex performed without brace  Seated: press into foam roll for TA activation 5" hold x15 Gait with 1 crutch: down the hallway- good pattern symmetry Quad rocking x 10 Passive hip internal rotation at 90 degrees 20x Supine assisted fig 4 with therapist supporting at knee hold 20 sec 3x Right knee to chest 3x 20 sec holds Left KTC with right leg straight (for easy right hip flexor stretch) 3x Right leg slight heel dangle off edge of mat for easy hip flexor stretch 3x Heel slides 15x Hip adduction: isometric using a ball: 5" hold x 20, abduction with manual resistance by PT 5" hold x20 - supine  Prone resisted internal and external rotation slightly bent knee 5 sec hold 10x each Prone piriformis stretch passive external rotation  10x Seated assisted fig 4 stretch with therapist supporting knee hold 20 sec 3x Arm bike: level 1x 6 minutes (3/3)-for endurance    PATIENT EDUCATION:  Education details: current HEP Person educated: Patient Education method: Customer service manager Education comprehension: verbalized understanding and returned demonstration     HOME EXERCISE PROGRAM: Access Code: [email or web address] URL: [email or web address] Date: 11/04/2021 Prepared by: Ruben Im   Exercises - Supine Heel Slide  - 1 x daily - 7 x weekly - 2 sets - 10 reps - Supine Hip Abduction  - 1 x daily - 7 x weekly - 1 sets - 5-10 reps - Supine Isometric Hip Adduction with Pillow at Knees  - 1 x daily - 7 x weekly - 1 sets - 5-10 reps - Prone Heel Squeeze  - 1 x daily - 7 x weekly - 1 sets - 5-10 reps As discussed   ASSESSMENT:   CLINICAL IMPRESSION: Pt arrived using 1 crutch for ambulation with good symmetry and pattern.   Pt transitioned to using a cane on the Lt and demonstrate good pattern with this.  Pt performed standing exercises while wearing brace.  Pt is doing well with post-op protocol.  PT provided verbal cues for technique throughout session.   Patient is moving well on the mat and walking without pain.  Patient will benefit from skilled PT to address the below impairments and improve overall function.      OBJECTIVE IMPAIRMENTS Abnormal gait, decreased activity tolerance, decreased mobility, difficulty walking, decreased ROM, decreased strength, impaired perceived functional ability, and pain.    ACTIVITY LIMITATIONS lifting, bending, sitting, standing, squatting, sleeping, stairs, and locomotion level   PARTICIPATION LIMITATIONS: meal prep, cleaning, laundry, driving, shopping, community activity, and occupation   PERSONAL FACTORS 1 comorbidity: previous back surgery and left hip surgery  are also affecting patient's functional outcome.    REHAB POTENTIAL: Good   CLINICAL DECISION MAKING:  Stable/uncomplicated   EVALUATION COMPLEXITY: Low     GOALS: Goals reviewed with patient? Yes   SHORT TERM GOALS: Target date: 12/14/2021 The patient will demonstrate knowledge of basic self care strategies and exercises to promote healing  Baseline: Goal status: Met (11/09/21)   2.  The patient will report a 50% improvement in pain levels with functional activities which are currently difficult including bending over, sitting for work Baseline:  Goal status:Met   3.  The patient will have full hip ROM needed for basic ADLs  Baseline:  Goal status: in progress    4.  The patient will be able to full weight bear and ambulate household and short community distances with ease Baseline: 1 crutch now (11/18/21) Goal status: in progress    LONG TERM GOALS: Target date: 01/25/2022   The patient will be independent in a safe self progression of a home exercise program to promote further recovery of function   Baseline:  Goal status: INITIAL   2.  The patient will report a 75% improvement in pain levels with functional activities which are currently difficult including  Baseline:  Goal status: INITIAL   3.  Hip flexion, abduction, adduction and rotation strength grossly 4/5 or within 75% of noninvolved side Baseline:  Goal status: INITIAL   4.  The patient will have returned to the majority of home and work ADLs including traveling for work Baseline:  Goal status: INITIAL   5.  FOTO score improved to 56%  Baseline:  Goal status: INITIAL         PLAN: PT FREQUENCY: 2-3x/week   PT DURATION: 12 weeks   PLANNED INTERVENTIONS: Therapeutic exercises, Therapeutic activity, Neuromuscular re-education, Patient/Family education, Joint mobilization, Aquatic Therapy,  Electrical stimulation, Cryotherapy, Taping, Ultrasound, Ionotophoresis 60m/ml Dexamethasone, and Manual therapy   PLAN FOR NEXT SESSION: per protocol date of surgery 6/15: fig 4 supine and seated; KTC; supine hip flexor  stretch;  prone piriformis stretch, heel slides, hip abd/add isometrics, prone resisted internal and external rotation, work on gait with WBAT, standing hip abduction <30, extension <10 and 1/3 squats.  Pt will continue to benefit from skilled PT to address Rt LE strength, flexibility and mobility safely using post-op protocol.  Patient sees MD on Tuesday. Aquatics ?   CEarlie Counts PT 11/24/21 8:45 AM

## 2021-12-02 ENCOUNTER — Ambulatory Visit: Payer: No Typology Code available for payment source | Admitting: Physical Therapy

## 2021-12-02 DIAGNOSIS — M25551 Pain in right hip: Secondary | ICD-10-CM

## 2021-12-02 DIAGNOSIS — M6281 Muscle weakness (generalized): Secondary | ICD-10-CM

## 2021-12-02 DIAGNOSIS — R262 Difficulty in walking, not elsewhere classified: Secondary | ICD-10-CM

## 2021-12-02 NOTE — Therapy (Signed)
OUTPATIENT PHYSICAL THERAPY TREATMENT NOTE   Patient Name: Kelsey [name] MRN: [id number] DOB:[birth year], 56 y.o., female Today's Date: 12/02/2021   REFERRING PROVIDER: Dr. Aretha Parrot  END OF SESSION:   PT End of Session - 12/02/21 0756     Visit Number 10    Date for PT Re-Evaluation 01/25/22    Authorization Type [employer] 40 visits    Authorization - Visit Number 10    Authorization - Number of Visits 40    PT Start Time 0800    PT Stop Time 0840    PT Time Calculation (min) 40 min    Activity Tolerance Patient tolerated treatment well             Past Medical History:  Diagnosis Date   Abnormal Pap smear    Bulging lumbar disc    L4, l5--scheduled for surgery 05-04-18--Dr. Elsner   Hidradenitis    Hyperlipidemia    Infertility, female    no method of birth from late 20's through the 40's,  Headaches on OCP   Migraine    with aura   Sleep apnea 2021   Past Surgical History:  Procedure Laterality Date   ABDOMINAL EXPOSURE N/A 05/04/2018   Procedure: ABDOMINAL EXPOSURE;  Surgeon: Marty Heck, MD;  Location: [place];  Service: Vascular;  Laterality: N/A;   ANKLE SURGERY  1998/1999   left ankle fracture with ORIF   ANTERIOR LUMBAR FUSION N/A 05/04/2018   Procedure: Lumbar Four-Five Anterior  lumbar interbody fusion;  Surgeon: Kristeen Miss, MD;  Location: [place];  Service: Neurosurgery;  Laterality: N/A;  anterior   CARPAL TUNNEL RELEASE  05/21/12   right wrist   COLPOSCOPY     CIN I   HIP SURGERY Left 03-06-13   --[place] from injury   [place]  2000   off back - benign   Patient Active Problem List   Diagnosis Date Noted   Spondylolisthesis of lumbar region 05/04/2018   Degenerative disc disease, lumbar 04/17/2018   History of migraine headaches 01/28/2013   REFERRING DIAG: right hip pain M25.551   THERAPY DIAG:  right hip pain; weakness       Rationale for Evaluation and Treatment Rehabilitation   PERTINENT  HISTORY:   Right hip arthroscopic surgery October 28, 2021  see protocol  next MD follow up 7/18   OPERATION PERFORMED: 1. Right hip arthroscopy (17408) [9.00]. 2. Synovectomy of central compartment (14481) [11.17]. 3. Synovectomy of peripheral compartment (85631) [11.17]. 4. Acetabuloplasty (49702) [15.00]. 5. Labral repair (63785) [15.00]. 6. Chondroplasty of acetabulum (88502) [11.17]. 7. Chondroplasty of femoral head (77412) [11.17]. 8. Femoroplasty (87867) [14.67]. 9. Debridement of ligamentum teres (67209) [17.37].     PRECAUTIONS:  precautions per protocol:   20 pounds foot flat partial weight bearing x 4 weeks (specific patient instructions say 2 weeks) Wean from crutches in brace weeks 3-4 Hip brace 0-90 degrees x 10 days then full ROM Brace wear 1-6 weeks as indicated by muscle control and comfort ROM limits: flexion 90 degrees x10 days, extension gentle x3 weeks; external rotation gentle x3 weeks; Internal rotation no limits; adduction <10 x6 weeks No DN   SUBJECTIVE:   The patient reports she had a back spasm last week causing her to "go down".  She has been in severe pain in her back and anterior hip/thigh.   She saw Dr. Aretha Parrot and x-ray was performed (negative).  He felt she would benefit from  deep tissue massage to hip flexors.  She has been using TENS for low back but is unsure on where to use on her hip.  Using a cane today with considerable antalgia but states she had to go back to crutches earlier in the week.  Dr. Aretha Parrot did discharge hip brace. PAIN:  PAIN:  Are you having pain? Yes NPRS scale: 10/10 Pain location: right anterior hip and thigh, HS soreness  OBJECTIVE: (objective measures completed at initial evaluation unless otherwise dated)   DIAGNOSTIC FINDINGS: labral tear   PATIENT SURVEYS:  FOTO 35% baseline       LOWER EXTREMITY ROM: limited per surgical precautions   passive ROM Right eval Left eval  Hip flexion 80 WFLs  Hip extension 0 WFLs  Hip  abduction 5 WFLs  Hip adduction   WFLs  Hip internal rotation 10 WFLs  Hip external rotation   WFLs  Knee flexion      Knee extension      Ankle dorsiflexion      Ankle plantarflexion      Ankle inversion      Ankle eversion       (Blank rows = not tested)   LOWER EXTREMITY MMT:  not tested secondary to surgical precautions; able to do quad set, glute set and abdominal brace     GAIT: Distance walked: 75 feet Assistive device utilized: Crutches Level of assistance: Modified independence Comments: brace on; slow pattern       TODAY'S TREATMENT: 7/20: Discussion of TENS electrode placement on hip flexors,  use of heat or cold for pain management,  low level ex's for pain modulation Manual therapy: soft tissue mobilization to right hip flexors in supine first elevated on bolster than straight; Addaday to distal HS in prone Supine long axis distraction oscillation grade 1/2 Supine AP mob grade 1/2  Prone passive knee flexion with hip internal and external rotation gentle    11/24/2021 Ex performed without brace  Seated: press into foam roll for TA activation 5" hold x15 Standing hip extension to 10 degrees using slider 2x10 Standing hip abduction to 30 degrees using slider 2x10  Quadricep rocking x 10 Passive hip internal rotation at 90 degrees 20x Supine assisted fig 4 with therapist supporting at knee hold 20 sec 3x Right knee to chest 3x 20 sec holds Left KTC with right leg straight (for easy right hip flexor stretch) 3x Right leg slight heel dangle off edge of mat for easy hip flexor stretch 3x Heel slides 15x Hip adduction: isometric using a ball: 5" hold x 20, abduction with manual resistance by PT 5" hold x20 - supine  Prone resisted internal and external rotation slightly bent knee 5 sec hold 10x each Prone piriformis stretch passive external rotation 10x Seated assisted fig 4 stretch with therapist supporting knee hold 20 sec 3x Arm bike: level 1x 6 minutes  (3/3)-for endurance  Gait training with cane on the Lt- good pattern and symmetry up and down the hallway. 11/22/21: Ex performed without brace  Seated: press into foam roll for TA activation 5" hold x15 Standing hip extension to 10 degrees using slider 2x10 Standing hip abduction to 30 degrees using slider 2x10  Quad rocking x 10 Passive hip internal rotation at 90 degrees 20x Supine assisted fig 4 with therapist supporting at knee hold 20 sec 3x Right knee to chest 3x 20 sec holds Left KTC with right leg straight (for easy right hip flexor stretch) 3x Right leg slight heel  dangle off edge of mat for easy hip flexor stretch 3x Heel slides 15x Hip adduction: isometric using a ball: 5" hold x 20, abduction with manual resistance by PT 5" hold x20 - supine  Prone resisted internal and external rotation slightly bent knee 5 sec hold 10x each Prone piriformis stretch passive external rotation 10x Seated assisted fig 4 stretch with therapist supporting knee hold 20 sec 3x Arm bike: level 1x 6 minutes (3/3)-for endurance  Gait training with cane on the Lt- good pattern and symmetry up and down the hallway. PATIENT EDUCATION:  Education details: current HEP Person educated: Patient Education method: Customer service manager Education comprehension: verbalized understanding and returned demonstration     HOME EXERCISE PROGRAM: Access Code: [email or web address] URL: [email or web address] Date: 11/04/2021 Prepared by: Ruben Im   Exercises - Supine Heel Slide  - 1 x daily - 7 x weekly - 2 sets - 10 reps - Supine Hip Abduction  - 1 x daily - 7 x weekly - 1 sets - 5-10 reps - Supine Isometric Hip Adduction with Pillow at Knees  - 1 x daily - 7 x weekly - 1 sets - 5-10 reps - Prone Heel Squeeze  - 1 x daily - 7 x weekly - 1 sets - 5-10 reps As discussed   ASSESSMENT:   CLINICAL IMPRESSION: The patient had a change in status after an acute back spasm caused an increase in right  anterior hip/thigh pain.  She saw Dr. Aretha Parrot who felt his surgical repair was intact and not affected.  Treatment focus to address hip flexor spasm and pain as well as distal HS pain.  She reports considerable pain relief from manual therapy and notable improvement with transitional movements on the table, supine to sit and with ambulation.    OBJECTIVE IMPAIRMENTS Abnormal gait, decreased activity tolerance, decreased mobility, difficulty walking, decreased ROM, decreased strength, impaired perceived functional ability, and pain.    ACTIVITY LIMITATIONS lifting, bending, sitting, standing, squatting, sleeping, stairs, and locomotion level   PARTICIPATION LIMITATIONS: meal prep, cleaning, laundry, driving, shopping, community activity, and occupation   PERSONAL FACTORS 1 comorbidity: previous back surgery and left hip surgery  are also affecting patient's functional outcome.    REHAB POTENTIAL: Good   CLINICAL DECISION MAKING: Stable/uncomplicated   EVALUATION COMPLEXITY: Low     GOALS: Goals reviewed with patient? Yes   SHORT TERM GOALS: Target date: 12/14/2021 The patient will demonstrate knowledge of basic self care strategies and exercises to promote healing   Baseline: Goal status: Met (11/09/21)   2.  The patient will report a 50% improvement in pain levels with functional activities which are currently difficult including bending over, sitting for work Baseline:  Goal status:Met   3.  The patient will have full hip ROM needed for basic ADLs  Baseline:  Goal status: in progress    4.  The patient will be able to full weight bear and ambulate household and short community distances with ease Baseline: 1 crutch now (11/18/21) Goal status: in progress    LONG TERM GOALS: Target date: 01/25/2022   The patient will be independent in a safe self progression of a home exercise program to promote further recovery of function   Baseline:  Goal status: INITIAL   2.  The patient will  report a 75% improvement in pain levels with functional activities which are currently difficult including  Baseline:  Goal status: INITIAL   3.  Hip flexion, abduction, adduction and rotation strength grossly 4/5  or within 75% of noninvolved side Baseline:  Goal status: INITIAL   4.  The patient will have returned to the majority of home and work ADLs including traveling for work Baseline:  Goal status: INITIAL   5.  FOTO score improved to 56%  Baseline:  Goal status: INITIAL         PLAN: PT FREQUENCY: 2-3x/week   PT DURATION: 12 weeks   PLANNED INTERVENTIONS: Therapeutic exercises, Therapeutic activity, Neuromuscular re-education, Patient/Family education, Joint mobilization, Aquatic Therapy,  Electrical stimulation, Cryotherapy, Taping, Ultrasound, Ionotophoresis 49m/ml Dexamethasone, and Manual therapy   PLAN FOR NEXT SESSION: per protocol date of surgery 6/15;  may need further soft tissue work and hip mobs for pain control  SRuben Im PT 12/02/21 4:51 PM Phone: [phone]: [phone]

## 2021-12-03 ENCOUNTER — Ambulatory Visit: Payer: No Typology Code available for payment source | Admitting: Physical Therapy

## 2021-12-03 DIAGNOSIS — M25551 Pain in right hip: Secondary | ICD-10-CM

## 2021-12-03 DIAGNOSIS — M6281 Muscle weakness (generalized): Secondary | ICD-10-CM

## 2021-12-03 DIAGNOSIS — R262 Difficulty in walking, not elsewhere classified: Secondary | ICD-10-CM

## 2021-12-03 NOTE — Therapy (Signed)
OUTPATIENT PHYSICAL THERAPY TREATMENT NOTE   Patient Name: Kelsey [name] MRN: [id number] DOB:1966/03/07, 56 y.o., female Today's Date: 12/03/2021   REFERRING PROVIDER: Dr. Aretha Parrot  END OF SESSION:   PT End of Session - 12/03/21 0756     Visit Number 11    Date for PT Re-Evaluation 01/25/22    Authorization Type [employer] 40 visits    Authorization - Visit Number 10    Authorization - Number of Visits 40    PT Start Time 0758    PT Stop Time 0838    PT Time Calculation (min) 40 min    Activity Tolerance Patient tolerated treatment well             Past Medical History:  Diagnosis Date   Abnormal Pap smear    Bulging lumbar disc    L4, l5--scheduled for surgery 05-04-18--Dr. Elsner   Hidradenitis    Hyperlipidemia    Infertility, female    no method of birth from late 20's through the 40's,  Headaches on OCP   Migraine    with aura   Sleep apnea 2021   Past Surgical History:  Procedure Laterality Date   ABDOMINAL EXPOSURE N/A 05/04/2018   Procedure: ABDOMINAL EXPOSURE;  Surgeon: Marty Heck, MD;  Location: [facility];  Service: Vascular;  Laterality: N/A;   ANKLE SURGERY  1998/1999   left ankle fracture with ORIF   ANTERIOR LUMBAR FUSION N/A 05/04/2018   Procedure: Lumbar Four-Five Anterior  lumbar interbody fusion;  Surgeon: Kristeen Miss, MD;  Location: [place];  Service: Neurosurgery;  Laterality: N/A;  anterior   CARPAL TUNNEL RELEASE  05/21/12   right wrist   COLPOSCOPY     CIN I   HIP SURGERY Left 03-06-13   --[place] from injury   [place]  2000   off back - benign   Patient Active Problem List   Diagnosis Date Noted   Spondylolisthesis of lumbar region 05/04/2018   Degenerative disc disease, lumbar 04/17/2018   History of migraine headaches 01/28/2013   REFERRING DIAG: right hip pain M25.551   THERAPY DIAG:  right hip pain; weakness       Rationale for Evaluation and Treatment Rehabilitation   PERTINENT  HISTORY:   Right hip arthroscopic surgery October 28, 2021  see protocol  next MD follow up 7/18   OPERATION PERFORMED: 1. Right hip arthroscopy (37048) [9.00]. 2. Synovectomy of central compartment (88916) [11.17]. 3. Synovectomy of peripheral compartment (94503) [11.17]. 4. Acetabuloplasty (88828) [15.00]. 5. Labral repair (00349) [15.00]. 6. Chondroplasty of acetabulum (17915) [11.17]. 7. Chondroplasty of femoral head (05697) [11.17]. 8. Femoroplasty (94801) [14.67]. 9. Debridement of ligamentum teres (65537) [17.37].     PRECAUTIONS:  precautions per protocol:   20 pounds foot flat partial weight bearing x 4 weeks (specific patient instructions say 2 weeks) Wean from crutches in brace weeks 3-4 Hip brace 0-90 degrees x 10 days then full ROM Brace wear 1-6 weeks as indicated by muscle control and comfort ROM limits: flexion 90 degrees x10 days, extension gentle x3 weeks; external rotation gentle x3 weeks; Internal rotation no limits; adduction <10 x6 weeks No DN   SUBJECTIVE:     Antalgic gait, shifteded on arrival.  Patient reports some improvement since yesterday "sore but not the hurt".  Hip flexor, ITB, distal HS discomfort.      PAIN:  Are you having pain? Yes NPRS scale: 8/10 Pain location: right anterior hip  and thigh, HS soreness  OBJECTIVE: (objective measures completed at initial evaluation unless otherwise dated)   DIAGNOSTIC FINDINGS: labral tear   PATIENT SURVEYS:  FOTO 35% baseline       LOWER EXTREMITY ROM: limited per surgical precautions   passive ROM Right eval Left eval  Hip flexion 80 WFLs  Hip extension 0 WFLs  Hip abduction 5 WFLs  Hip adduction   WFLs  Hip internal rotation 10 WFLs  Hip external rotation   WFLs  Knee flexion      Knee extension      Ankle dorsiflexion      Ankle plantarflexion      Ankle inversion      Ankle eversion       (Blank rows = not tested)   LOWER EXTREMITY MMT:  not tested secondary to surgical precautions;  able to do quad set, glute set and abdominal brace     GAIT: Distance walked: 75 feet Assistive device utilized: Crutches Level of assistance: Modified independence Comments: brace on; slow pattern       TODAY'S TREATMENT: 7/21:  Manual therapy: soft tissue mobilization to right hip flexors and ITB in supine, prone HS manual soft tissue mobilization Supine long axis distraction oscillation grade 1/2 Supine AP mob grade 1/2  Prone passive knee flexion with hip internal and external rotation  Seated active piriformis stretch  Discussion of instead of SLR as her test for symptom improvement short lever hip flexion to avoid irritating  Review of HEP/protocol to discuss particular ex's to restart since flare up  7/20: Discussion of TENS electrode placement on hip flexors,  use of heat or cold for pain management,  low level ex's for pain modulation Manual therapy: soft tissue mobilization to right hip flexors in supine first elevated on bolster than straight; Addaday to distal HS in prone Supine long axis distraction oscillation grade 1/2 Supine AP mob grade 1/2  Prone passive knee flexion with hip internal and external rotation gentle    11/24/2021 Ex performed without brace  Seated: press into foam roll for TA activation 5" hold x15 Standing hip extension to 10 degrees using slider 2x10 Standing hip abduction to 30 degrees using slider 2x10  Quadricep rocking x 10 Passive hip internal rotation at 90 degrees 20x Supine assisted fig 4 with therapist supporting at knee hold 20 sec 3x Right knee to chest 3x 20 sec holds Left KTC with right leg straight (for easy right hip flexor stretch) 3x Right leg slight heel dangle off edge of mat for easy hip flexor stretch 3x Heel slides 15x Hip adduction: isometric using a ball: 5" hold x 20, abduction with manual resistance by PT 5" hold x20 - supine  Prone resisted internal and external rotation slightly bent knee 5 sec hold 10x  each Prone piriformis stretch passive external rotation 10x Seated assisted fig 4 stretch with therapist supporting knee hold 20 sec 3x Arm bike: level 1x 6 minutes (3/3)-for endurance  Gait training with cane on the Lt- good pattern and symmetry up and down the hallway. PATIENT EDUCATION:  Education details: current HEP Person educated: Patient Education method: Customer service manager Education comprehension: verbalized understanding and returned demonstration     HOME EXERCISE PROGRAM: Access Code: [email or web address] URL: [email or web address] Date: 11/04/2021 Prepared by: Ruben Im   Exercises - Supine Heel Slide  - 1 x daily - 7 x weekly - 2 sets - 10 reps - Supine Hip Abduction  - 1 x daily - 7 x  weekly - 1 sets - 5-10 reps - Supine Isometric Hip Adduction with Pillow at Knees  - 1 x daily - 7 x weekly - 1 sets - 5-10 reps - Prone Heel Squeeze  - 1 x daily - 7 x weekly - 1 sets - 5-10 reps As discussed   ASSESSMENT:   CLINICAL IMPRESSION: The patient is still recovering from an acute episode of back spasms which caused a flare of right hip flexor muscles.  Reduced spasm noted today in hip flexors with patient able to transfer and roll without therapist assist for right LE.  Improved hip ROM compared to yesterday as well.  Following treatment session, patient is standing fully erect with reduced gait antalgia.    OBJECTIVE IMPAIRMENTS Abnormal gait, decreased activity tolerance, decreased mobility, difficulty walking, decreased ROM, decreased strength, impaired perceived functional ability, and pain.    ACTIVITY LIMITATIONS lifting, bending, sitting, standing, squatting, sleeping, stairs, and locomotion level   PARTICIPATION LIMITATIONS: meal prep, cleaning, laundry, driving, shopping, community activity, and occupation   PERSONAL FACTORS 1 comorbidity: previous back surgery and left hip surgery  are also affecting patient's functional outcome.    REHAB  POTENTIAL: Good   CLINICAL DECISION MAKING: Stable/uncomplicated   EVALUATION COMPLEXITY: Low     GOALS: Goals reviewed with patient? Yes   SHORT TERM GOALS: Target date: 12/14/2021 The patient will demonstrate knowledge of basic self care strategies and exercises to promote healing   Baseline: Goal status: Met (11/09/21)   2.  The patient will report a 50% improvement in pain levels with functional activities which are currently difficult including bending over, sitting for work Baseline:  Goal status:Met   3.  The patient will have full hip ROM needed for basic ADLs  Baseline:  Goal status: in progress    4.  The patient will be able to full weight bear and ambulate household and short community distances with ease Baseline: 1 crutch now (11/18/21) Goal status: in progress    LONG TERM GOALS: Target date: 01/25/2022   The patient will be independent in a safe self progression of a home exercise program to promote further recovery of function   Baseline:  Goal status: INITIAL   2.  The patient will report a 75% improvement in pain levels with functional activities which are currently difficult including  Baseline:  Goal status: INITIAL   3.  Hip flexion, abduction, adduction and rotation strength grossly 4/5 or within 75% of noninvolved side Baseline:  Goal status: INITIAL   4.  The patient will have returned to the majority of home and work ADLs including traveling for work Baseline:  Goal status: INITIAL   5.  FOTO score improved to 56%  Baseline:  Goal status: INITIAL         PLAN: PT FREQUENCY: 2-3x/week   PT DURATION: 12 weeks   PLANNED INTERVENTIONS: Therapeutic exercises, Therapeutic activity, Neuromuscular re-education, Patient/Family education, Joint mobilization, Aquatic Therapy,  Electrical stimulation, Cryotherapy, Taping, Ultrasound, Ionotophoresis 71m/ml Dexamethasone, and Manual therapy   PLAN FOR NEXT SESSION: per protocol date of surgery 6/15;   may need further soft tissue work and hip mobs for pain control; return to surgical protocol as she recovers from this exacerbation  SRuben Im PT 12/03/21 12:14 PM Phone: [phone]: [phone]

## 2021-12-06 ENCOUNTER — Ambulatory Visit: Payer: No Typology Code available for payment source

## 2021-12-06 DIAGNOSIS — M25551 Pain in right hip: Secondary | ICD-10-CM

## 2021-12-06 DIAGNOSIS — M6281 Muscle weakness (generalized): Secondary | ICD-10-CM

## 2021-12-06 DIAGNOSIS — R262 Difficulty in walking, not elsewhere classified: Secondary | ICD-10-CM

## 2021-12-06 NOTE — Therapy (Signed)
OUTPATIENT PHYSICAL THERAPY TREATMENT NOTE   Patient Name: Kelsey [name] MRN: [id number] DOB:April 02, 1966, 56 y.o., female Today's Date: 12/06/2021   REFERRING PROVIDER: Dr. Aretha Parrot  END OF SESSION:   PT End of Session - 12/06/21 0836     Visit Number 12    Date for PT Re-Evaluation 01/25/22    Authorization Type [employer] 40 visits    Authorization - Visit Number 11    Authorization - Number of Visits 40    PT Start Time 0759    PT Stop Time 0841    PT Time Calculation (min) 42 min    Activity Tolerance Patient tolerated treatment well    Behavior During Therapy [facility] for tasks assessed/performed              Past Medical History:  Diagnosis Date   Abnormal Pap smear    Bulging lumbar disc    L4, l5--scheduled for surgery 05-04-18--Dr. Elsner   Hidradenitis    Hyperlipidemia    Infertility, female    no method of birth from late 20's through the 40's,  Headaches on OCP   Migraine    with aura   Sleep apnea 2021   Past Surgical History:  Procedure Laterality Date   ABDOMINAL EXPOSURE N/A 05/04/2018   Procedure: ABDOMINAL EXPOSURE;  Surgeon: Marty Heck, MD;  Location: [place];  Service: Vascular;  Laterality: N/A;   ANKLE SURGERY  1998/1999   left ankle fracture with ORIF   ANTERIOR LUMBAR FUSION N/A 05/04/2018   Procedure: Lumbar Four-Five Anterior  lumbar interbody fusion;  Surgeon: Kristeen Miss, MD;  Location: [place];  Service: Neurosurgery;  Laterality: N/A;  anterior   CARPAL TUNNEL RELEASE  05/21/12   right wrist   COLPOSCOPY     CIN I   HIP SURGERY Left 03-06-13   --[place] from injury   [place]  2000   off back - benign   Patient Active Problem List   Diagnosis Date Noted   Spondylolisthesis of lumbar region 05/04/2018   Degenerative disc disease, lumbar 04/17/2018   History of migraine headaches 01/28/2013   REFERRING DIAG: right hip pain M25.551   THERAPY DIAG:  right hip pain; weakness       Rationale  for Evaluation and Treatment Rehabilitation   PERTINENT HISTORY:   Right hip arthroscopic surgery October 28, 2021  see protocol  next MD follow up 7/18   OPERATION PERFORMED: 1. Right hip arthroscopy (27062) [9.00]. 2. Synovectomy of central compartment (37628) [11.17]. 3. Synovectomy of peripheral compartment (31517) [11.17]. 4. Acetabuloplasty (61607) [15.00]. 5. Labral repair (37106) [15.00]. 6. Chondroplasty of acetabulum (26948) [11.17]. 7. Chondroplasty of femoral head (54627) [11.17]. 8. Femoroplasty (03500) [14.67]. 9. Debridement of ligamentum teres (93818) [17.37].     PRECAUTIONS:  precautions per protocol:   20 pounds foot flat partial weight bearing x 4 weeks (specific patient instructions say 2 weeks) Wean from crutches in brace weeks 3-4 Hip brace 0-90 degrees x 10 days then full ROM Brace wear 1-6 weeks as indicated by muscle control and comfort ROM limits: flexion 90 degrees x10 days, extension gentle x3 weeks; external rotation gentle x3 weeks; Internal rotation no limits; adduction <10 x6 weeks No DN   SUBJECTIVE:     I am still sore and stiff.  I was more active this weekend and my leg really hung in there.      PAIN:  Are you having pain? Yes NPRS  scale: 6/10 Pain location: right anterior hip and thigh, HS soreness  Aggravating with standing and walking   OBJECTIVE: (objective measures completed at initial evaluation unless otherwise dated)   DIAGNOSTIC FINDINGS: labral tear   PATIENT SURVEYS:  FOTO 35% baseline       LOWER EXTREMITY ROM: limited per surgical precautions   passive ROM Right eval Left eval  Hip flexion 80 WFLs  Hip extension 0 WFLs  Hip abduction 5 WFLs  Hip adduction   WFLs  Hip internal rotation 10 WFLs  Hip external rotation   WFLs  Knee flexion      Knee extension      Ankle dorsiflexion      Ankle plantarflexion      Ankle inversion      Ankle eversion       (Blank rows = not tested)   LOWER EXTREMITY MMT:  not  tested secondary to surgical precautions; able to do quad set, glute set and abdominal brace     GAIT: Distance walked: 75 feet Assistive device utilized: Crutches Level of assistance: Modified independence Comments: brace on; slow pattern       TODAY'S TREATMENT: 12/06/2021 Ex performed without brace  Seated: press into foam roll for TA activation 5" hold x15 Attempted elliptical- too much pull on the Rt hip flexor so stopped after 1 minute  Sit to stand: 2x5 Supine assisted fig 4 with therapist supporting at knee hold 20 sec 3x Rt hip flexor stretch off edge of bed, added knee flexion to increase the stretch Left KTC with right leg straight (for easy right hip flexor stretch) 3x Right leg slight heel dangle off edge of mat for easy hip flexor stretch 3x Heel slides 15x Side stepping at counter top Rt and Lt 4" lateral step down 2x10 Seated march (short level hip flexion) 2x10  Arm bike: level 1x 6 minutes (3/3)-for endurance Gait up and down hallway with cane: improved symmetry and weight shift post session.    7/21:  Manual therapy: soft tissue mobilization to right hip flexors and ITB in supine, prone HS manual soft tissue mobilization Supine long axis distraction oscillation grade 1/2 Supine AP mob grade 1/2  Prone passive knee flexion with hip internal and external rotation  Seated active piriformis stretch  Discussion of instead of SLR as her test for symptom improvement short lever hip flexion to avoid irritating  Review of HEP/protocol to discuss particular ex's to restart since flare up  7/20: Discussion of TENS electrode placement on hip flexors,  use of heat or cold for pain management,  low level ex's for pain modulation Manual therapy: soft tissue mobilization to right hip flexors in supine first elevated on bolster than straight; Addaday to distal HS in prone Supine long axis distraction oscillation grade 1/2 Supine AP mob grade 1/2  Prone passive knee  flexion with hip internal and external rotation gentle   PATIENT EDUCATION:  Education details: current HEP Person educated: Patient Education method: Customer service manager Education comprehension: verbalized understanding and returned demonstration     HOME EXERCISE PROGRAM: Access Code: [email or web address] URL: [email or web address] Date: 11/04/2021 Prepared by: Ruben Im   Exercises - Supine Heel Slide  - 1 x daily - 7 x weekly - 2 sets - 10 reps - Supine Hip Abduction  - 1 x daily - 7 x weekly - 1 sets - 5-10 reps - Supine Isometric Hip Adduction with Pillow at Knees  - 1 x daily - 7 x weekly -  1 sets - 5-10 reps - Prone Heel Squeeze  - 1 x daily - 7 x weekly - 1 sets - 5-10 reps As discussed   ASSESSMENT:   CLINICAL IMPRESSION: Pt continues to recover  from an acute episode of back spasms which caused a flare of right hip flexor muscles.  Pt reports that she had a good weekend and was able to do more.  We attempted to do the elliptical and this was too painful.  Pt was able to participate in more activity today and will continue to advance as tolerated.  PT monitored for pain and technique throughout session .  Pt will continue to benefit from skilled PT to improve strength and function following post-op protocol.  OBJECTIVE IMPAIRMENTS Abnormal gait, decreased activity tolerance, decreased mobility, difficulty walking, decreased ROM, decreased strength, impaired perceived functional ability, and pain.    ACTIVITY LIMITATIONS lifting, bending, sitting, standing, squatting, sleeping, stairs, and locomotion level   PARTICIPATION LIMITATIONS: meal prep, cleaning, laundry, driving, shopping, community activity, and occupation   PERSONAL FACTORS 1 comorbidity: previous back surgery and left hip surgery  are also affecting patient's functional outcome.    REHAB POTENTIAL: Good   CLINICAL DECISION MAKING: Stable/uncomplicated   EVALUATION COMPLEXITY: Low      GOALS: Goals reviewed with patient? Yes   SHORT TERM GOALS: Target date: 12/14/2021 The patient will demonstrate knowledge of basic self care strategies and exercises to promote healing   Baseline: Goal status: Met (11/09/21)   2.  The patient will report a 50% improvement in pain levels with functional activities which are currently difficult including bending over, sitting for work Baseline:  Goal status:Met   3.  The patient will have full hip ROM needed for basic ADLs  Baseline:  Goal status: in progress    4.  The patient will be able to full weight bear and ambulate household and short community distances with ease Baseline: 1 crutch now (11/18/21) Goal status: in progress    LONG TERM GOALS: Target date: 01/25/2022   The patient will be independent in a safe self progression of a home exercise program to promote further recovery of function   Baseline:  Goal status: In progress    2.  The patient will report a 75% improvement in pain levels with functional activities which are currently difficult including  Baseline:  Goal status: INITIAL   3.  Hip flexion, abduction, adduction and rotation strength grossly 4/5 or within 75% of noninvolved side Baseline:  Goal status: INITIAL   4.  The patient will have returned to the majority of home and work ADLs including traveling for work Baseline:  Goal status: INITIAL   5.  FOTO score improved to 56%  Baseline:  Goal status: INITIAL         PLAN: PT FREQUENCY: 2-3x/week   PT DURATION: 12 weeks   PLANNED INTERVENTIONS: Therapeutic exercises, Therapeutic activity, Neuromuscular re-education, Patient/Family education, Joint mobilization, Aquatic Therapy,  Electrical stimulation, Cryotherapy, Taping, Ultrasound, Ionotophoresis 74m/ml Dexamethasone, and Manual therapy   PLAN FOR NEXT SESSION: per protocol date of surgery 6/15;  follow protocol as able, try light leg press KSigurd Sos PT 12/06/21 8:37 AM   Phone:  [phone]: [phone]

## 2021-12-08 ENCOUNTER — Ambulatory Visit: Payer: No Typology Code available for payment source

## 2021-12-08 DIAGNOSIS — R262 Difficulty in walking, not elsewhere classified: Secondary | ICD-10-CM

## 2021-12-08 DIAGNOSIS — M6281 Muscle weakness (generalized): Secondary | ICD-10-CM

## 2021-12-08 DIAGNOSIS — M25551 Pain in right hip: Secondary | ICD-10-CM

## 2021-12-08 NOTE — Therapy (Signed)
OUTPATIENT PHYSICAL THERAPY TREATMENT NOTE   Patient Name: Kelsey [name] MRN: [id number] DOB:[birth year], 56 y.o., female Today's Date: 12/08/2021   REFERRING PROVIDER: Dr. Aretha Parrot  END OF SESSION:   PT End of Session - 12/08/21 0847     Visit Number 13    Date for PT Re-Evaluation 01/25/22    Authorization Type [employer] 40 visits    Authorization - Visit Number 12    Authorization - Number of Visits 40    PT Start Time 0801    PT Stop Time 8527    PT Time Calculation (min) 46 min    Activity Tolerance Patient tolerated treatment well    Behavior During Therapy [facility] for tasks assessed/performed               Past Medical History:  Diagnosis Date   Abnormal Pap smear    Bulging lumbar disc    L4, l5--scheduled for surgery 05-04-18--Dr. Elsner   Hidradenitis    Hyperlipidemia    Infertility, female    no method of birth from late 20's through the 40's,  Headaches on OCP   Migraine    with aura   Sleep apnea 2021   Past Surgical History:  Procedure Laterality Date   ABDOMINAL EXPOSURE N/A 05/04/2018   Procedure: ABDOMINAL EXPOSURE;  Surgeon: Marty Heck, MD;  Location: [place];  Service: Vascular;  Laterality: N/A;   ANKLE SURGERY  1998/1999   left ankle fracture with ORIF   ANTERIOR LUMBAR FUSION N/A 05/04/2018   Procedure: Lumbar Four-Five Anterior  lumbar interbody fusion;  Surgeon: Kristeen Miss, MD;  Location: [place];  Service: Neurosurgery;  Laterality: N/A;  anterior   CARPAL TUNNEL RELEASE  05/21/12   right wrist   COLPOSCOPY     CIN I   HIP SURGERY Left 03-06-13   --[place] from injury   [place]  2000   off back - benign   Patient Active Problem List   Diagnosis Date Noted   Spondylolisthesis of lumbar region 05/04/2018   Degenerative disc disease, lumbar 04/17/2018   History of migraine headaches 01/28/2013   REFERRING DIAG: right hip pain M25.551   THERAPY DIAG:  right hip pain; weakness       Rationale  for Evaluation and Treatment Rehabilitation   PERTINENT HISTORY:   Right hip arthroscopic surgery October 28, 2021  see protocol  next MD follow up 7/18   OPERATION PERFORMED: 1. Right hip arthroscopy (78242) [9.00]. 2. Synovectomy of central compartment (35361) [11.17]. 3. Synovectomy of peripheral compartment (44315) [11.17]. 4. Acetabuloplasty (40086) [15.00]. 5. Labral repair (76195) [15.00]. 6. Chondroplasty of acetabulum (09326) [11.17]. 7. Chondroplasty of femoral head (71245) [11.17]. 8. Femoroplasty (80998) [14.67]. 9. Debridement of ligamentum teres (33825) [17.37].     PRECAUTIONS:  precautions per protocol:   20 pounds foot flat partial weight bearing x 4 weeks (specific patient instructions say 2 weeks) Wean from crutches in brace weeks 3-4 Hip brace 0-90 degrees x 10 days then full ROM Brace wear 1-6 weeks as indicated by muscle control and comfort ROM limits: flexion 90 degrees x10 days, extension gentle x3 weeks; external rotation gentle x3 weeks; Internal rotation no limits; adduction <10 x6 weeks No DN   SUBJECTIVE:    I am feeling better overall.     PAIN:  Are you having pain? Yes NPRS scale: 4/10 Pain location: right anterior hip and thigh, HS soreness  Aggravating with standing and  walking   OBJECTIVE: (objective measures completed at initial evaluation unless otherwise dated)   DIAGNOSTIC FINDINGS: labral tear   PATIENT SURVEYS:  FOTO 35% baseline       LOWER EXTREMITY ROM: limited per surgical precautions   passive ROM Right eval Left eval  Hip flexion 80 WFLs  Hip extension 0 WFLs  Hip abduction 5 WFLs  Hip adduction   WFLs  Hip internal rotation 10 WFLs  Hip external rotation   WFLs  Knee flexion      Knee extension      Ankle dorsiflexion      Ankle plantarflexion      Ankle inversion      Ankle eversion       (Blank rows = not tested)   LOWER EXTREMITY MMT:  not tested secondary to surgical precautions; able to do quad set, glute  set and abdominal brace     GAIT: Distance walked: 75 feet Assistive device utilized: Crutches Level of assistance: Modified independence Comments: brace on; slow pattern       TODAY'S TREATMENT: 12/08/2021 Seated: press into foam roll for TA activation 5" hold x15 Elliptical: Ramp 3x2.5 minutes  Sit to stand: 2x5 Leg press: seat 6, 40#  2x10 bil- verbal cues to keep hips neutral.   Sideplank on knees Rt x10, Lt isometric only due to challenge and pull on Rt hip Rt hip flexor stretch off edge of bed, added knee flexion to increase the stretch Left KTC with right leg straight (for easy right hip flexor stretch) 3x Right leg slight heel dangle off edge of mat for easy hip flexor stretch 3x Bridge: partial ROM with glute contraction 2x10 Side stepping without UE support Rt and Lt 4" lateral step down 2x10- good control.  Seated march (short level hip flexion) 2x10 added 2# added  Arm bike: level 1x 6 minutes (3/3)-for endurance   12/06/2021 Seated: press into foam roll for TA activation 5" hold x15 Attempted elliptical- too much pull on the Rt hip flexor so stopped after 1 minute  Sit to stand: 2x5 Supine assisted fig 4 with therapist supporting at knee hold 20 sec 3x Rt hip flexor stretch off edge of bed, added knee flexion to increase the stretch Left KTC with right leg straight (for easy right hip flexor stretch) 3x Right leg slight heel dangle off edge of mat for easy hip flexor stretch 3x Heel slides 15x Side stepping at counter top Rt and Lt 4" lateral step down 2x10 Seated march (short level hip flexion) 2x10  Arm bike: level 1x 6 minutes (3/3)-for endurance Gait up and down hallway with cane: improved symmetry and weight shift post session.    7/21:  Manual therapy: soft tissue mobilization to right hip flexors and ITB in supine, prone HS manual soft tissue mobilization Supine long axis distraction oscillation grade 1/2 Supine AP mob grade 1/2  Prone passive knee  flexion with hip internal and external rotation  Seated active piriformis stretch  Discussion of instead of SLR as her test for symptom improvement short lever hip flexion to avoid irritating  Review of HEP/protocol to discuss particular ex's to restart since flare up  PATIENT EDUCATION:  Education details: current HEP Person educated: Patient Education method: Customer service manager Education comprehension: verbalized understanding and returned demonstration     HOME EXERCISE PROGRAM: Access Code: [facility] URL: [email or web address] Date: 11/04/2021 Prepared by: Ruben Im   Exercises - Supine Heel Slide  - 1 x daily - 7 x  weekly - 2 sets - 10 reps - Supine Hip Abduction  - 1 x daily - 7 x weekly - 1 sets - 5-10 reps - Supine Isometric Hip Adduction with Pillow at Knees  - 1 x daily - 7 x weekly - 1 sets - 5-10 reps - Prone Heel Squeeze  - 1 x daily - 7 x weekly - 1 sets - 5-10 reps As discussed   ASSESSMENT:   CLINICAL IMPRESSION: Pt with significant improvement in mobility today due to reduced pain.   Gait is more symmetrical and pain is reduced overall.  Pt did well with the advancement of protocol exercises including lateral step downs, leg press, side plank on knees and bridge with band. She was challenged by bridge and sideplank and performed isometrically and with reduced ROM.   PT monitored for pain and technique throughout session .  Pt will continue to benefit from skilled PT to improve strength and function following post-op protocol.  OBJECTIVE IMPAIRMENTS Abnormal gait, decreased activity tolerance, decreased mobility, difficulty walking, decreased ROM, decreased strength, impaired perceived functional ability, and pain.    ACTIVITY LIMITATIONS lifting, bending, sitting, standing, squatting, sleeping, stairs, and locomotion level   PARTICIPATION LIMITATIONS: meal prep, cleaning, laundry, driving, shopping, community activity, and occupation    PERSONAL FACTORS 1 comorbidity: previous back surgery and left hip surgery  are also affecting patient's functional outcome.    REHAB POTENTIAL: Good   CLINICAL DECISION MAKING: Stable/uncomplicated   EVALUATION COMPLEXITY: Low     GOALS: Goals reviewed with patient? Yes   SHORT TERM GOALS: Target date: 12/14/2021 The patient will demonstrate knowledge of basic self care strategies and exercises to promote healing   Baseline: Goal status: Met (11/09/21)   2.  The patient will report a 50% improvement in pain levels with functional activities which are currently difficult including bending over, sitting for work Baseline:  Goal status:Met   3.  The patient will have full hip ROM needed for basic ADLs  Baseline:  Goal status: in progress    4.  The patient will be able to full weight bear and ambulate household and short community distances with ease Baseline: 1 crutch now (11/18/21) Goal status: in progress    LONG TERM GOALS: Target date: 01/25/2022   The patient will be independent in a safe self progression of a home exercise program to promote further recovery of function   Baseline:  Goal status: In progress    2.  The patient will report a 75% improvement in pain levels with functional activities which are currently difficult including  Baseline:  Goal status: INITIAL   3.  Hip flexion, abduction, adduction and rotation strength grossly 4/5 or within 75% of noninvolved side Baseline:  Goal status: INITIAL   4.  The patient will have returned to the majority of home and work ADLs including traveling for work Baseline:  Goal status: INITIAL   5.  FOTO score improved to 56%  Baseline:  Goal status: INITIAL         PLAN: PT FREQUENCY: 2-3x/week   PT DURATION: 12 weeks   PLANNED INTERVENTIONS: Therapeutic exercises, Therapeutic activity, Neuromuscular re-education, Patient/Family education, Joint mobilization, Aquatic Therapy,  Electrical stimulation,  Cryotherapy, Taping, Ultrasound, Ionotophoresis 75m/ml Dexamethasone, and Manual therapy   PLAN FOR NEXT SESSION: per protocol date of surgery 6/15;  work on sideplank on knees and bridges.   KSigurd Sos PT 12/08/21 8:51 AM   Phone: [phone]: [phone]

## 2021-12-14 ENCOUNTER — Ambulatory Visit: Payer: No Typology Code available for payment source | Attending: Orthopedic Surgery | Admitting: Physical Therapy

## 2021-12-14 DIAGNOSIS — M25551 Pain in right hip: Secondary | ICD-10-CM | POA: Diagnosis present

## 2021-12-14 DIAGNOSIS — R262 Difficulty in walking, not elsewhere classified: Secondary | ICD-10-CM | POA: Insufficient documentation

## 2021-12-14 DIAGNOSIS — M6281 Muscle weakness (generalized): Secondary | ICD-10-CM | POA: Insufficient documentation

## 2021-12-14 DIAGNOSIS — M5459 Other low back pain: Secondary | ICD-10-CM | POA: Diagnosis present

## 2021-12-14 NOTE — Therapy (Signed)
OUTPATIENT PHYSICAL THERAPY TREATMENT NOTE   Patient Name: Kelsey [name] MRN: [id number] DOB:07/17/1965, 56 y.o., female Today's Date: 12/15/2021   REFERRING PROVIDER: Dr. Aretha Parrot  END OF SESSION:   PT End of Session - 12/15/21 0737     Visit Number 14    Date for PT Re-Evaluation 01/25/22    Authorization Type [employer] 40 visits    Authorization - Visit Number 14    Authorization - Number of Visits 40    PT Start Time 1106    PT Stop Time 1146    PT Time Calculation (min) 40 min    Activity Tolerance Patient tolerated treatment well    Behavior During Therapy [facility] for tasks assessed/performed                Past Medical History:  Diagnosis Date   Abnormal Pap smear    Bulging lumbar disc    L4, l5--scheduled for surgery 05-04-18--Dr. Elsner   Hidradenitis    Hyperlipidemia    Infertility, female    no method of birth from late 20's through the 40's,  Headaches on OCP   Migraine    with aura   Sleep apnea 2021   Past Surgical History:  Procedure Laterality Date   ABDOMINAL EXPOSURE N/A 05/04/2018   Procedure: ABDOMINAL EXPOSURE;  Surgeon: Marty Heck, MD;  Location: [place];  Service: Vascular;  Laterality: N/A;   ANKLE SURGERY  1998/1999   left ankle fracture with ORIF   ANTERIOR LUMBAR FUSION N/A 05/04/2018   Procedure: Lumbar Four-Five Anterior  lumbar interbody fusion;  Surgeon: Kristeen Miss, MD;  Location: [place];  Service: Neurosurgery;  Laterality: N/A;  anterior   CARPAL TUNNEL RELEASE  05/21/12   right wrist   COLPOSCOPY     CIN I   HIP SURGERY Left 03-06-13   --[employer] from injury   [place]  2000   off back - benign   Patient Active Problem List   Diagnosis Date Noted   Spondylolisthesis of lumbar region 05/04/2018   Degenerative disc disease, lumbar 04/17/2018   History of migraine headaches 01/28/2013   REFERRING DIAG: right hip pain M25.551   THERAPY DIAG:  right hip pain; weakness       Rationale  for Evaluation and Treatment Rehabilitation   PERTINENT HISTORY:   Right hip arthroscopic surgery October 28, 2021  see protocol  next MD follow up 7/18   OPERATION PERFORMED: 1. Right hip arthroscopy (32122) [9.00]. 2. Synovectomy of central compartment (48250) [11.17]. 3. Synovectomy of peripheral compartment (03704) [11.17]. 4. Acetabuloplasty (88891) [15.00]. 5. Labral repair (69450) [15.00]. 6. Chondroplasty of acetabulum (38882) [11.17]. 7. Chondroplasty of femoral head (80034) [11.17]. 8. Femoroplasty (91791) [14.67]. 9. Debridement of ligamentum teres (50569) [17.37].     PRECAUTIONS:  precautions per protocol:   20 pounds foot flat partial weight bearing x 4 weeks (specific patient instructions say 2 weeks) Wean from crutches in brace weeks 3-4 Hip brace 0-90 degrees x 10 days then full ROM Brace wear 1-6 weeks as indicated by muscle control and comfort ROM limits: flexion 90 degrees x10 days, extension gentle x3 weeks; external rotation gentle x3 weeks; Internal rotation no limits; adduction <10 x6 weeks No DN   SUBJECTIVE:    My hip flexor is angry today.  I can tell I am leaning to the left again.  It is frustrating and I just want to be better. I was really excited  about doing the eliptical last time and would still like to try it.     PAIN:  Are you having pain? Yes NPRS scale: 4/10 Pain location: right anterior hip and thigh, HS soreness  Aggravating with standing and walking   OBJECTIVE: (objective measures completed at initial evaluation unless otherwise dated)   DIAGNOSTIC FINDINGS: labral tear   PATIENT SURVEYS:  FOTO 35% baseline       LOWER EXTREMITY ROM: limited per surgical precautions   passive ROM Right eval Left eval  Hip flexion 80 WFLs  Hip extension 0 WFLs  Hip abduction 5 WFLs  Hip adduction   WFLs  Hip internal rotation 10 WFLs  Hip external rotation   WFLs  Knee flexion      Knee extension      Ankle dorsiflexion      Ankle  plantarflexion      Ankle inversion      Ankle eversion       (Blank rows = not tested)   LOWER EXTREMITY MMT:  not tested secondary to surgical precautions; able to do quad set, glute set and abdominal brace     GAIT: Distance walked: 75 feet Assistive device utilized: Crutches Level of assistance: Modified independence Comments: brace on; slow pattern       TODAY'S TREATMENT: 12/14/2021 Supine: glute activation with pelvic tilt; lifting alternating LE in pelvic tilt position 20x Pball roll out with LE in supine - 20x Pelvic tilt with small lift 5x Hip rotation in supine - 10x each way Elliptical: Ramp 3x2 minutes  Rt hip flexor stretch on stairs Standing: small hip hinge with buttocks on wall to cue hip movement; transversus abdominus activation cued - 10x; then 10x without wall to cue Standing small hip circles both ways 10x each way Tennis ball self massage to gluteals and lumbar against the wall Not performed today due to increased pain: Side stepping without UE support Rt and Lt 4" lateral step down 2x10- good control.  Seated march (short level hip flexion) 2x10 added 2# added  Arm bike: level 1x 6 minutes (3/3)-for endurance Sit to stand: 2x5 Leg press: seat 6, 40#  2x10 bil- verbal cues to keep hips neutral.   Sideplank on knees Rt x10, Lt isometric only due to challenge and pull on Rt hip  12/08/2021 Seated: press into foam roll for TA activation 5" hold x15 Elliptical: Ramp 3x2.5 minutes  Sit to stand: 2x5 Leg press: seat 6, 40#  2x10 bil- verbal cues to keep hips neutral.   Sideplank on knees Rt x10, Lt isometric only due to challenge and pull on Rt hip Rt hip flexor stretch off edge of bed, added knee flexion to increase the stretch Left KTC with right leg straight (for easy right hip flexor stretch) 3x Right leg slight heel dangle off edge of mat for easy hip flexor stretch 3x Bridge: partial ROM with glute contraction 2x10 Side stepping without UE support Rt  and Lt 4" lateral step down 2x10- good control.  Seated march (short level hip flexion) 2x10 added 2# added  Arm bike: level 1x 6 minutes (3/3)-for endurance   12/06/2021 Seated: press into foam roll for TA activation 5" hold x15 Attempted elliptical- too much pull on the Rt hip flexor so stopped after 1 minute  Sit to stand: 2x5 Supine assisted fig 4 with therapist supporting at knee hold 20 sec 3x Rt hip flexor stretch off edge of bed, added knee flexion to increase the stretch Left  KTC with right leg straight (for easy right hip flexor stretch) 3x Right leg slight heel dangle off edge of mat for easy hip flexor stretch 3x Heel slides 15x Side stepping at counter top Rt and Lt 4" lateral step down 2x10 Seated march (short level hip flexion) 2x10  Arm bike: level 1x 6 minutes (3/3)-for endurance Gait up and down hallway with cane: improved symmetry and weight shift post session.     PATIENT EDUCATION:  Education details: current HEP Person educated: Patient Education method: Customer service manager Education comprehension: verbalized understanding and returned demonstration     HOME EXERCISE PROGRAM: Access Code: [email or web address] URL: [email or web address] Date: 11/04/2021 Prepared by: Ruben Im   Exercises - Supine Heel Slide  - 1 x daily - 7 x weekly - 2 sets - 10 reps - Supine Hip Abduction  - 1 x daily - 7 x weekly - 1 sets - 5-10 reps - Supine Isometric Hip Adduction with Pillow at Knees  - 1 x daily - 7 x weekly - 1 sets - 5-10 reps - Prone Heel Squeeze  - 1 x daily - 7 x weekly - 1 sets - 5-10 reps As discussed   ASSESSMENT:   CLINICAL IMPRESSION: Pt presented with more pain today.  Focus was placed on gentle movements working on improved mobility.  Transversus abdominus activation was emphasized due to increased muscles spasms in back causing part of increased pain.  Pt continues to demonstrate progress overall although she was in a flare up of  pain today. PT monitored for pain and technique throughout session .  Pt will continue to benefit from skilled PT to improve strength and function following post-op protocol.  OBJECTIVE IMPAIRMENTS Abnormal gait, decreased activity tolerance, decreased mobility, difficulty walking, decreased ROM, decreased strength, impaired perceived functional ability, and pain.    ACTIVITY LIMITATIONS lifting, bending, sitting, standing, squatting, sleeping, stairs, and locomotion level   PARTICIPATION LIMITATIONS: meal prep, cleaning, laundry, driving, shopping, community activity, and occupation   PERSONAL FACTORS 1 comorbidity: previous back surgery and left hip surgery  are also affecting patient's functional outcome.    REHAB POTENTIAL: Good   CLINICAL DECISION MAKING: Stable/uncomplicated   EVALUATION COMPLEXITY: Low     GOALS: Goals reviewed with patient? Yes   SHORT TERM GOALS: Target date: 12/14/2021 The patient will demonstrate knowledge of basic self care strategies and exercises to promote healing   Baseline: Goal status: Met (11/09/21)   2.  The patient will report a 50% improvement in pain levels with functional activities which are currently difficult including bending over, sitting for work Baseline:  Goal status:Met   3.  The patient will have full hip ROM needed for basic ADLs  Baseline:  Goal status: in progress    4.  The patient will be able to full weight bear and ambulate household and short community distances with ease Baseline: 1 crutch now (11/18/21) Goal status: in progress    LONG TERM GOALS: Target date: 01/25/2022   The patient will be independent in a safe self progression of a home exercise program to promote further recovery of function   Baseline:  Goal status: In progress    2.  The patient will report a 75% improvement in pain levels with functional activities which are currently difficult including  Baseline:  Goal status: INITIAL   3.  Hip flexion,  abduction, adduction and rotation strength grossly 4/5 or within 75% of noninvolved side Baseline:  Goal status: INITIAL   4.  The patient will have returned to the majority of home and work ADLs including traveling for work Baseline:  Goal status: INITIAL   5.  FOTO score improved to 56%  Baseline:  Goal status: INITIAL         PLAN: PT FREQUENCY: 2-3x/week   PT DURATION: 12 weeks   PLANNED INTERVENTIONS: Therapeutic exercises, Therapeutic activity, Neuromuscular re-education, Patient/Family education, Joint mobilization, Aquatic Therapy,  Electrical stimulation, Cryotherapy, Taping, Ultrasound, Ionotophoresis 77m/ml Dexamethasone, and Manual therapy   PLAN FOR NEXT SESSION: per protocol date of surgery 6/15;  work on sideplank on knees and bridges.   JGustavus Bryant PT 12/15/21 7:48 AM   Phone: [phone]: [phone]

## 2021-12-15 ENCOUNTER — Encounter: Payer: Self-pay | Admitting: Physical Therapy

## 2021-12-16 ENCOUNTER — Encounter: Payer: Self-pay | Admitting: Rehabilitative and Restorative Service Providers"

## 2021-12-16 ENCOUNTER — Ambulatory Visit: Payer: No Typology Code available for payment source | Admitting: Physical Therapy

## 2021-12-16 ENCOUNTER — Ambulatory Visit: Payer: No Typology Code available for payment source | Admitting: Rehabilitative and Restorative Service Providers"

## 2021-12-16 DIAGNOSIS — R262 Difficulty in walking, not elsewhere classified: Secondary | ICD-10-CM

## 2021-12-16 DIAGNOSIS — M6281 Muscle weakness (generalized): Secondary | ICD-10-CM

## 2021-12-16 DIAGNOSIS — M25551 Pain in right hip: Secondary | ICD-10-CM | POA: Diagnosis not present

## 2021-12-16 NOTE — Therapy (Signed)
OUTPATIENT PHYSICAL THERAPY TREATMENT NOTE   Patient Name: Kelsey [name] MRN: [id number] DOB:28-Apr-1966, 56 y.o., female Today's Date: 12/16/2021   REFERRING PROVIDER: Dr. Aretha Parrot  END OF SESSION:   PT End of Session - 12/16/21 0848     Visit Number 15    Date for PT Re-Evaluation 01/25/22    Authorization Type [employer] 40 visits    Authorization - Visit Number 15    Authorization - Number of Visits 40    PT Start Time 0845    PT Stop Time 0925    PT Time Calculation (min) 40 min    Activity Tolerance Patient tolerated treatment well    Behavior During Therapy [facility] for tasks assessed/performed                Past Medical History:  Diagnosis Date   Abnormal Pap smear    Bulging lumbar disc    L4, l5--scheduled for surgery 05-04-18--Dr. Elsner   Hidradenitis    Hyperlipidemia    Infertility, female    no method of birth from late 20's through the 40's,  Headaches on OCP   Migraine    with aura   Sleep apnea 2021   Past Surgical History:  Procedure Laterality Date   ABDOMINAL EXPOSURE N/A 05/04/2018   Procedure: ABDOMINAL EXPOSURE;  Surgeon: Marty Heck, MD;  Location: [place];  Service: Vascular;  Laterality: N/A;   ANKLE SURGERY  1998/1999   left ankle fracture with ORIF   ANTERIOR LUMBAR FUSION N/A 05/04/2018   Procedure: Lumbar Four-Five Anterior  lumbar interbody fusion;  Surgeon: Kristeen Miss, MD;  Location: [place];  Service: Neurosurgery;  Laterality: N/A;  anterior   CARPAL TUNNEL RELEASE  05/21/12   right wrist   COLPOSCOPY     CIN I   HIP SURGERY Left 03-06-13   --[place] from injury   [place]  2000   off back - benign   Patient Active Problem List   Diagnosis Date Noted   Spondylolisthesis of lumbar region 05/04/2018   Degenerative disc disease, lumbar 04/17/2018   History of migraine headaches 01/28/2013   REFERRING DIAG: right hip pain M25.551   THERAPY DIAG:  right hip pain; weakness       Rationale  for Evaluation and Treatment Rehabilitation   PERTINENT HISTORY:   Right hip arthroscopic surgery October 28, 2021  see protocol  next MD follow up 7/18   OPERATION PERFORMED: 1. Right hip arthroscopy (70786) [9.00]. 2. Synovectomy of central compartment (75449) [11.17]. 3. Synovectomy of peripheral compartment (20100) [11.17]. 4. Acetabuloplasty (71219) [15.00]. 5. Labral repair (75883) [15.00]. 6. Chondroplasty of acetabulum (25498) [11.17]. 7. Chondroplasty of femoral head (26415) [11.17]. 8. Femoroplasty (83094) [14.67]. 9. Debridement of ligamentum teres (07680) [17.37].     PRECAUTIONS:  precautions per protocol:   20 pounds foot flat partial weight bearing x 4 weeks (specific patient instructions say 2 weeks) Wean from crutches in brace weeks 3-4 Hip brace 0-90 degrees x 10 days then full ROM Brace wear 1-6 weeks as indicated by muscle control and comfort ROM limits: flexion 90 degrees x10 days, extension gentle x3 weeks; external rotation gentle x3 weeks; Internal rotation no limits; adduction <10 x6 weeks No DN   SUBJECTIVE:    Pt reports feeling better today, but still having some pain.  PAIN:  Are you having pain? Yes NPRS scale: 3/10 Pain location: right anterior hip and thigh, HS soreness  Aggravating  with standing and walking    OBJECTIVE: (objective measures completed at initial evaluation unless otherwise dated)   DIAGNOSTIC FINDINGS: labral tear   PATIENT SURVEYS:  FOTO 35% baseline       LOWER EXTREMITY ROM: limited per surgical precautions   passive ROM Right eval Left eval  Hip flexion 80 WFLs  Hip extension 0 WFLs  Hip abduction 5 WFLs  Hip adduction   WFLs  Hip internal rotation 10 WFLs  Hip external rotation   WFLs  Knee flexion      Knee extension      Ankle dorsiflexion      Ankle plantarflexion      Ankle inversion      Ankle eversion       (Blank rows = not tested)   LOWER EXTREMITY MMT:  not tested secondary to surgical  precautions; able to do quad set, glute set and abdominal brace     GAIT: Distance walked: 75 feet Assistive device utilized: Crutches Level of assistance: Modified independence Comments: brace on; slow pattern  FUNCTIONAL ASSESSMENTS: 12/16/2021: 3 Minute Walk Test:  450 ft with SPC 5 times sit to/from stand:  13.5 sec with hands on thighs     TODAY'S TREATMENT: 12/16/2021: Seated hip marches 2x20 Sideplank on knees Rt x10, Lt isometric only due to challenge and pull on Rt hip Bilateral SKTC stretch with opposite leg outstretched 3x20 sec Supine: glute activation with pelvic tilt; lifting alternating LE in pelvic tilt position 20x Bridge: partial ROM with glute contraction 2x10 Sit to stand: 2x10 3 minute walk test and 5 times sit to/from stand Standing small hip circles both ways 10x each way Negotiating 6" steps with reciprocal pattern with UE support x3 4" lateral step down 2x10- good control.   12/14/2021 Supine: glute activation with pelvic tilt; lifting alternating LE in pelvic tilt position 20x Pball roll out with LE in supine - 20x Pelvic tilt with small lift 5x Hip rotation in supine - 10x each way Elliptical: Ramp 3x2 minutes  Rt hip flexor stretch on stairs Standing: small hip hinge with buttocks on wall to cue hip movement; transversus abdominus activation cued - 10x; then 10x without wall to cue Standing small hip circles both ways 10x each way Tennis ball self massage to gluteals and lumbar against the wall Not performed today due to increased pain: Side stepping without UE support Rt and Lt 4" lateral step down 2x10- good control.  Seated march (short level hip flexion) 2x10 added 2# added  Arm bike: level 1x 6 minutes (3/3)-for endurance Sit to stand: 2x5 Leg press: seat 6, 40#  2x10 bil- verbal cues to keep hips neutral.   Sideplank on knees Rt x10, Lt isometric only due to challenge and pull on Rt hip  12/08/2021 Seated: press into foam roll for TA  activation 5" hold x15 Elliptical: Ramp 3x2.5 minutes  Sit to stand: 2x5 Leg press: seat 6, 40#  2x10 bil- verbal cues to keep hips neutral.   Sideplank on knees Rt x10, Lt isometric only due to challenge and pull on Rt hip Rt hip flexor stretch off edge of bed, added knee flexion to increase the stretch Left KTC with right leg straight (for easy right hip flexor stretch) 3x Right leg slight heel dangle off edge of mat for easy hip flexor stretch 3x Bridge: partial ROM with glute contraction 2x10 Side stepping without UE support Rt and Lt 4" lateral step down 2x10- good control.  Seated march (short  level hip flexion) 2x10 added 2# added  Arm bike: level 1x 6 minutes (3/3)-for endurance    PATIENT EDUCATION:  Education details: current HEP Person educated: Patient Education method: Customer service manager Education comprehension: verbalized understanding and returned demonstration     HOME EXERCISE PROGRAM: Access Code: [email or web address] URL: [email or web address] Date: 11/04/2021 Prepared by: Ruben Im   Exercises - Supine Heel Slide  - 1 x daily - 7 x weekly - 2 sets - 10 reps - Supine Hip Abduction  - 1 x daily - 7 x weekly - 1 sets - 5-10 reps - Supine Isometric Hip Adduction with Pillow at Knees  - 1 x daily - 7 x weekly - 1 sets - 5-10 reps - Prone Heel Squeeze  - 1 x daily - 7 x weekly - 1 sets - 5-10 reps As discussed   ASSESSMENT:   CLINICAL IMPRESSION: Ms Tetzlaff presents to skilled rehabilitation reporting that she is feeling better than last session.  Pt was able to participate in functional objective measures of 3 minute walk test and 5 times sit to stand assessment.  Pt with improved weight bearing noted during ambulation.  Pt continues to progress with goal related activities and continues to require skilled PT.  OBJECTIVE IMPAIRMENTS Abnormal gait, decreased activity tolerance, decreased mobility, difficulty walking, decreased ROM, decreased  strength, impaired perceived functional ability, and pain.    ACTIVITY LIMITATIONS lifting, bending, sitting, standing, squatting, sleeping, stairs, and locomotion level   PARTICIPATION LIMITATIONS: meal prep, cleaning, laundry, driving, shopping, community activity, and occupation   PERSONAL FACTORS 1 comorbidity: previous back surgery and left hip surgery  are also affecting patient's functional outcome.    REHAB POTENTIAL: Good   CLINICAL DECISION MAKING: Stable/uncomplicated   EVALUATION COMPLEXITY: Low     GOALS: Goals reviewed with patient? Yes   SHORT TERM GOALS: Target date: 12/14/2021 The patient will demonstrate knowledge of basic self care strategies and exercises to promote healing   Baseline: Goal status: Met (11/09/21)   2.  The patient will report a 50% improvement in pain levels with functional activities which are currently difficult including bending over, sitting for work Baseline:  Goal status:Met   3.  The patient will have full hip ROM needed for basic ADLs  Baseline:  Goal status: in progress    4.  The patient will be able to full weight bear and ambulate household and short community distances with ease Baseline: 1 crutch now (11/18/21) Goal status: Goal Met 12/16/2021 and able to complete 3 min walk test   LONG TERM GOALS: Target date: 01/25/2022   The patient will be independent in a safe self progression of a home exercise program to promote further recovery of function   Baseline:  Goal status: In progress    2.  The patient will report a 75% improvement in pain levels with functional activities which are currently difficult including  Baseline:  Goal status: INITIAL   3.  Hip flexion, abduction, adduction and rotation strength grossly 4/5 or within 75% of noninvolved side Baseline:  Goal status: INITIAL   4.  The patient will have returned to the majority of home and work ADLs including traveling for work Baseline:  Goal status: INITIAL   5.   FOTO score improved to 56%  Baseline:  Goal status: INITIAL         PLAN: PT FREQUENCY: 2-3x/week   PT DURATION: 12 weeks   PLANNED INTERVENTIONS: Therapeutic exercises, Therapeutic activity, Neuromuscular re-education, Patient/Family education,  Joint mobilization, Aquatic Therapy,  Electrical stimulation, Cryotherapy, Taping, Ultrasound, Ionotophoresis 103m/ml Dexamethasone, and Manual therapy   PLAN FOR NEXT SESSION: per protocol date of surgery 6/15;  work on sideplank on knees and bridges.    SJuel Burrow PT 12/16/21 9:31 AM  [facility] [address] [place]  [id number] # [phone] [phone]

## 2021-12-21 ENCOUNTER — Ambulatory Visit: Payer: No Typology Code available for payment source | Admitting: Physical Therapy

## 2021-12-21 DIAGNOSIS — M6281 Muscle weakness (generalized): Secondary | ICD-10-CM

## 2021-12-21 DIAGNOSIS — M25551 Pain in right hip: Secondary | ICD-10-CM | POA: Diagnosis not present

## 2021-12-21 DIAGNOSIS — R262 Difficulty in walking, not elsewhere classified: Secondary | ICD-10-CM

## 2021-12-21 NOTE — Therapy (Signed)
OUTPATIENT PHYSICAL THERAPY TREATMENT NOTE   Patient Name: Kelsey [name] MRN: [id number] DOB:12/04/1965, 56 y.o., female Today's Date: 12/21/2021   REFERRING PROVIDER: Dr. Aretha Parrot  END OF SESSION:   PT End of Session - 12/21/21 0756     Visit Number 16    Date for PT Re-Evaluation 01/25/22    Authorization Type A[employer] 40 visits    Authorization - Visit Number 16    Authorization - Number of Visits 40    PT Start Time 0800    PT Stop Time 0840    PT Time Calculation (min) 40 min    Activity Tolerance Patient tolerated treatment well                Past Medical History:  Diagnosis Date   Abnormal Pap smear    Bulging lumbar disc    L4, l5--scheduled for surgery 05-04-18--Dr. Elsner   Hidradenitis    Hyperlipidemia    Infertility, female    no method of birth from late 20's through the 40's,  Headaches on OCP   Migraine    with aura   Sleep apnea 2021   Past Surgical History:  Procedure Laterality Date   ABDOMINAL EXPOSURE N/A 05/04/2018   Procedure: ABDOMINAL EXPOSURE;  Surgeon: Marty Heck, MD;  Location: [place];  Service: Vascular;  Laterality: N/A;   ANKLE SURGERY  1998/1999   left ankle fracture with ORIF   ANTERIOR LUMBAR FUSION N/A 05/04/2018   Procedure: Lumbar Four-Five Anterior  lumbar interbody fusion;  Surgeon: Kristeen Miss, MD;  Location: [place];  Service: Neurosurgery;  Laterality: N/A;  anterior   CARPAL TUNNEL RELEASE  05/21/12   right wrist   COLPOSCOPY     CIN I   HIP SURGERY Left 03-06-13   --[place] from injury   [place]  2000   off back - benign   Patient Active Problem List   Diagnosis Date Noted   Spondylolisthesis of lumbar region 05/04/2018   Degenerative disc disease, lumbar 04/17/2018   History of migraine headaches 01/28/2013   REFERRING DIAG: right hip pain M25.551   THERAPY DIAG:  right hip pain; weakness       Rationale for Evaluation and Treatment Rehabilitation   PERTINENT  HISTORY:   Right hip arthroscopic surgery October 28, 2021  see protocol  next MD follow up 7/18   OPERATION PERFORMED: 1. Right hip arthroscopy (15176) [9.00]. 2. Synovectomy of central compartment (16073) [11.17]. 3. Synovectomy of peripheral compartment (71062) [11.17]. 4. Acetabuloplasty (69485) [15.00]. 5. Labral repair (46270) [15.00]. 6. Chondroplasty of acetabulum (35009) [11.17]. 7. Chondroplasty of femoral head (38182) [11.17]. 8. Femoroplasty (99371) [14.67]. 9. Debridement of ligamentum teres (69678) [17.37].     PRECAUTIONS:  precautions per protocol:   20 pounds foot flat partial weight bearing x 4 weeks (specific patient instructions say 2 weeks) Wean from crutches in brace weeks 3-4 Hip brace 0-90 degrees x 10 days then full ROM Brace wear 1-6 weeks as indicated by muscle control and comfort ROM limits: flexion 90 degrees x10 days, extension gentle x3 weeks; external rotation gentle x3 weeks; Internal rotation no limits; adduction <10 x6 weeks No DN   SUBJECTIVE:    Active on Saturday.  Using SPC.  Front thigh feels nervey.  Can do 2-3 steps with right leg leading.  Going down stairs well.  Side planks are difficult as well as bridges.  Using TENs unit at home.  By  afternoon it's really tired.  My problem is my low back.   PAIN:  Are you having pain? Yes NPRS scale: 3/10 Pain location: right anterior hip and thigh, HS soreness  Aggravating with standing and walking    OBJECTIVE: (objective measures completed at initial evaluation unless otherwise dated)   DIAGNOSTIC FINDINGS: labral tear   PATIENT SURVEYS:  FOTO 35% baseline       LOWER EXTREMITY ROM: limited per surgical precautions   passive ROM Right eval Left eval  Hip flexion 80 WFLs  Hip extension 0 WFLs  Hip abduction 5 WFLs  Hip adduction   WFLs  Hip internal rotation 10 WFLs  Hip external rotation   WFLs  Knee flexion      Knee extension      Ankle dorsiflexion      Ankle plantarflexion       Ankle inversion      Ankle eversion       (Blank rows = not tested)   LOWER EXTREMITY MMT:  not tested secondary to surgical precautions; able to do quad set, glute set and abdominal brace     GAIT: Distance walked: 75 feet Assistive device utilized: Crutches Level of assistance: Modified independence Comments: brace on; slow pattern  FUNCTIONAL ASSESSMENTS: 12/16/2021: 3 Minute Walk Test:  450 ft with SPC 5 times sit to/from stand:  13.5 sec with hands on thighs     TODAY'S TREATMENT: 8/8: Sidestepping at the bar 5 laps  Standing foot on stool hip internal rotation 20x Mini squats at the bar 15x WB on right LE with step taps 15x Leg press seat 7  bil 60# 25x  4" lateral step down 2x10- good control.  Standing floor slider on right hip abduction 15x Supine with LEs on green ball  Supine with right piriformis stretch 15x Supine bridge with LEs on green ball 15x Manual long axis distraction, prone PA, PA in slight internal and external rotation grade 2/3 3x 30 sec each    12/16/2021: Seated hip marches 2x20 Sideplank on knees Rt x10, Lt isometric only due to challenge and pull on Rt hip Bilateral SKTC stretch with opposite leg outstretched 3x20 sec Supine: glute activation with pelvic tilt; lifting alternating LE in pelvic tilt position 20x Bridge: partial ROM with glute contraction 2x10 Sit to stand: 2x10 3 minute walk test and 5 times sit to/from stand Standing small hip circles both ways 10x each way Negotiating 6" steps with reciprocal pattern with UE support x3 4" lateral step down 2x10- good control.   12/14/2021 Supine: glute activation with pelvic tilt; lifting alternating LE in pelvic tilt position 20x Pball roll out with LE in supine - 20x Pelvic tilt with small lift 5x Hip rotation in supine - 10x each way Elliptical: Ramp 3x2 minutes  Rt hip flexor stretch on stairs Standing: small hip hinge with buttocks on wall to cue hip movement; transversus abdominus  activation cued - 10x; then 10x without wall to cue Standing small hip circles both ways 10x each way Tennis ball self massage to gluteals and lumbar against the wall Not performed today due to increased pain: Side stepping without UE support Rt and Lt 4" lateral step down 2x10- good control.  Seated march (short level hip flexion) 2x10 added 2# added  Arm bike: level 1x 6 minutes (3/3)-for endurance Sit to stand: 2x5 Leg press: seat 6, 40#  2x10 bil- verbal cues to keep hips neutral.   Sideplank on knees Rt x10, Lt isometric  only due to challenge and pull on Rt hip     PATIENT EDUCATION:  Education details: current HEP Person educated: Patient Education method: Customer service manager Education comprehension: verbalized understanding and returned demonstration     HOME EXERCISE PROGRAM: Access Code: [email or web address] URL: [email or web address] Date: 11/04/2021 Prepared by: Ruben Im   Exercises - Supine Heel Slide  - 1 x daily - 7 x weekly - 2 sets - 10 reps - Supine Hip Abduction  - 1 x daily - 7 x weekly - 1 sets - 5-10 reps - Supine Isometric Hip Adduction with Pillow at Knees  - 1 x daily - 7 x weekly - 1 sets - 5-10 reps - Prone Heel Squeeze  - 1 x daily - 7 x weekly - 1 sets - 5-10 reps As discussed   ASSESSMENT:   CLINICAL IMPRESSION: LBP flare up has slowed progress over the last few weeks however she has been able to return to more of her protocol exercises in the last week.  Good response to the leg press and standing weight bearing ex's although they were challenging.  She reports great difficulty performing a traditional bridge but able to bridge with LEs on ball with greater ease.  Therapist monitoring response throughout session.    OBJECTIVE IMPAIRMENTS Abnormal gait, decreased activity tolerance, decreased mobility, difficulty walking, decreased ROM, decreased strength, impaired perceived functional ability, and pain.    ACTIVITY LIMITATIONS  lifting, bending, sitting, standing, squatting, sleeping, stairs, and locomotion level   PARTICIPATION LIMITATIONS: meal prep, cleaning, laundry, driving, shopping, community activity, and occupation   PERSONAL FACTORS 1 comorbidity: previous back surgery and left hip surgery  are also affecting patient's functional outcome.    REHAB POTENTIAL: Good   CLINICAL DECISION MAKING: Stable/uncomplicated   EVALUATION COMPLEXITY: Low     GOALS: Goals reviewed with patient? Yes   SHORT TERM GOALS: Target date: 12/14/2021 The patient will demonstrate knowledge of basic self care strategies and exercises to promote healing   Baseline: Goal status: Met (11/09/21)   2.  The patient will report a 50% improvement in pain levels with functional activities which are currently difficult including bending over, sitting for work Baseline:  Goal status:Met   3.  The patient will have full hip ROM needed for basic ADLs  Baseline:  Goal status: in progress    4.  The patient will be able to full weight bear and ambulate household and short community distances with ease Baseline: 1 crutch now (11/18/21) Goal status: Goal Met 12/16/2021 and able to complete 3 min walk test   LONG TERM GOALS: Target date: 01/25/2022   The patient will be independent in a safe self progression of a home exercise program to promote further recovery of function   Baseline:  Goal status: In progress    2.  The patient will report a 75% improvement in pain levels with functional activities which are currently difficult including  Baseline:  Goal status: INITIAL   3.  Hip flexion, abduction, adduction and rotation strength grossly 4/5 or within 75% of noninvolved side Baseline:  Goal status: INITIAL   4.  The patient will have returned to the majority of home and work ADLs including traveling for work Baseline:  Goal status: INITIAL   5.  FOTO score improved to 56%  Baseline:  Goal status: INITIAL         PLAN: PT  FREQUENCY: 2-3x/week   PT DURATION: 12 weeks   PLANNED INTERVENTIONS: Therapeutic exercises, Therapeutic  activity, Neuromuscular re-education, Patient/Family education, Joint mobilization, Aquatic Therapy,  Electrical stimulation, Cryotherapy, Taping, Ultrasound, Ionotophoresis 80m/ml Dexamethasone, and Manual therapy   PLAN FOR NEXT SESSION: per protocol date of surgery 6/15;  work on sideplank on knees and bridges (LEs on ball).   SRuben Im PT 12/21/21 5:53 PM Phone: [phone]: [phone]  [facility] [place] [place] [address]Phone # [phone] [phone]

## 2021-12-23 ENCOUNTER — Ambulatory Visit: Payer: No Typology Code available for payment source

## 2021-12-23 DIAGNOSIS — M25551 Pain in right hip: Secondary | ICD-10-CM

## 2021-12-23 DIAGNOSIS — M6281 Muscle weakness (generalized): Secondary | ICD-10-CM

## 2021-12-23 DIAGNOSIS — R262 Difficulty in walking, not elsewhere classified: Secondary | ICD-10-CM

## 2021-12-23 NOTE — Therapy (Signed)
OUTPATIENT PHYSICAL THERAPY TREATMENT NOTE   Patient Name: Kelsey [name] MRN: [id number] DOB:[birth year], 56 y.o., female Today's Date: 12/23/2021   REFERRING PROVIDER: Dr. Aretha Parrot  END OF SESSION:   PT End of Session - 12/23/21 0844     Visit Number 17    Date for PT Re-Evaluation 01/25/22    Authorization Type [employer] 40 visits    Authorization - Visit Number 82    Authorization - Number of Visits 40    PT Start Time 0803    PT Stop Time 0845    PT Time Calculation (min) 42 min    Activity Tolerance Patient tolerated treatment well    Behavior During Therapy [facility] for tasks assessed/performed                 Past Medical History:  Diagnosis Date   Abnormal Pap smear    Bulging lumbar disc    L4, l5--scheduled for surgery 05-04-18--Dr. Elsner   Hidradenitis    Hyperlipidemia    Infertility, female    no method of birth from late 20's through the 40's,  Headaches on OCP   Migraine    with aura   Sleep apnea 2021   Past Surgical History:  Procedure Laterality Date   ABDOMINAL EXPOSURE N/A 05/04/2018   Procedure: ABDOMINAL EXPOSURE;  Surgeon: Marty Heck, MD;  Location: [place];  Service: Vascular;  Laterality: N/A;   ANKLE SURGERY  1998/1999   left ankle fracture with ORIF   ANTERIOR LUMBAR FUSION N/A 05/04/2018   Procedure: Lumbar Four-Five Anterior  lumbar interbody fusion;  Surgeon: Kristeen Miss, MD;  Location: [place];  Service: Neurosurgery;  Laterality: N/A;  anterior   CARPAL TUNNEL RELEASE  05/21/12   right wrist   COLPOSCOPY     CIN I   HIP SURGERY Left 03-06-13   --[place] from injury   [place]  2000   off back - benign   Patient Active Problem List   Diagnosis Date Noted   Spondylolisthesis of lumbar region 05/04/2018   Degenerative disc disease, lumbar 04/17/2018   History of migraine headaches 01/28/2013   REFERRING DIAG: right hip pain M25.551   THERAPY DIAG:  right hip pain; weakness        Rationale for Evaluation and Treatment Rehabilitation   PERTINENT HISTORY:   Right hip arthroscopic surgery October 28, 2021  see protocol  next MD follow up 7/18   OPERATION PERFORMED: 1. Right hip arthroscopy (19622) [9.00]. 2. Synovectomy of central compartment (29798) [11.17]. 3. Synovectomy of peripheral compartment (92119) [11.17]. 4. Acetabuloplasty (41740) [15.00]. 5. Labral repair (81448) [15.00]. 6. Chondroplasty of acetabulum (18563) [11.17]. 7. Chondroplasty of femoral head (14970) [11.17]. 8. Femoroplasty (26378) [14.67]. 9. Debridement of ligamentum teres (58850) [17.37].     PRECAUTIONS:  precautions per protocol:   20 pounds foot flat partial weight bearing x 4 weeks (specific patient instructions say 2 weeks) Wean from crutches in brace weeks 3-4 Hip brace 0-90 degrees x 10 days then full ROM Brace wear 1-6 weeks as indicated by muscle control and comfort ROM limits: flexion 90 degrees x10 days, extension gentle x3 weeks; external rotation gentle x3 weeks; Internal rotation no limits; adduction <10 x6 weeks No DN   SUBJECTIVE:    I was able to walk up and down the steps without pain.  That is a first for me.   PAIN:  Are you having pain? Yes NPRS scale: 3/10  Pain location: right anterior hip and thigh, HS soreness  Aggravating with standing and walking    OBJECTIVE: (objective measures completed at initial evaluation unless otherwise dated)   DIAGNOSTIC FINDINGS: labral tear   PATIENT SURVEYS:  FOTO 35% baseline       LOWER EXTREMITY ROM: limited per surgical precautions   passive ROM Right eval Left eval  Hip flexion 80 WFLs  Hip extension 0 WFLs  Hip abduction 5 WFLs  Hip adduction   WFLs  Hip internal rotation 10 WFLs  Hip external rotation   WFLs  Knee flexion      Knee extension      Ankle dorsiflexion      Ankle plantarflexion      Ankle inversion      Ankle eversion       (Blank rows = not tested)   LOWER EXTREMITY MMT:  not tested  secondary to surgical precautions; able to do quad set, glute set and abdominal brace     GAIT: Distance walked: 75 feet Assistive device utilized: Crutches Level of assistance: Modified independence Comments: brace on; slow pattern  FUNCTIONAL ASSESSMENTS: 12/16/2021: 3 Minute Walk Test:  450 ft with SPC 5 times sit to/from stand:  13.5 sec with hands on thighs     TODAY'S TREATMENT:  8/10: Sidestepping at the bar 5 laps  Standing foot on stool hip internal rotation 20x Mini squats at the bar 2x10 Standing on Rt LE with tap to cone on the floor with Lt 2x10 WB on right LE with step taps 15x Leg press seat 7  bil 60# 25x  4" lateral step down 2x10- good control.  Standing floor slider on right hip abduction 15x Supine with LEs on green ball-LE flexion/extension Supine with right piriformis stretch 15x Supine bridge with LEs on green ball 15x Stepping forward over hurdles x4 laps Manual prone IR/ER    12/16/2021: Seated hip marches 2x20 Sideplank on knees Rt x10, Lt isometric only due to challenge and pull on Rt hip Bilateral SKTC stretch with opposite leg outstretched 3x20 sec Supine: glute activation with pelvic tilt; lifting alternating LE in pelvic tilt position 20x Bridge: partial ROM with glute contraction 2x10 Sit to stand: 2x10 3 minute walk test and 5 times sit to/from stand Standing small hip circles both ways 10x each way Negotiating 6" steps with reciprocal pattern with UE support x3 4" lateral step down 2x10- good control.   12/14/2021 Supine: glute activation with pelvic tilt; lifting alternating LE in pelvic tilt position 20x Pball roll out with LE in supine - 20x Pelvic tilt with small lift 5x Hip rotation in supine - 10x each way Elliptical: Ramp 3x2 minutes  Rt hip flexor stretch on stairs Standing: small hip hinge with buttocks on wall to cue hip movement; transversus abdominus activation cued - 10x; then 10x without wall to cue Standing small hip  circles both ways 10x each way Tennis ball self massage to gluteals and lumbar against the wall Not performed today due to increased pain: Side stepping without UE support Rt and Lt 4" lateral step down 2x10- good control.  Seated march (short level hip flexion) 2x10 added 2# added  Arm bike: level 1x 6 minutes (3/3)-for endurance Sit to stand: 2x5 Leg press: seat 6, 40#  2x10 bil- verbal cues to keep hips neutral.   Sideplank on knees Rt x10, Lt isometric only due to challenge and pull on Rt hip     PATIENT EDUCATION:  Education details:  current HEP Person educated: Patient Education method: Customer service manager Education comprehension: verbalized understanding and returned demonstration     HOME EXERCISE PROGRAM: Access Code: [email or web address] URL: [email or web address] Date: 11/04/2021 Prepared by: Ruben Im   Exercises - Supine Heel Slide  - 1 x daily - 7 x weekly - 2 sets - 10 reps - Supine Hip Abduction  - 1 x daily - 7 x weekly - 1 sets - 5-10 reps - Supine Isometric Hip Adduction with Pillow at Knees  - 1 x daily - 7 x weekly - 1 sets - 5-10 reps - Prone Heel Squeeze  - 1 x daily - 7 x weekly - 1 sets - 5-10 reps As discussed   ASSESSMENT:   CLINICAL IMPRESSION: Pt is making steady progress while following protocol.  Pt is using cane minimally and demonstrates good symmetry without device.  She is using the cane when fatigued at the end of the day to support her low back.  Pt was able ascend and descend steps without pain when leading up with the Rt.  Pt demonstrates good pelvic alignment with single leg stability exercise on the Rt. Therapist monitoring response throughout session.    OBJECTIVE IMPAIRMENTS Abnormal gait, decreased activity tolerance, decreased mobility, difficulty walking, decreased ROM, decreased strength, impaired perceived functional ability, and pain.    ACTIVITY LIMITATIONS lifting, bending, sitting, standing, squatting,  sleeping, stairs, and locomotion level   PARTICIPATION LIMITATIONS: meal prep, cleaning, laundry, driving, shopping, community activity, and occupation   PERSONAL FACTORS 1 comorbidity: previous back surgery and left hip surgery  are also affecting patient's functional outcome.    REHAB POTENTIAL: Good   CLINICAL DECISION MAKING: Stable/uncomplicated   EVALUATION COMPLEXITY: Low     GOALS: Goals reviewed with patient? Yes   SHORT TERM GOALS: Target date: 12/14/2021 The patient will demonstrate knowledge of basic self care strategies and exercises to promote healing   Baseline: Goal status: Met (11/09/21)   2.  The patient will report a 50% improvement in pain levels with functional activities which are currently difficult including bending over, sitting for work Baseline: 65% better (12/23/21) Goal status:Met    3.  The patient will have full hip ROM needed for basic ADLs  Baseline:  Goal status: in progress    4.  The patient will be able to full weight bear and ambulate household and short community distances with ease Baseline: 1 crutch now (11/18/21) Goal status: Goal Met 12/16/2021 and able to complete 3 min walk test   LONG TERM GOALS: Target date: 01/25/2022   The patient will be independent in a safe self progression of a home exercise program to promote further recovery of function   Baseline:  Goal status: In progress    2.  The patient will report a 75% improvement in pain levels with functional activities which are currently difficult including  Baseline: 65% (12/23/21) Goal status: In progress    3.  Hip flexion, abduction, adduction and rotation strength grossly 4/5 or within 75% of noninvolved side Baseline:  Goal status: INITIAL   4.  The patient will have returned to the majority of home and work ADLs including traveling for work Baseline:  Goal status: In progress    5.  FOTO score improved to 56%  Baseline:  Goal status: INITIAL         PLAN: PT  FREQUENCY: 2-3x/week   PT DURATION: 12 weeks   PLANNED INTERVENTIONS: Therapeutic exercises, Therapeutic activity, Neuromuscular re-education, Patient/Family education, Joint  mobilization, Aquatic Therapy,  Electrical stimulation, Cryotherapy, Taping, Ultrasound, Ionotophoresis 82m/ml Dexamethasone, and Manual therapy   PLAN FOR NEXT SESSION: per protocol date of surgery 6/15;  work on sideplank on knees and bridges (LEs on ball).   KSigurd Sos PT 12/23/21 8:45 AM   [facility] [address] [place] [address]Phone # [phone] [phone]

## 2021-12-27 ENCOUNTER — Ambulatory Visit: Payer: No Typology Code available for payment source

## 2021-12-27 DIAGNOSIS — M25551 Pain in right hip: Secondary | ICD-10-CM | POA: Diagnosis not present

## 2021-12-27 DIAGNOSIS — R262 Difficulty in walking, not elsewhere classified: Secondary | ICD-10-CM

## 2021-12-27 DIAGNOSIS — M6281 Muscle weakness (generalized): Secondary | ICD-10-CM

## 2021-12-27 NOTE — Therapy (Signed)
OUTPATIENT PHYSICAL THERAPY TREATMENT NOTE   Patient Name: Kelsey [name] MRN: [id number] DOB:1966-04-28, 56 y.o., female Today's Date: 12/27/2021   REFERRING PROVIDER: Dr. Aretha Parrot  END OF SESSION:   PT End of Session - 12/27/21 0845     Visit Number 18    Date for PT Re-Evaluation 01/25/22    Authorization Type [employer] 40 visits    Authorization - Visit Number 18    Authorization - Number of Visits 40    PT Start Time 0802    PT Stop Time 0845    PT Time Calculation (min) 43 min    Activity Tolerance Patient tolerated treatment well    Behavior During Therapy [facility] for tasks assessed/performed                  Past Medical History:  Diagnosis Date   Abnormal Pap smear    Bulging lumbar disc    L4, l5--scheduled for surgery 05-04-18--Dr. Elsner   Hidradenitis    Hyperlipidemia    Infertility, female    no method of birth from late 20's through the 40's,  Headaches on OCP   Migraine    with aura   Sleep apnea 2021   Past Surgical History:  Procedure Laterality Date   ABDOMINAL EXPOSURE N/A 05/04/2018   Procedure: ABDOMINAL EXPOSURE;  Surgeon: Marty Heck, MD;  Location: [place];  Service: Vascular;  Laterality: N/A;   ANKLE SURGERY  1998/1999   left ankle fracture with ORIF   ANTERIOR LUMBAR FUSION N/A 05/04/2018   Procedure: Lumbar Four-Five Anterior  lumbar interbody fusion;  Surgeon: Kristeen Miss, MD;  Location: [place];  Service: Neurosurgery;  Laterality: N/A;  anterior   CARPAL TUNNEL RELEASE  05/21/12   right wrist   COLPOSCOPY     CIN I   HIP SURGERY Left 03-06-13   --[place] from injury   [place]  2000   off back - benign   Patient Active Problem List   Diagnosis Date Noted   Spondylolisthesis of lumbar region 05/04/2018   Degenerative disc disease, lumbar 04/17/2018   History of migraine headaches 01/28/2013   REFERRING DIAG: right hip pain M25.551   THERAPY DIAG:  right hip pain; weakness        Rationale for Evaluation and Treatment Rehabilitation   PERTINENT HISTORY:   Right hip arthroscopic surgery October 28, 2021  see protocol  next MD follow up 7/18   OPERATION PERFORMED: 1. Right hip arthroscopy (51884) [9.00]. 2. Synovectomy of central compartment (16606) [11.17]. 3. Synovectomy of peripheral compartment (30160) [11.17]. 4. Acetabuloplasty (10932) [15.00]. 5. Labral repair (35573) [15.00]. 6. Chondroplasty of acetabulum (22025) [11.17]. 7. Chondroplasty of femoral head (42706) [11.17]. 8. Femoroplasty (23762) [14.67]. 9. Debridement of ligamentum teres (83151) [17.37].     PRECAUTIONS:  precautions per protocol:   20 pounds foot flat partial weight bearing x 4 weeks (specific patient instructions say 2 weeks) Wean from crutches in brace weeks 3-4 Hip brace 0-90 degrees x 10 days then full ROM Brace wear 1-6 weeks as indicated by muscle control and comfort ROM limits: flexion 90 degrees x10 days, extension gentle x3 weeks; external rotation gentle x3 weeks; Internal rotation no limits; adduction <10 x6 weeks No DN   SUBJECTIVE:   My back was hurting on Saturday and I rested and used my TENs unit.  Now, the right side of my back is feeling like it is locking up.  PAIN:  Are you having pain? Yes NPRS scale: 3/10 Pain location: No hip pain, Rt>Lt low back Aggravating with standing and walking    OBJECTIVE: (objective measures completed at initial evaluation unless otherwise dated)   DIAGNOSTIC FINDINGS: labral tear   PATIENT SURVEYS:  FOTO 35% baseline       LOWER EXTREMITY ROM: limited per surgical precautions   passive ROM Right eval Left eval  Hip flexion 80 WFLs  Hip extension 0 WFLs  Hip abduction 5 WFLs  Hip adduction   WFLs  Hip internal rotation 10 WFLs  Hip external rotation   WFLs  Knee flexion      Knee extension      Ankle dorsiflexion      Ankle plantarflexion      Ankle inversion      Ankle eversion       (Blank rows = not  tested)   LOWER EXTREMITY MMT:  not tested secondary to surgical precautions; able to do quad set, glute set and abdominal brace     GAIT: Distance walked: 75 feet Assistive device utilized: Crutches Level of assistance: Modified independence Comments: brace on; slow pattern  FUNCTIONAL ASSESSMENTS: 12/16/2021: 3 Minute Walk Test:  450 ft with SPC 5 times sit to/from stand:  13.5 sec with hands on thighs     TODAY'S TREATMENT: 8/14: Sidestepping at the bar while stepping over hurdles Rt and Lt 5 laps  Mini squats at the bar 2x10 Standing on Rt LE with tap to cone on the floor with Lt 2x10 WB on right LE with step taps 15x Leg press seat 7  bil 60# 2x20   Supine with LEs on green ball-LE flexion/extension Supine with right piriformis stretch 15x Stepping forward over hurdles x4 laps Manual prone IR/ER  8/10: Sidestepping at the bar 5 laps  Standing foot on stool hip internal rotation 20x Mini squats at the bar 2x10 Standing on Rt LE with tap to cone on the floor with Lt 2x10 WB on right LE with step taps 15x Leg press seat 7  bil 60# 25x  4" lateral step down 2x10- good control.  Standing floor slider on right hip abduction 15x Supine with LEs on green ball-LE flexion/extension Supine with right piriformis stretch 15x Supine bridge with LEs on green ball 15x Stepping forward over hurdles x4 laps Manual prone IR/ER    12/16/2021: Seated hip marches 2x20 Sideplank on knees Rt x10, Lt isometric only due to challenge and pull on Rt hip Bilateral SKTC stretch with opposite leg outstretched 3x20 sec Supine: glute activation with pelvic tilt; lifting alternating LE in pelvic tilt position 20x Bridge: partial ROM with glute contraction 2x10 Sit to stand: 2x10 3 minute walk test and 5 times sit to/from stand Standing small hip circles both ways 10x each way Negotiating 6" steps with reciprocal pattern with UE support x3 4" lateral step down 2x10- good control.      PATIENT EDUCATION:  Education details: current HEP Person educated: Patient Education method: Customer service manager Education comprehension: verbalized understanding and returned demonstration     HOME EXERCISE PROGRAM: Access Code: [email or web address] URL: [email or web address] Date: 11/04/2021 Prepared by: Ruben Im   Exercises - Supine Heel Slide  - 1 x daily - 7 x weekly - 2 sets - 10 reps - Supine Hip Abduction  - 1 x daily - 7 x weekly - 1 sets - 5-10 reps - Supine Isometric Hip Adduction with Pillow at Knees  - 1 x daily -  7 x weekly - 1 sets - 5-10 reps - Prone Heel Squeeze  - 1 x daily - 7 x weekly - 1 sets - 5-10 reps As discussed   ASSESSMENT:   CLINICAL IMPRESSION: Pt is making steady progress while following protocol.  Pt arrived in the clinic without device and demonstrated improved symmetry.  Pt is having increased Rt low back pain with tension/spasm when standing/walking longer distances.  Limited some standing exercise due to low back tension/pain. PT encouraged her to take rest breaks as able and stretch.   She is using the cane when fatigued at the end of the day to support her low back.  Pt was able ascend and descend steps without pain when leading up with the Rt.  Pt demonstrates good pelvic alignment with single leg stability exercise on the Rt. Therapist monitoring response throughout session.    OBJECTIVE IMPAIRMENTS Abnormal gait, decreased activity tolerance, decreased mobility, difficulty walking, decreased ROM, decreased strength, impaired perceived functional ability, and pain.    ACTIVITY LIMITATIONS lifting, bending, sitting, standing, squatting, sleeping, stairs, and locomotion level   PARTICIPATION LIMITATIONS: meal prep, cleaning, laundry, driving, shopping, community activity, and occupation   PERSONAL FACTORS 1 comorbidity: previous back surgery and left hip surgery  are also affecting patient's functional outcome.    REHAB  POTENTIAL: Good   CLINICAL DECISION MAKING: Stable/uncomplicated   EVALUATION COMPLEXITY: Low     GOALS: Goals reviewed with patient? Yes   SHORT TERM GOALS: Target date: 12/14/2021 The patient will demonstrate knowledge of basic self care strategies and exercises to promote healing   Baseline: Goal status: Met (11/09/21)   2.  The patient will report a 50% improvement in pain levels with functional activities which are currently difficult including bending over, sitting for work Baseline: 65% better (12/23/21) Goal status:Met    3.  The patient will have full hip ROM needed for basic ADLs  Baseline:  Goal status: in progress    4.  The patient will be able to full weight bear and ambulate household and short community distances with ease Baseline: 1 crutch now (11/18/21) Goal status: Goal Met 12/16/2021 and able to complete 3 min walk test   LONG TERM GOALS: Target date: 01/25/2022   The patient will be independent in a safe self progression of a home exercise program to promote further recovery of function   Baseline:  Goal status: In progress    2.  The patient will report a 75% improvement in pain levels with functional activities which are currently difficult including  Baseline: 65% (12/23/21) Goal status: In progress    3.  Hip flexion, abduction, adduction and rotation strength grossly 4/5 or within 75% of noninvolved side Baseline:  Goal status: INITIAL   4.  The patient will have returned to the majority of home and work ADLs including traveling for work Baseline:  Goal status: In progress    5.  FOTO score improved to 56%  Baseline:  Goal status: INITIAL         PLAN: PT FREQUENCY: 2-3x/week   PT DURATION: 12 weeks   PLANNED INTERVENTIONS: Therapeutic exercises, Therapeutic activity, Neuromuscular re-education, Patient/Family education, Joint mobilization, Aquatic Therapy,  Electrical stimulation, Cryotherapy, Taping, Ultrasound, Ionotophoresis 76m/ml  Dexamethasone, and Manual therapy   PLAN FOR NEXT SESSION: per protocol date of surgery 6/15;  work on sideplank on knees and bridges (LEs on ball). Work on balance, stability and functional mobility  KSigurd Sos PT 12/27/21 8:46 AM  [facility] [address], [place], [place] [address] Phone # [phone] Fax [phone]

## 2022-01-04 ENCOUNTER — Ambulatory Visit: Payer: No Typology Code available for payment source

## 2022-01-04 DIAGNOSIS — M25551 Pain in right hip: Secondary | ICD-10-CM | POA: Diagnosis not present

## 2022-01-04 DIAGNOSIS — M5459 Other low back pain: Secondary | ICD-10-CM

## 2022-01-04 DIAGNOSIS — M6281 Muscle weakness (generalized): Secondary | ICD-10-CM

## 2022-01-04 DIAGNOSIS — R262 Difficulty in walking, not elsewhere classified: Secondary | ICD-10-CM

## 2022-01-04 NOTE — Therapy (Signed)
OUTPATIENT PHYSICAL THERAPY TREATMENT NOTE   Patient Name: Kelsey [name] MRN: [id number] DOB:1965-09-12, 56 y.o., female Today's Date: 01/04/2022   REFERRING PROVIDER: Dr. Aretha Parrot  END OF SESSION:   PT End of Session - 01/04/22 1014     Visit Number 19    Date for PT Re-Evaluation 03/01/22    Authorization Type [employer] 40 visits    Authorization - Visit Number 4    Authorization - Number of Visits 40    PT Start Time 0932    PT Stop Time 1015    PT Time Calculation (min) 43 min    Activity Tolerance Patient tolerated treatment well    Behavior During Therapy WFL for tasks assessed/performed                   Past Medical History:  Diagnosis Date   Abnormal Pap smear    Bulging lumbar disc    L4, l5--scheduled for surgery 05-04-18--Dr. Elsner   Hidradenitis    Hyperlipidemia    Infertility, female    no method of birth from late 20's through the 40's,  Headaches on OCP   Migraine    with aura   Sleep apnea 2021   Past Surgical History:  Procedure Laterality Date   ABDOMINAL EXPOSURE N/A 05/04/2018   Procedure: ABDOMINAL EXPOSURE;  Surgeon: Marty Heck, MD;  Location: [facility];  Service: Vascular;  Laterality: N/A;   ANKLE SURGERY  1998/1999   left ankle fracture with ORIF   ANTERIOR LUMBAR FUSION N/A 05/04/2018   Procedure: Lumbar Four-Five Anterior  lumbar interbody fusion;  Surgeon: Kristeen Miss, MD;  Location: [facility];  Service: Neurosurgery;  Laterality: N/A;  anterior   CARPAL TUNNEL RELEASE  05/21/12   right wrist   COLPOSCOPY     CIN I   HIP SURGERY Left 03-06-13   --[place] from injury   [place]  2000   off back - benign   Patient Active Problem List   Diagnosis Date Noted   Spondylolisthesis of lumbar region 05/04/2018   Degenerative disc disease, lumbar 04/17/2018   History of migraine headaches 01/28/2013   REFERRING DIAG: right hip pain M25.551   THERAPY DIAG:  right hip pain; weakness        Rationale for Evaluation and Treatment Rehabilitation   PERTINENT HISTORY:   Right hip arthroscopic surgery October 28, 2021  see protocol  next MD follow up 7/18   OPERATION PERFORMED: 1. Right hip arthroscopy (78242) [9.00]. 2. Synovectomy of central compartment (35361) [11.17]. 3. Synovectomy of peripheral compartment (44315) [11.17]. 4. Acetabuloplasty (40086) [15.00]. 5. Labral repair (76195) [15.00]. 6. Chondroplasty of acetabulum (09326) [11.17]. 7. Chondroplasty of femoral head (71245) [11.17]. 8. Femoroplasty (80998) [14.67]. 9. Debridement of ligamentum teres (33825) [17.37].     PRECAUTIONS:  precautions per protocol:   20 pounds foot flat partial weight bearing x 4 weeks (specific patient instructions say 2 weeks) Wean from crutches in brace weeks 3-4 Hip brace 0-90 degrees x 10 days then full ROM Brace wear 1-6 weeks as indicated by muscle control and comfort ROM limits: flexion 90 degrees x10 days, extension gentle x3 weeks; external rotation gentle x3 weeks; Internal rotation no limits; adduction <10 x6 weeks No DN   SUBJECTIVE:   My hip pain is much better and I am doing a lot more now.  MD said it is OK to DN my low back.   PAIN:  Are  you having pain? Yes NPRS scale: 1/10 Pain location: No hip pain, Rt>Lt low back Aggravating with standing and walking    OBJECTIVE: (objective measures completed at initial evaluation unless otherwise dated)   DIAGNOSTIC FINDINGS: labral tear   PATIENT SURVEYS:  FOTO 35% baseline 01/04/22: 47       LOWER EXTREMITY ROM: limited per surgical precautions   passive ROM Right eval Left eval  Hip flexion 80 WFLs  Hip extension 0 WFLs  Hip abduction 5 WFLs  Hip adduction   WFLs  Hip internal rotation 10 WFLs  Hip external rotation   WFLs  Knee flexion      Knee extension      Ankle dorsiflexion      Ankle plantarflexion      Ankle inversion      Ankle eversion       (Blank rows = not tested)   LOWER EXTREMITY MMT:   not tested secondary to surgical precautions; able to do quad set, glute set and abdominal brace     GAIT: 01/04/22 Distance walked: 75 feet Assistive device utilized: no device  Level of assistance: Independent  Comments: mild antalgia, normalized pattern  FUNCTIONAL ASSESSMENTS: 12/16/2021: 3 Minute Walk Test:  450 ft with SPC 5 times sit to/from stand:  13.5 sec with hands on thighs     TODAY'S TREATMENT:  8/22: Sidestepping at the bar while stepping over hurdles Rt and Lt 5 laps  Mini squats at the bar 2x10 Standing on Rt LE with tap to cone on the floor with Lt 2x10 WB on right LE with step taps 15x Leg press seat 7  bil 60# 2x20   Supine with right piriformis stretch 15x Stepping forward over hurdles x4 laps Trigger Point Dry-Needling  Treatment instructions: Expect mild to moderate muscle soreness. S/S of pneumothorax if dry needled over a lung field, and to seek immediate medical attention should they occur. Patient verbalized understanding of these instructions and education.  Patient Consent Given: Yes Education handout provided: Previously provided Muscles treated: bil lumbar multifidi, proximal Rt gluteals  Treatment response/outcome: Utilized skilled palpation to identify trigger points.  During dry needling able to palpate muscle twitch and muscle elongation  Elongation to bil low back and Rt gluteals  Skilled palpation and monitoring by PT during dry needling  8/14: Sidestepping at the bar while stepping over hurdles Rt and Lt 5 laps  Mini squats at the bar 2x10 Standing on Rt LE with tap to cone on the floor with Lt 2x10 WB on right LE with step taps 15x Leg press seat 7  bil 60# 2x20   Supine with LEs on green ball-LE flexion/extension Supine with right piriformis stretch 15x Stepping forward over hurdles x4 laps Manual prone IR/ER  8/10: Sidestepping at the bar 5 laps  Standing foot on stool hip internal rotation 20x Mini squats at the bar 2x10 Standing  on Rt LE with tap to cone on the floor with Lt 2x10 WB on right LE with step taps 15x Leg press seat 7  bil 60# 25x  4" lateral step down 2x10- good control.  Standing floor slider on right hip abduction 15x Supine with LEs on green ball-LE flexion/extension Supine with right piriformis stretch 15x Supine bridge with LEs on green ball 15x Stepping forward over hurdles x4 laps Manual prone IR/ER    PATIENT EDUCATION:  Education details: current HEP Person educated: Patient Education method: Customer service manager Education comprehension: verbalized understanding and returned demonstration  HOME EXERCISE PROGRAM: Access Code: [email or web address] URL: [email or web address] Date: 11/04/2021 Prepared by: Ruben Im   Exercises - Supine Heel Slide  - 1 x daily - 7 x weekly - 2 sets - 10 reps - Supine Hip Abduction  - 1 x daily - 7 x weekly - 1 sets - 5-10 reps - Supine Isometric Hip Adduction with Pillow at Knees  - 1 x daily - 7 x weekly - 1 sets - 5-10 reps - Prone Heel Squeeze  - 1 x daily - 7 x weekly - 1 sets - 5-10 reps As discussed   ASSESSMENT:   CLINICAL IMPRESSION: Pt is making steady progress while following protocol.  Pt is having increased Rt low back pain with tension/spasm when standing/walking longer distances.  Pt with tension and trigger points in low back and had good response to DN with twitch and improved tissue mobility.    Pt also reports 5/10 fatigue level with walking 1 mile for exercise.  Pt with limited functional hip strength s/p surgery and is not able to squat to care for her dog or get into low cabinets.  She is using the cane when fatigued at the end of the day to support her low back.  Pt was able ascend and descend steps without pain when leading up with the Rt.  Pt demonstrates good pelvic alignment with single leg stability exercise on the Rt. Therapist monitoring response throughout session.    OBJECTIVE IMPAIRMENTS Abnormal gait,  decreased activity tolerance, decreased mobility, difficulty walking, decreased ROM, decreased strength, impaired perceived functional ability, and pain.    ACTIVITY LIMITATIONS lifting, bending, sitting, standing, squatting, sleeping, stairs, and locomotion level   PARTICIPATION LIMITATIONS: meal prep, cleaning, laundry, driving, shopping, community activity, and occupation   PERSONAL FACTORS 1 comorbidity: previous back surgery and left hip surgery  are also affecting patient's functional outcome.    REHAB POTENTIAL: Good   CLINICAL DECISION MAKING: Stable/uncomplicated   EVALUATION COMPLEXITY: Low     GOALS: Goals reviewed with patient? Yes   SHORT TERM GOALS: Target date: 02/15/22 The patient will demonstrate knowledge of basic self care strategies and exercises to promote healing   Baseline: Goal status: Met (11/09/21)   2.  The patient will report a 50% improvement in pain levels with functional activities which are currently difficult including bending over, sitting for work Baseline: 65% better (12/23/21) Goal status:Met    3.  The patient will have full hip ROM needed for basic ADLs  Baseline:  Goal status: in progress    4.  The patient will be able to full weight bear and ambulate household and short community distances with ease Baseline: 1 crutch now (11/18/21) Goal status: Goal Met 12/16/2021 and able to complete 3 min walk test   LONG TERM GOALS: Target date: 03/01/22   The patient will be independent in a safe self progression of a home exercise program to promote further recovery of function   Baseline:  Goal status: In progress    2.  The patient will report a 90% improvement in hip pain levels with functional activities which are currently difficult including  Baseline: 75% (01/04/22) Goal status: In progress    3.  Improve hip strength and stability to walk on unlevel surfaces with confidence and report no loss of balance or instability  Baseline: unsteady  and limited functional strength  Goal status: revised    4.  The patient will have returned to the majority of home and work ADLs  including traveling for work Baseline: limited with endurance tasks Goal status: In progress    5.  FOTO score improved to 56%  Baseline: 47 Goal status: In progress    6.  Squat with ALDs including care of dog and getting objects from the floor without difficulty   Baseline: not able to squat past chair    Goal Status: INITIAL   7. Walk 1 mile or more for exercise and report < or = to 2/10 fatigue level   Baseline: 5/10 with walking 1 mile    Goal Status: NEW         PLAN: PT FREQUENCY: 2 x/week   PT DURATION: 8 weeks   PLANNED INTERVENTIONS: Therapeutic exercises, Therapeutic activity, Neuromuscular re-education, Patient/Family education, Joint mobilization, Aquatic Therapy,  Electrical stimulation, Cryotherapy, Taping,  Dry needling, Ultrasound, Ionotophoresis 72m/ml Dexamethasone, and Manual therapy   PLAN FOR NEXT SESSION: per protocol date of surgery 6/15;  work on sideplank on knees and bridges (LEs on ball). Work on balance, stability and functional mobility, assess DN to low back and how it impacts her functional mobility  KSigurd Sos PT 01/04/22 10:16 AM   [facility] [place] [address]Phone # [phone] [phone]

## 2022-01-14 ENCOUNTER — Ambulatory Visit: Payer: No Typology Code available for payment source | Attending: Orthopedic Surgery | Admitting: Physical Therapy

## 2022-01-14 ENCOUNTER — Encounter: Payer: Self-pay | Admitting: Physical Therapy

## 2022-01-14 DIAGNOSIS — M5459 Other low back pain: Secondary | ICD-10-CM | POA: Diagnosis present

## 2022-01-14 DIAGNOSIS — M6281 Muscle weakness (generalized): Secondary | ICD-10-CM | POA: Diagnosis present

## 2022-01-14 DIAGNOSIS — R262 Difficulty in walking, not elsewhere classified: Secondary | ICD-10-CM | POA: Insufficient documentation

## 2022-01-14 DIAGNOSIS — M25551 Pain in right hip: Secondary | ICD-10-CM | POA: Insufficient documentation

## 2022-01-14 NOTE — Therapy (Signed)
OUTPATIENT PHYSICAL THERAPY TREATMENT NOTE   Patient Name: Kelsey [name] MRN: [id number] DOB:[birth year], 56 y.o., female Today's Date: 01/14/2022  PCP: Cari Caraway, MD REFERRING PROVIDER: Dr. Aretha Parrot  END OF SESSION:   PT End of Session - 01/14/22 0845     Visit Number 20    Authorization Type Aet[employer] 40 visits    Authorization - Visit Number 39    Authorization - Number of Visits 40    PT Start Time 0800    PT Stop Time 0840    PT Time Calculation (min) 40 min    Activity Tolerance Patient tolerated treatment well    Behavior During Therapy [facility] for tasks assessed/performed             Past Medical History:  Diagnosis Date   Abnormal Pap smear    Bulging lumbar disc    L4, l5--scheduled for surgery 05-04-18--Dr. Elsner   Hidradenitis    Hyperlipidemia    Infertility, female    no method of birth from late 20's through the 40's,  Headaches on OCP   Migraine    with aura   Sleep apnea 2021   Past Surgical History:  Procedure Laterality Date   ABDOMINAL EXPOSURE N/A 05/04/2018   Procedure: ABDOMINAL EXPOSURE;  Surgeon: Marty Heck, MD;  Location: [facility];  Service: Vascular;  Laterality: N/A;   ANKLE SURGERY  1998/1999   left ankle fracture with ORIF   ANTERIOR LUMBAR FUSION N/A 05/04/2018   Procedure: Lumbar Four-Five Anterior  lumbar interbody fusion;  Surgeon: Kristeen Miss, MD;  Location: [facility];  Service: Neurosurgery;  Laterality: N/A;  anterior   CARPAL TUNNEL RELEASE  05/21/12   right wrist   COLPOSCOPY     CIN I   HIP SURGERY Left 03-06-13   --[place] from injury   [place]  2000   off back - benign   Patient Active Problem List   Diagnosis Date Noted   Spondylolisthesis of lumbar region 05/04/2018   Degenerative disc disease, lumbar 04/17/2018   History of migraine headaches 01/28/2013   REFERRING DIAG: right hip pain M25.551   THERAPY DIAG:  right hip pain; weakness       Rationale for Evaluation and  Treatment Rehabilitation   PERTINENT HISTORY:   Right hip arthroscopic surgery October 28, 2021  see protocol  next MD follow up 7/18   OPERATION PERFORMED: 1. Right hip arthroscopy (86578) [9.00]. 2. Synovectomy of central compartment (46962) [11.17]. 3. Synovectomy of peripheral compartment (95284) [11.17]. 4. Acetabuloplasty (13244) [15.00]. 5. Labral repair (01027) [15.00]. 6. Chondroplasty of acetabulum (25366) [11.17]. 7. Chondroplasty of femoral head (44034) [11.17]. 8. Femoroplasty (74259) [14.67]. 9. Debridement of ligamentum teres (56387) [17.37].     PRECAUTIONS:  precautions per protocol:   20 pounds foot flat partial weight bearing x 4 weeks (specific patient instructions say 2 weeks) Wean from crutches in brace weeks 3-4 Hip brace 0-90 degrees x 10 days then full ROM Brace wear 1-6 weeks as indicated by muscle control and comfort ROM limits: flexion 90 degrees x10 days, extension gentle x3 weeks; external rotation gentle x3 weeks; Internal rotation no limits; adduction <10 x6 weeks No DN   SUBJECTIVE:   The dry needling helped.    PAIN:  Are you having pain? Yes NPRS scale: 1/10 Pain location: No hip pain, Rt>Lt low back Aggravating with standing and walking      OBJECTIVE: (objective measures completed at initial  evaluation unless otherwise dated)   DIAGNOSTIC FINDINGS: labral tear   PATIENT SURVEYS:  FOTO 35% baseline 01/04/22: 47       LOWER EXTREMITY ROM: limited per surgical precautions   passive ROM Right eval Left eval  Hip flexion 80 WFLs  Hip extension 0 WFLs  Hip abduction 5 WFLs  Hip adduction   WFLs  Hip internal rotation 10 WFLs  Hip external rotation   WFLs  Knee flexion      Knee extension      Ankle dorsiflexion      Ankle plantarflexion      Ankle inversion      Ankle eversion       (Blank rows = not tested)   LOWER EXTREMITY MMT:  not tested secondary to surgical precautions; able to do quad set, glute set and abdominal brace      GAIT: 01/04/22 Distance walked: 75 feet Assistive device utilized: no device  Level of assistance: Independent  Comments: mild antalgia, normalized pattern   FUNCTIONAL ASSESSMENTS: 12/16/2021: 3 Minute Walk Test:  450 ft with SPC 5 times sit to/from stand:  13.5 sec with hands on thighs     TODAY'S TREATMENT: 01/14/2022  Sidestepping at the bar while stepping over hurdles Rt and Lt 5 laps   Walk forward over hurdles 10x Mini squats at the bar 2x10 Standing on Rt LE with tap to cone on the floor with Lt 2x10 with keeping pelvis level and engaging her abdomen and reduce shifting far to the right Stand on left and toe tap on cone with right keeping pelvis leveled and knee inline with hip Leg press seat 7  bil 60# 2x20   On the leg press lifting the right leg onto foot pad 10x with not letting the leg go inward Trigger Point Dry-Needling  Treatment instructions: Expect mild to moderate muscle soreness. S/S of pneumothorax if dry needled over a lung field, and to seek immediate medical attention should they occur. Patient verbalized understanding of these instructions and education.  Patient Consent Given: Yes Education handout provided: Previously provided Muscles treated: distal right quads Electrical stimulation performed: No Parameters: N/A Treatment response/outcome: elongation of right quads and trigger point response.  Skilled palpation and monitoring by PT during dry needling     8/22: Sidestepping at the bar while stepping over hurdles Rt and Lt 5 laps  Mini squats at the bar 2x10 Standing on Rt LE with tap to cone on the floor with Lt 2x10 WB on right LE with step taps 15x Leg press seat 7  bil 60# 2x20   Supine with right piriformis stretch 15x Stepping forward over hurdles x4 laps Bridge with legs on ball with abdominal bracing 15x and able to do with greater ease Manual; using suction cup to the distal right quads and then manual work to elongate the muscle after  dry needling Trigger Point Dry-Needling  Treatment instructions: Expect mild to moderate muscle soreness. S/S of pneumothorax if dry needled over a lung field, and to seek immediate medical attention should they occur. Patient verbalized understanding of these instructions and education.   Patient Consent Given: Yes Education handout provided: Previously provided Muscles treated: bil lumbar multifidi, proximal Rt gluteals  Treatment response/outcome: Utilized skilled palpation to identify trigger points.  During dry needling able to palpate muscle twitch and muscle elongation  Elongation to bil low back and Rt gluteals  Skilled palpation and monitoring by PT during dry needling  8/14: Sidestepping at the bar  while stepping over hurdles Rt and Lt 5 laps  Mini squats at the bar 2x10 Standing on Rt LE with tap to cone on the floor with Lt 2x10 WB on right LE with step taps 15x Leg press seat 7  bil 60# 2x20   Supine with LEs on green ball-LE flexion/extension Supine with right piriformis stretch 15x Stepping forward over hurdles x4 laps Manual prone IR/ER   8/10: Sidestepping at the bar 5 laps  Standing foot on stool hip internal rotation 20x Mini squats at the bar 2x10 Standing on Rt LE with tap to cone on the floor with Lt 2x10 WB on right LE with step taps 15x Leg press seat 7  bil 60# 25x  4" lateral step down 2x10- good control.  Standing floor slider on right hip abduction 15x Supine with LEs on green ball-LE flexion/extension Supine with right piriformis stretch 15x Supine bridge with LEs on green ball 15x Stepping forward over hurdles x4 laps Manual prone IR/ER       PATIENT EDUCATION:  Education details: current HEP Person educated: Patient Education method: Customer service manager Education comprehension: verbalized understanding and returned demonstration     HOME EXERCISE PROGRAM: Access Code: [email or web address] URL: [email or web address] Date:  11/04/2021 Prepared by: Ruben Im   Exercises - Supine Heel Slide  - 1 x daily - 7 x weekly - 2 sets - 10 reps - Supine Hip Abduction  - 1 x daily - 7 x weekly - 1 sets - 5-10 reps - Supine Isometric Hip Adduction with Pillow at Knees  - 1 x daily - 7 x weekly - 1 sets - 5-10 reps - Prone Heel Squeeze  - 1 x daily - 7 x weekly - 1 sets - 5-10 reps As discussed   ASSESSMENT:   CLINICAL IMPRESSION: Pt is making steady progress while following protocol.  Patient is not having spasm in her low back now and feels better where the dry needling was done. Patient able to do a bridge with feet on ball and able to lift with greater ease. Patient is able to stand up straight without back pain after treatment. Patient is learning how to keep her pelvis leveled and engage her core with her exercises. Therapist monitoring response throughout session.     OBJECTIVE IMPAIRMENTS Abnormal gait, decreased activity tolerance, decreased mobility, difficulty walking, decreased ROM, decreased strength, impaired perceived functional ability, and pain.    ACTIVITY LIMITATIONS lifting, bending, sitting, standing, squatting, sleeping, stairs, and locomotion level   PARTICIPATION LIMITATIONS: meal prep, cleaning, laundry, driving, shopping, community activity, and occupation   PERSONAL FACTORS 1 comorbidity: previous back surgery and left hip surgery  are also affecting patient's functional outcome.    REHAB POTENTIAL: Good   CLINICAL DECISION MAKING: Stable/uncomplicated   EVALUATION COMPLEXITY: Low     GOALS: Goals reviewed with patient? Yes   SHORT TERM GOALS: Target date: 02/15/22 The patient will demonstrate knowledge of basic self care strategies and exercises to promote healing   Baseline: Goal status: Met (11/09/21)   2.  The patient will report a 50% improvement in pain levels with functional activities which are currently difficult including bending over, sitting for work Baseline: 65% better  (12/23/21) Goal status:Met    3.  The patient will have full hip ROM needed for basic ADLs  Baseline:  Goal status: in progress    4.  The patient will be able to full weight bear and ambulate household and short community distances with ease  Baseline: 1 crutch now (11/18/21) Goal status: Goal Met 12/16/2021 and able to complete 3 min walk test   LONG TERM GOALS: Target date: 03/01/22   The patient will be independent in a safe self progression of a home exercise program to promote further recovery of function   Baseline:  Goal status: In progress    2.  The patient will report a 90% improvement in hip pain levels with functional activities which are currently difficult including  Baseline: 75% (01/04/22) Goal status: In progress    3.  Improve hip strength and stability to walk on unlevel surfaces with confidence and report no loss of balance or instability  Baseline: unsteady and limited functional strength  Goal status: revised    4.  The patient will have returned to the majority of home and work ADLs including traveling for work Baseline: limited with endurance tasks Goal status: In progress    5.  FOTO score improved to 56%  Baseline: 47 Goal status: In progress                6.  Squat with ALDs including care of dog and getting objects from the floor without difficulty                         Baseline: not able to squat past chair                          Goal Status: INITIAL               7. Walk 1 mile or more for exercise and report < or = to 2/10 fatigue level                         Baseline: 5/10 with walking 1 mile                          Goal Status: NEW         PLAN: PT FREQUENCY: 2 x/week   PT DURATION: 8 weeks   PLANNED INTERVENTIONS: Therapeutic exercises, Therapeutic activity, Neuromuscular re-education, Patient/Family education, Joint mobilization, Aquatic Therapy,  Electrical stimulation, Cryotherapy, Taping,  Dry needling, Ultrasound, Ionotophoresis  80m/ml Dexamethasone, and Manual therapy   PLAN FOR NEXT SESSION: per protocol date of surgery 6/15;  work on sideplank on knees and bridges (LEs on ball). Work on balance, stability and functional mobility, assess DN to low back and how it impacts her functional mobility  CEarlie Counts PT 01/14/22 8:45 AM

## 2022-01-18 ENCOUNTER — Ambulatory Visit: Payer: No Typology Code available for payment source

## 2022-01-18 DIAGNOSIS — M25551 Pain in right hip: Secondary | ICD-10-CM | POA: Diagnosis not present

## 2022-01-18 DIAGNOSIS — M6281 Muscle weakness (generalized): Secondary | ICD-10-CM

## 2022-01-18 DIAGNOSIS — R262 Difficulty in walking, not elsewhere classified: Secondary | ICD-10-CM

## 2022-01-18 DIAGNOSIS — M5459 Other low back pain: Secondary | ICD-10-CM

## 2022-01-18 NOTE — Therapy (Signed)
OUTPATIENT PHYSICAL THERAPY TREATMENT NOTE   Patient Name: Kelsey [name] MRN: [id number] DOB:[birth year], 56 y.o., female Today's Date: 01/18/2022  PCP: Cari Caraway, MD REFERRING PROVIDER: Dr. Aretha Parrot  END OF SESSION:   PT End of Session - 01/18/22 0923     Visit Number 21    Date for PT Re-Evaluation 03/01/22    Authorization Type [employer] 40 visits    Authorization - Visit Number 21    Authorization - Number of Visits 40    PT Start Time 0847    PT Stop Time 0926    PT Time Calculation (min) 39 min    Activity Tolerance Patient tolerated treatment well    Behavior During Therapy [facility] for tasks assessed/performed              Past Medical History:  Diagnosis Date   Abnormal Pap smear    Bulging lumbar disc    L4, l5--scheduled for surgery 05-04-18--Dr. Elsner   Hidradenitis    Hyperlipidemia    Infertility, female    no method of birth from late 20's through the 40's,  Headaches on OCP   Migraine    with aura   Sleep apnea 2021   Past Surgical History:  Procedure Laterality Date   ABDOMINAL EXPOSURE N/A 05/04/2018   Procedure: ABDOMINAL EXPOSURE;  Surgeon: Marty Heck, MD;  Location: [place];  Service: Vascular;  Laterality: N/A;   ANKLE SURGERY  1998/1999   left ankle fracture with ORIF   ANTERIOR LUMBAR FUSION N/A 05/04/2018   Procedure: Lumbar Four-Five Anterior  lumbar interbody fusion;  Surgeon: Kristeen Miss, MD;  Location: [place];  Service: Neurosurgery;  Laterality: N/A;  anterior   CARPAL TUNNEL RELEASE  05/21/12   right wrist   COLPOSCOPY     CIN I   HIP SURGERY Left 03-06-13   --[place] from injury   [place]  2000   off back - benign   Patient Active Problem List   Diagnosis Date Noted   Spondylolisthesis of lumbar region 05/04/2018   Degenerative disc disease, lumbar 04/17/2018   History of migraine headaches 01/28/2013   REFERRING DIAG: right hip pain M25.551   THERAPY DIAG:  right hip pain;  weakness       Rationale for Evaluation and Treatment Rehabilitation   PERTINENT HISTORY:   Right hip arthroscopic surgery October 28, 2021  see protocol  next MD follow up 7/18   OPERATION PERFORMED: 1. Right hip arthroscopy (02409) [9.00]. 2. Synovectomy of central compartment (73532) [11.17]. 3. Synovectomy of peripheral compartment (99242) [11.17]. 4. Acetabuloplasty (68341) [15.00]. 5. Labral repair (96222) [15.00]. 6. Chondroplasty of acetabulum (97989) [11.17]. 7. Chondroplasty of femoral head (21194) [11.17]. 8. Femoroplasty (17408) [14.67]. 9. Debridement of ligamentum teres (14481) [17.37].     PRECAUTIONS:  precautions per protocol:   20 pounds foot flat partial weight bearing x 4 weeks (specific patient instructions say 2 weeks) Wean from crutches in brace weeks 3-4 Hip brace 0-90 degrees x 10 days then full ROM Brace wear 1-6 weeks as indicated by muscle control and comfort ROM limits: flexion 90 degrees x10 days, extension gentle x3 weeks; external rotation gentle x3 weeks; Internal rotation no limits; adduction <10 x6 weeks No DN   SUBJECTIVE:   I am working on pelvic symmetry with single limb activity.     PAIN:  Are you having pain? Yes NPRS scale: 1/10 Pain location: No hip pain, Rt>Lt low back  Aggravating with standing and walking      OBJECTIVE: (objective measures completed at initial evaluation unless otherwise dated)   DIAGNOSTIC FINDINGS: labral tear   PATIENT SURVEYS:  FOTO 35% baseline 01/04/22: 47   LOWER EXTREMITY ROM: limited per surgical precautions   passive ROM Right eval Left eval  Hip flexion 80 WFLs  Hip extension 0 WFLs  Hip abduction 5 WFLs  Hip adduction   WFLs  Hip internal rotation 10 WFLs  Hip external rotation   WFLs  Knee flexion      Knee extension      Ankle dorsiflexion      Ankle plantarflexion      Ankle inversion      Ankle eversion       (Blank rows = not tested)   LOWER EXTREMITY MMT:  not tested secondary  to surgical precautions; able to do quad set, glute set and abdominal brace     GAIT: 01/04/22 Distance walked: 75 feet Assistive device utilized: no device  Level of assistance: Independent  Comments: mild antalgia, normalized pattern   FUNCTIONAL ASSESSMENTS: 12/16/2021: 3 Minute Walk Test:  450 ft with SPC 5 times sit to/from stand:  13.5 sec with hands on thighs     TODAY'S TREATMENT: 01/18/2022  Sidestepping at the bar while stepping over hurdles Rt and Lt 5 laps   Walk forward over hurdles and sidestepping x6 laps each Mini squats at the bar 2x10 Seated hip flexion: 5# 2x10 on Rt Standing on balance pad: weight shifting 3 ways x1.5 min each Standing on Rt LE with tap to cone on the floor with Lt 2x10 with keeping pelvis level and engaging her abdomen and reduce shifting far to the right Stand on left and toe tap on cone with right keeping pelvis leveled and knee inline with hip Leg press seat 7  bil 60# 2x20 , single leg press: 30# Rt only 2x10 On the leg press lifting the right leg onto foot pad 10x with not letting the leg go inward Tandem stance on balance pad: 3x20 seconds   01/14/2022  Sidestepping at the bar while stepping over hurdles Rt and Lt 5 laps   Walk forward over hurdles 10x Mini squats at the bar 2x10 Standing on Rt LE with tap to cone on the floor with Lt 2x10 with keeping pelvis level and engaging her abdomen and reduce shifting far to the right Stand on left and toe tap on cone with right keeping pelvis leveled and knee inline with hip Leg press seat 7  bil 60# 2x20   On the leg press lifting the right leg onto foot pad 10x with not letting the leg go inward Trigger Point Dry-Needling  Treatment instructions: Expect mild to moderate muscle soreness. S/S of pneumothorax if dry needled over a lung field, and to seek immediate medical attention should they occur. Patient verbalized understanding of these instructions and education.  Patient Consent Given:  Yes Education handout provided: Previously provided Muscles treated: distal right quads Electrical stimulation performed: No Parameters: N/A Treatment response/outcome: elongation of right quads and trigger point response.  Skilled palpation and monitoring by PT during dry needling     8/22: Sidestepping at the bar while stepping over hurdles Rt and Lt 5 laps  Mini squats at the bar 2x10 Standing on Rt LE with tap to cone on the floor with Lt 2x10 WB on right LE with step taps 15x Leg press seat 7  bil 60# 2x20  Supine with right piriformis stretch 15x Stepping forward over hurdles x4 laps Bridge with legs on ball with abdominal bracing 15x and able to do with greater ease Manual; using suction cup to the distal right quads and then manual work to elongate the muscle after dry needling Trigger Point Dry-Needling  Treatment instructions: Expect mild to moderate muscle soreness. S/S of pneumothorax if dry needled over a lung field, and to seek immediate medical attention should they occur. Patient verbalized understanding of these instructions and education.   Patient Consent Given: Yes Education handout provided: Previously provided Muscles treated: bil lumbar multifidi, proximal Rt gluteals  Treatment response/outcome: Utilized skilled palpation to identify trigger points.  During dry needling able to palpate muscle twitch and muscle elongation  Elongation to bil low back and Rt gluteals  Skilled palpation and monitoring by PT during dry needling  PATIENT EDUCATION:  Education details: current HEP Person educated: Patient Education method: Customer service manager Education comprehension: verbalized understanding and returned demonstration     HOME EXERCISE PROGRAM: Access Code: [facility] URL: [email or web address] Date: 11/04/2021 Prepared by: Ruben Im   Exercises - Supine Heel Slide  - 1 x daily - 7 x weekly - 2 sets - 10 reps - Supine Hip  Abduction  - 1 x daily - 7 x weekly - 1 sets - 5-10 reps - Supine Isometric Hip Adduction with Pillow at Knees  - 1 x daily - 7 x weekly - 1 sets - 5-10 reps - Prone Heel Squeeze  - 1 x daily - 7 x weekly - 1 sets - 5-10 reps As discussed   ASSESSMENT:   CLINICAL IMPRESSION: Low back and hip are both significantly improved over the past week. Pt is able to walk x 15 minutes in her neighborhood and reports only fatigue after.  Pt is working on co-contraction of hip and core muscle groups for pelvic alignment.  Pt is doing well with balance and dynamic tasks and is able to correct well for instability. Verbal cues provided to reduce Rt knee hyperextension. Therapist monitoring response throughout session.     OBJECTIVE IMPAIRMENTS Abnormal gait, decreased activity tolerance, decreased mobility, difficulty walking, decreased ROM, decreased strength, impaired perceived functional ability, and pain.    ACTIVITY LIMITATIONS lifting, bending, sitting, standing, squatting, sleeping, stairs, and locomotion level   PARTICIPATION LIMITATIONS: meal prep, cleaning, laundry, driving, shopping, community activity, and occupation   PERSONAL FACTORS 1 comorbidity: previous back surgery and left hip surgery  are also affecting patient's functional outcome.    REHAB POTENTIAL: Good   CLINICAL DECISION MAKING: Stable/uncomplicated   EVALUATION COMPLEXITY: Low     GOALS: Goals reviewed with patient? Yes   SHORT TERM GOALS: Target date: 02/15/22 The patient will demonstrate knowledge of basic self care strategies and exercises to promote healing   Baseline: Goal status: Met (11/09/21)   2.  The patient will report a 50% improvement in pain levels with functional activities which are currently difficult including bending over, sitting for work Baseline: 65% better (12/23/21) Goal status:Met    3.  The patient will have full hip ROM needed for basic ADLs  Baseline:  Goal status: in progress    4.  The  patient will be able to full weight bear and ambulate household and short community distances with ease Baseline: 1 crutch now (11/18/21) Goal status: Goal Met 12/16/2021 and able to complete 3 min walk test   LONG TERM GOALS: Target date: 03/01/22   The patient will be independent  in a safe self progression of a home exercise program to promote further recovery of function   Baseline:  Goal status: In progress    2.  The patient will report a 90% improvement in hip pain levels with functional activities which are currently difficult including  Baseline: 75% (01/04/22) Goal status: In progress    3.  Improve hip strength and stability to walk on unlevel surfaces with confidence and report no loss of balance or instability  Baseline: unsteady and limited functional strength  Goal status: revised    4.  The patient will have returned to the majority of home and work ADLs including traveling for work Baseline: limited with endurance tasks Goal status: In progress    5.  FOTO score improved to 56%  Baseline: 47 Goal status: In progress                6.  Squat with ALDs including care of dog and getting objects from the floor without difficulty                         Baseline: not able to squat past chair                          Goal Status: INITIAL               7. Walk 1 mile or more for exercise and report < or = to 2/10 fatigue level                         Baseline: 5/10 with walking 1 mile                          Goal Status: NEW         PLAN: PT FREQUENCY: 2 x/week   PT DURATION: 8 weeks   PLANNED INTERVENTIONS: Therapeutic exercises, Therapeutic activity, Neuromuscular re-education, Patient/Family education, Joint mobilization, Aquatic Therapy,  Electrical stimulation, Cryotherapy, Taping,  Dry needling, Ultrasound, Ionotophoresis 75m/ml Dexamethasone, and Manual therapy   PLAN FOR NEXT SESSION: per protocol date of surgery 6/15;  work on sideplank on knees and bridges  (LEs on ball). Work on balance, stability and functional mobility  KSigurd Sos PVirginia09/05/23 9:26 AM   [place] [place] [address]Phone # [phone] [phone]

## 2022-01-24 ENCOUNTER — Ambulatory Visit: Payer: No Typology Code available for payment source

## 2022-01-24 DIAGNOSIS — R262 Difficulty in walking, not elsewhere classified: Secondary | ICD-10-CM

## 2022-01-24 DIAGNOSIS — M6281 Muscle weakness (generalized): Secondary | ICD-10-CM

## 2022-01-24 DIAGNOSIS — M25551 Pain in right hip: Secondary | ICD-10-CM

## 2022-01-24 DIAGNOSIS — M5459 Other low back pain: Secondary | ICD-10-CM

## 2022-01-24 NOTE — Therapy (Signed)
OUTPATIENT PHYSICAL THERAPY TREATMENT NOTE   Patient Name: Kelsey [name] MRN: [id number] DOB:1965-07-06, 56 y.o., female Today's Date: 01/24/2022  PCP: Cari Caraway, MD REFERRING PROVIDER: Dr. Aretha Parrot  END OF SESSION:   PT End of Session - 01/24/22 0844     Visit Number 22    Date for PT Re-Evaluation 03/01/22    Authorization Type Aet[employer] 40 visits    Authorization - Visit Number 32    Authorization - Number of Visits 40    PT Start Time 0801    PT Stop Time 0845    PT Time Calculation (min) 44 min    Activity Tolerance Patient tolerated treatment well    Behavior During Therapy [facility] for tasks assessed/performed               Past Medical History:  Diagnosis Date   Abnormal Pap smear    Bulging lumbar disc    L4, l5--scheduled for surgery 05-04-18--Dr. Elsner   Hidradenitis    Hyperlipidemia    Infertility, female    no method of birth from late 20's through the 40's,  Headaches on OCP   Migraine    with aura   Sleep apnea 2021   Past Surgical History:  Procedure Laterality Date   ABDOMINAL EXPOSURE N/A 05/04/2018   Procedure: ABDOMINAL EXPOSURE;  Surgeon: Marty Heck, MD;  Location: [place];  Service: Vascular;  Laterality: N/A;   ANKLE SURGERY  1998/1999   left ankle fracture with ORIF   ANTERIOR LUMBAR FUSION N/A 05/04/2018   Procedure: Lumbar Four-Five Anterior  lumbar interbody fusion;  Surgeon: Kristeen Miss, MD;  Location: [place];  Service: Neurosurgery;  Laterality: N/A;  anterior   CARPAL TUNNEL RELEASE  05/21/12   right wrist   COLPOSCOPY     CIN I   HIP SURGERY Left 03-06-13   --[place] from injury   [place]  2000   off back - benign   Patient Active Problem List   Diagnosis Date Noted   Spondylolisthesis of lumbar region 05/04/2018   Degenerative disc disease, lumbar 04/17/2018   History of migraine headaches 01/28/2013   REFERRING DIAG: right hip pain M25.551   THERAPY DIAG:  right hip pain;  weakness       Rationale for Evaluation and Treatment Rehabilitation   PERTINENT HISTORY:   Right hip arthroscopic surgery October 28, 2021  see protocol  next MD follow up 7/18   OPERATION PERFORMED: 1. Right hip arthroscopy (73220) [9.00]. 2. Synovectomy of central compartment (25427) [11.17]. 3. Synovectomy of peripheral compartment (06237) [11.17]. 4. Acetabuloplasty (62831) [15.00]. 5. Labral repair (51761) [15.00]. 6. Chondroplasty of acetabulum (60737) [11.17]. 7. Chondroplasty of femoral head (10626) [11.17]. 8. Femoroplasty (94854) [14.67]. 9. Debridement of ligamentum teres (62703) [17.37].     PRECAUTIONS:  precautions per protocol:   20 pounds foot flat partial weight bearing x 4 weeks (specific patient instructions say 2 weeks) Wean from crutches in brace weeks 3-4 Hip brace 0-90 degrees x 10 days then full ROM Brace wear 1-6 weeks as indicated by muscle control and comfort ROM limits: flexion 90 degrees x10 days, extension gentle x3 weeks; external rotation gentle x3 weeks; Internal rotation no limits; adduction <10 x6 weeks No DN   SUBJECTIVE:   My back is really hurting.  Can we needle it today, that helped a lot last time.   PAIN:  Are you having pain? Yes NPRS scale: 5/10 Pain location:  No hip pain, Rt>Lt low back Aggravating with standing and walking      OBJECTIVE: (objective measures completed at initial evaluation unless otherwise dated)   DIAGNOSTIC FINDINGS: labral tear   PATIENT SURVEYS:  FOTO 35% baseline 01/04/22: 47   LOWER EXTREMITY ROM: limited per surgical precautions   passive ROM Right eval Left eval  Hip flexion 80 WFLs  Hip extension 0 WFLs  Hip abduction 5 WFLs  Hip adduction   WFLs  Hip internal rotation 10 WFLs  Hip external rotation   WFLs  Knee flexion      Knee extension      Ankle dorsiflexion      Ankle plantarflexion      Ankle inversion      Ankle eversion       (Blank rows = not tested)   LOWER EXTREMITY MMT:   not tested secondary to surgical precautions; able to do quad set, glute set and abdominal brace     GAIT: 01/04/22 Distance walked: 75 feet Assistive device utilized: no device  Level of assistance: Independent  Comments: mild antalgia, normalized pattern   FUNCTIONAL ASSESSMENTS: 12/16/2021: 3 Minute Walk Test:  450 ft with SPC 5 times sit to/from stand:  13.5 sec with hands on thighs     TODAY'S TREATMENT: 01/24/2022  Sidestepping at the bar while stepping over hurdles Rt and Lt 5 laps   Walk forward over hurdles and sidestepping x6 laps each Seated hamstring stretch 3x20 seconds  Weightshifting on mini tramp: 1 min each way with min UE support Seated hip flexion: #6 2x15 on Rt Up/down steps x 4 reps with step-over-step gait and use of 1 rail.  Good eccentric control with descending.  Stand on left and toe tap on cone with right keeping pelvis leveled and knee inline with hip Tandem stance on balance pad: 3x20 seconds   Trigger Point Dry-Needling  Treatment instructions: Expect mild to moderate muscle soreness. S/S of pneumothorax if dry needled over a lung field, and to seek immediate medical attention should they occur. Patient verbalized understanding of these instructions and education.   Patient Consent Given: Yes Education handout provided: Previously provided Muscles treated: bil lumbar multifidi Treatment response/outcome: Utilized skilled palpation to identify trigger points.  During dry needling able to palpate muscle twitch and muscle elongation  Elongation to bil low back and Rt gluteals  Skilled palpation and monitoring by PT during dry needling  01/18/2022  Sidestepping at the bar while stepping over hurdles Rt and Lt 5 laps   Walk forward over hurdles and sidestepping x6 laps each Mini squats at the bar 2x10 Seated hip flexion: 5# 2x10 on Rt Standing on balance pad: weight shifting 3 ways x1.5 min each Standing on Rt LE with tap to cone on the floor with Lt 2x10  with keeping pelvis level and engaging her abdomen and reduce shifting far to the right Stand on left and toe tap on cone with right keeping pelvis leveled and knee inline with hip Leg press seat 7  bil 60# 2x20 , single leg press: 30# Rt only 2x10 On the leg press lifting the right leg onto foot pad 10x with not letting the leg go inward Tandem stance on balance pad: 3x20 seconds   01/14/2022  Sidestepping at the bar while stepping over hurdles Rt and Lt 5 laps   Walk forward over hurdles 10x Mini squats at the bar 2x10 Standing on Rt LE with tap to cone on the floor with Lt   2x10 with keeping pelvis level and engaging her abdomen and reduce shifting far to the right Stand on left and toe tap on cone with right keeping pelvis leveled and knee inline with hip Leg press seat 7  bil 60# 2x20   On the leg press lifting the right leg onto foot pad 10x with not letting the leg go inward Trigger Point Dry-Needling  Treatment instructions: Expect mild to moderate muscle soreness. S/S of pneumothorax if dry needled over a lung field, and to seek immediate medical attention should they occur. Patient verbalized understanding of these instructions and education.  Patient Consent Given: Yes Education handout provided: Previously provided Muscles treated: distal right quads Electrical stimulation performed: No Parameters: N/A Treatment response/outcome: elongation of right quads and trigger point response.  Skilled palpation and monitoring by PT during dry needling   PATIENT EDUCATION:  Education details: current HEP Person educated: Patient Education method: Explanation and Demonstration Education comprehension: verbalized understanding and returned demonstration     HOME EXERCISE PROGRAM: Access Code: [email or web address] URL: [email or web address] Date: 11/04/2021 Prepared by: Stacy Simpson   Exercises - Supine Heel Slide  - 1 x daily - 7 x weekly - 2 sets - 10 reps - Supine Hip  Abduction  - 1 x daily - 7 x weekly - 1 sets - 5-10 reps - Supine Isometric Hip Adduction with Pillow at Knees  - 1 x daily - 7 x weekly - 1 sets - 5-10 reps - Prone Heel Squeeze  - 1 x daily - 7 x weekly - 1 sets - 5-10 reps As discussed   ASSESSMENT:   CLINICAL IMPRESSION: Pt arrived with LBP today.  Pt had a good response to DN with improved tissue mobility and reduced LBP after.  Pt was able to participate in exercise without LBP after manual therapy. Pt is now able to walk 20 minutes without significant fatigue. Pt was able to negotiate steps with good control and use of 1 rail.  Pt continues to work on pelvic alignment, symmetry and core engagement with exercise. Therapist monitoring response throughout session.     OBJECTIVE IMPAIRMENTS Abnormal gait, decreased activity tolerance, decreased mobility, difficulty walking, decreased ROM, decreased strength, impaired perceived functional ability, and pain.    ACTIVITY LIMITATIONS lifting, bending, sitting, standing, squatting, sleeping, stairs, and locomotion level   PARTICIPATION LIMITATIONS: meal prep, cleaning, laundry, driving, shopping, community activity, and occupation   PERSONAL FACTORS 1 comorbidity: previous back surgery and left hip surgery  are also affecting patient's functional outcome.    REHAB POTENTIAL: Good   CLINICAL DECISION MAKING: Stable/uncomplicated   EVALUATION COMPLEXITY: Low     GOALS: Goals reviewed with patient? Yes   SHORT TERM GOALS: Target date: 02/15/22 The patient will demonstrate knowledge of basic self care strategies and exercises to promote healing   Baseline: Goal status: Met (11/09/21)   2.  The patient will report a 50% improvement in pain levels with functional activities which are currently difficult including bending over, sitting for work Baseline: 65% better (12/23/21) Goal status:Met    3.  The patient will have full hip ROM needed for basic ADLs  Baseline:  Goal status: in  progress    4.  The patient will be able to full weight bear and ambulate household and short community distances with ease Baseline: 1 crutch now (11/18/21) Goal status: Goal Met 12/16/2021 and able to complete 3 min walk test   LONG TERM GOALS: Target date: 03/01/22   The patient will be   independent in a safe self progression of a home exercise program to promote further recovery of function   Baseline:  Goal status: In progress    2.  The patient will report a 90% improvement in hip pain levels with functional activities which are currently difficult including  Baseline: 75% (01/04/22) Goal status: In progress    3.  Improve hip strength and stability to walk on unlevel surfaces with confidence and report no loss of balance or instability  Baseline: unsteady and limited functional strength  Goal status: revised    4.  The patient will have returned to the majority of home and work ADLs including traveling for work Baseline: limited with endurance tasks Goal status: In progress    5.  FOTO score improved to 56%  Baseline: 47 Goal status: In progress                6.  Squat with ALDs including care of dog and getting objects from the floor without difficulty                         Baseline: not able to squat past chair                          Goal Status: INITIAL               7. Walk 1 mile or more for exercise and report < or = to 2/10 fatigue level                         Baseline: 5/10 with walking 1 mile                          Goal Status: NEW         PLAN: PT FREQUENCY: 2 x/week   PT DURATION: 8 weeks   PLANNED INTERVENTIONS: Therapeutic exercises, Therapeutic activity, Neuromuscular re-education, Patient/Family education, Joint mobilization, Aquatic Therapy,  Electrical stimulation, Cryotherapy, Taping,  Dry needling, Ultrasound, Ionotophoresis 71m/ml Dexamethasone, and Manual therapy   PLAN FOR NEXT SESSION: per protocol date of surgery 6/15;  work on sideplank  on knees and bridges (LEs on ball). Work on balance, stability and functional mobility.  See how low back feels.  Add Leg press back in to exercises.   KSigurd Sos PT 01/24/22 8:48 AM   [facility]Aslaska [facility] CenterSpecial[facility] [address] SPark City[place]iverdale [place] [address]Phone # [phone] [phone]

## 2022-02-04 ENCOUNTER — Ambulatory Visit: Payer: No Typology Code available for payment source | Admitting: Physical Therapy

## 2022-02-04 DIAGNOSIS — M25551 Pain in right hip: Secondary | ICD-10-CM | POA: Diagnosis not present

## 2022-02-04 DIAGNOSIS — M6281 Muscle weakness (generalized): Secondary | ICD-10-CM

## 2022-02-04 DIAGNOSIS — M5459 Other low back pain: Secondary | ICD-10-CM

## 2022-02-04 DIAGNOSIS — R262 Difficulty in walking, not elsewhere classified: Secondary | ICD-10-CM

## 2022-02-04 NOTE — Therapy (Signed)
OUTPATIENT PHYSICAL THERAPY TREATMENT NOTE   Patient Name: Kelsey [name] MRN: [id number] DOB:Feb 01, 1966, 56 y.o., female Today's Date: 02/04/2022  PCP: Cari Caraway, MD REFERRING PROVIDER: Dr. Aretha Parrot  END OF SESSION:   PT End of Session - 02/04/22 0757     Visit Number 23    Date for PT Re-Evaluation 03/01/22    Authorization Type [employer] 40 visits    Authorization - Visit Number 23    Authorization - Number of Visits 40    PT Start Time 0800    PT Stop Time 0842    PT Time Calculation (min) 42 min    Activity Tolerance Patient tolerated treatment well               Past Medical History:  Diagnosis Date   Abnormal Pap smear    Bulging lumbar disc    L4, l5--scheduled for surgery 05-04-18--Dr. Elsner   Hidradenitis    Hyperlipidemia    Infertility, female    no method of birth from late 20's through the 40's,  Headaches on OCP   Migraine    with aura   Sleep apnea 2021   Past Surgical History:  Procedure Laterality Date   ABDOMINAL EXPOSURE N/A 05/04/2018   Procedure: ABDOMINAL EXPOSURE;  Surgeon: Marty Heck, MD;  Location: [place];  Service: Vascular;  Laterality: N/A;   ANKLE SURGERY  1998/1999   left ankle fracture with ORIF   ANTERIOR LUMBAR FUSION N/A 05/04/2018   Procedure: Lumbar Four-Five Anterior  lumbar interbody fusion;  Surgeon: Kristeen Miss, MD;  Location: [place];  Service: Neurosurgery;  Laterality: N/A;  anterior   CARPAL TUNNEL RELEASE  05/21/12   right wrist   COLPOSCOPY     CIN I   HIP SURGERY Left 03-06-13   --[place] from injury   [place]  2000   off back - benign   Patient Active Problem List   Diagnosis Date Noted   Spondylolisthesis of lumbar region 05/04/2018   Degenerative disc disease, lumbar 04/17/2018   History of migraine headaches 01/28/2013   REFERRING DIAG: right hip pain M25.551   THERAPY DIAG:  right hip pain; weakness       Rationale for Evaluation and Treatment  Rehabilitation   PERTINENT HISTORY:   Right hip arthroscopic surgery October 28, 2021  see protocol  next MD follow up October 16-17  OPERATION PERFORMED: 1. Right hip arthroscopy (78469) [9.00]. 2. Synovectomy of central compartment (62952) [11.17]. 3. Synovectomy of peripheral compartment (84132) [11.17]. 4. Acetabuloplasty (44010) [15.00]. 5. Labral repair (27253) [15.00]. 6. Chondroplasty of acetabulum (66440) [11.17]. 7. Chondroplasty of femoral head (34742) [11.17]. 8. Femoroplasty (59563) [14.67]. 9. Debridement of ligamentum teres (87564) [17.37].     PRECAUTIONS:  precautions per protocol:   20 pounds foot flat partial weight bearing x 4 weeks (specific patient instructions say 2 weeks) Wean from crutches in brace weeks 3-4 Hip brace 0-90 degrees x 10 days then full ROM Brace wear 1-6 weeks as indicated by muscle control and comfort ROM limits: flexion 90 degrees x10 days, extension gentle x3 weeks; external rotation gentle x3 weeks; Internal rotation no limits; adduction <10 x6 weeks No DN   SUBJECTIVE:   My back is really tight.  The needling helped last time.   Going to [place] for the weekend.     PAIN:  Are you having pain? Yes NPRS scale: 5/10 Pain location: No hip pain, Rt>Lt low back  Aggravating with standing and walking      OBJECTIVE: (objective measures completed at initial evaluation unless otherwise dated)   DIAGNOSTIC FINDINGS: labral tear   PATIENT SURVEYS:  FOTO 35% baseline 01/04/22: 47   LOWER EXTREMITY ROM: limited per surgical precautions   passive ROM Right eval Left eval  Hip flexion 80 WFLs  Hip extension 0 WFLs  Hip abduction 5 WFLs  Hip adduction   WFLs  Hip internal rotation 10 WFLs  Hip external rotation   WFLs  Knee flexion      Knee extension      Ankle dorsiflexion      Ankle plantarflexion      Ankle inversion      Ankle eversion       (Blank rows = not tested)   LOWER EXTREMITY MMT:  not tested secondary to surgical  precautions; able to do quad set, glute set and abdominal brace     GAIT: 01/04/22 Distance walked: 75 feet Assistive device utilized: no device  Level of assistance: Independent  Comments: mild antalgia, normalized pattern   FUNCTIONAL ASSESSMENTS: 12/16/2021: 3 Minute Walk Test:  450 ft with SPC 5 times sit to/from stand:  13.5 sec with hands on thighs     TODAY'S TREATMENT: 9/22: Mini squats at the bar 15x Leg press 60# bil 20x; single leg only 30# 20x right/left  Floor sliders lateral 15x right/left  Floor sliders extensions 15x right/left  Patient demo full squat at the bar Discussed frequent piriformis and HS stretches today Manual therapy:  soft tissue mobilization to bil lumbar paraspinals, right gluteals  Trigger Point Dry-Needling  Treatment instructions: Expect mild to moderate muscle soreness. S/S of pneumothorax if dry needled over a lung field, and to seek immediate medical attention should they occur. Patient verbalized understanding of these instructions and education.   Patient Consent Given: Yes Education handout provided: Previously provided Muscles treated: bil lumbar multifidi Treatment response/outcome: Utilized skilled palpation to identify trigger points.  During dry needling able to palpate muscle twitch and muscle elongation; DN combined with ES to right and left low back and right gluteals 30 pps 8 min Elongation to bil low back and Rt gluteals  Skilled palpation and monitoring by PT during dry needling       01/24/2022  Sidestepping at the bar while stepping over hurdles Rt and Lt 5 laps   Walk forward over hurdles and sidestepping x6 laps each Seated hamstring stretch 3x20 seconds  Weightshifting on mini tramp: 1 min each way with min UE support Seated hip flexion: #6 2x15 on Rt Up/down steps x 4 reps with step-over-step gait and use of 1 rail.  Good eccentric control with descending.  Stand on left and toe tap on cone with right keeping pelvis  leveled and knee inline with hip Tandem stance on balance pad: 3x20 seconds   Trigger Point Dry-Needling  Treatment instructions: Expect mild to moderate muscle soreness. S/S of pneumothorax if dry needled over a lung field, and to seek immediate medical attention should they occur. Patient verbalized understanding of these instructions and education.   Patient Consent Given: Yes Education handout provided: Previously provided Muscles treated: bil lumbar multifidi Treatment response/outcome: Utilized skilled palpation to identify trigger points.  During dry needling able to palpate muscle twitch and muscle elongation  Elongation to bil low back and Rt gluteals  Skilled palpation and monitoring by PT during dry needling  01/18/2022  Sidestepping at the bar while stepping over hurdles Rt and Lt  5 laps   Walk forward over hurdles and sidestepping x6 laps each Mini squats at the bar 2x10 Seated hip flexion: 5# 2x10 on Rt Standing on balance pad: weight shifting 3 ways x1.5 min each Standing on Rt LE with tap to cone on the floor with Lt 2x10 with keeping pelvis level and engaging her abdomen and reduce shifting far to the right Stand on left and toe tap on cone with right keeping pelvis leveled and knee inline with hip Leg press seat 7  bil 60# 2x20 , single leg press: 30# Rt only 2x10 On the leg press lifting the right leg onto foot pad 10x with not letting the leg go inward Tandem stance on balance pad: 3x20 seconds    PATIENT EDUCATION:  Education details: current HEP Person educated: Patient Education method: Customer service manager Education comprehension: verbalized understanding and returned demonstration     HOME EXERCISE PROGRAM: Access Code: [facility] URL: [email or web address] Date: 11/04/2021 Prepared by: Ruben Im   Exercises - Supine Heel Slide  - 1 x daily - 7 x weekly - 2 sets - 10 reps - Supine Hip Abduction  - 1 x daily - 7 x weekly - 1 sets  - 5-10 reps - Supine Isometric Hip Adduction with Pillow at Knees  - 1 x daily - 7 x weekly - 1 sets - 5-10 reps - Prone Heel Squeeze  - 1 x daily - 7 x weekly - 1 sets - 5-10 reps As discussed   ASSESSMENT:   CLINICAL IMPRESSION:   On arrival pt has guarded, stiff gait pattern. "That side is happy now" post manual therapy and DN with improved trunk rotation, arm swing and hip extension with ambulation.  She is able to restart the leg press and gluteal strengthening ex's.  Therapist monitoring response to all interventions and modifying treatment accordingly.      OBJECTIVE IMPAIRMENTS Abnormal gait, decreased activity tolerance, decreased mobility, difficulty walking, decreased ROM, decreased strength, impaired perceived functional ability, and pain.    ACTIVITY LIMITATIONS lifting, bending, sitting, standing, squatting, sleeping, stairs, and locomotion level   PARTICIPATION LIMITATIONS: meal prep, cleaning, laundry, driving, shopping, community activity, and occupation   PERSONAL FACTORS 1 comorbidity: previous back surgery and left hip surgery  are also affecting patient's functional outcome.    REHAB POTENTIAL: Good   CLINICAL DECISION MAKING: Stable/uncomplicated   EVALUATION COMPLEXITY: Low     GOALS: Goals reviewed with patient? Yes   SHORT TERM GOALS: Target date: 02/15/22 The patient will demonstrate knowledge of basic self care strategies and exercises to promote healing   Baseline: Goal status: Met (11/09/21)   2.  The patient will report a 50% improvement in pain levels with functional activities which are currently difficult including bending over, sitting for work Baseline: 65% better (12/23/21) Goal status:Met    3.  The patient will have full hip ROM needed for basic ADLs  Baseline:  Goal status: in progress    4.  The patient will be able to full weight bear and ambulate household and short community distances with ease Baseline: 1 crutch now (11/18/21) Goal  status: Goal Met 12/16/2021 and able to complete 3 min walk test   LONG TERM GOALS: Target date: 03/01/22   The patient will be independent in a safe self progression of a home exercise program to promote further recovery of function   Baseline:  Goal status: In progress    2.  The patient will report a 90% improvement in  hip pain levels with functional activities which are currently difficult including  Baseline: 75% (01/04/22) Goal status: In progress    3.  Improve hip strength and stability to walk on unlevel surfaces with confidence and report no loss of balance or instability  Baseline: unsteady and limited functional strength  Goal status: revised    4.  The patient will have returned to the majority of home and work ADLs including traveling for work Baseline: limited with endurance tasks Goal status: In progress    5.  FOTO score improved to 56%  Baseline: 47 Goal status: In progress                6.  Squat with ALDs including care of dog and getting objects from the floor without difficulty                         Baseline: not able to squat past chair                          Goal Status: INITIAL               7. Walk 1 mile or more for exercise and report < or = to 2/10 fatigue level                         Baseline: 5/10 with walking 1 mile                          Goal Status: NEW         PLAN: PT FREQUENCY: 2 x/week   PT DURATION: 8 weeks   PLANNED INTERVENTIONS: Therapeutic exercises, Therapeutic activity, Neuromuscular re-education, Patient/Family education, Joint mobilization, Aquatic Therapy,  Electrical stimulation, Cryotherapy, Taping,  Dry needling, Ultrasound, Ionotophoresis 90m/ml Dexamethasone, and Manual therapy   PLAN FOR NEXT SESSION: per protocol date of surgery 6/15; sees Dr. SAretha Parrotin October;  work on sideplank on knees and bridges (LEs on ball). Work on balance, stability and functional mobility   Leg press   SRuben Im PT 02/04/22 10:10  AM Phone: [phone]: [address] [place] [place]  [id number] # [phone] [phone]

## 2022-02-10 ENCOUNTER — Ambulatory Visit: Payer: No Typology Code available for payment source

## 2022-02-10 DIAGNOSIS — M25551 Pain in right hip: Secondary | ICD-10-CM

## 2022-02-10 DIAGNOSIS — M6281 Muscle weakness (generalized): Secondary | ICD-10-CM

## 2022-02-10 DIAGNOSIS — M5459 Other low back pain: Secondary | ICD-10-CM

## 2022-02-10 DIAGNOSIS — R262 Difficulty in walking, not elsewhere classified: Secondary | ICD-10-CM

## 2022-02-10 NOTE — Therapy (Signed)
OUTPATIENT PHYSICAL THERAPY TREATMENT NOTE   Patient Name: Kelsey [name] MRN: [id number] DOB:1965/07/08, 56 y.o., female Today's Date: 02/10/2022  PCP: Cari Caraway, MD REFERRING PROVIDER: Dr. Aretha Parrot  END OF SESSION:   PT End of Session - 02/10/22 0846     Visit Number 24    Date for PT Re-Evaluation 03/01/22    Authorization Type A[employer] 40 visits    Authorization - Visit Number 24    Authorization - Number of Visits 40    PT Start Time 0802    PT Stop Time 0844    PT Time Calculation (min) 42 min    Activity Tolerance Patient tolerated treatment well    Behavior During Therapy [facility] for tasks assessed/performed                Past Medical History:  Diagnosis Date   Abnormal Pap smear    Bulging lumbar disc    L4, l5--scheduled for surgery 05-04-18--Dr. Elsner   Hidradenitis    Hyperlipidemia    Infertility, female    no method of birth from late 20's through the 40's,  Headaches on OCP   Migraine    with aura   Sleep apnea 2021   Past Surgical History:  Procedure Laterality Date   ABDOMINAL EXPOSURE N/A 05/04/2018   Procedure: ABDOMINAL EXPOSURE;  Surgeon: Marty Heck, MD;  Location: [place];  Service: Vascular;  Laterality: N/A;   ANKLE SURGERY  1998/1999   left ankle fracture with ORIF   ANTERIOR LUMBAR FUSION N/A 05/04/2018   Procedure: Lumbar Four-Five Anterior  lumbar interbody fusion;  Surgeon: Kristeen Miss, MD;  Location: [place];  Service: Neurosurgery;  Laterality: N/A;  anterior   CARPAL TUNNEL RELEASE  05/21/12   right wrist   COLPOSCOPY     CIN I   HIP SURGERY Left 03-06-13   --[place] from injury   [place]  2000   off back - benign   Patient Active Problem List   Diagnosis Date Noted   Spondylolisthesis of lumbar region 05/04/2018   Degenerative disc disease, lumbar 04/17/2018   History of migraine headaches 01/28/2013   REFERRING DIAG: right hip pain M25.551   THERAPY DIAG:  right hip pain;  weakness       Rationale for Evaluation and Treatment Rehabilitation   PERTINENT HISTORY:   Right hip arthroscopic surgery October 28, 2021  see protocol  next MD follow up October 16-17  OPERATION PERFORMED: 1. Right hip arthroscopy (40981) [9.00]. 2. Synovectomy of central compartment (19147) [11.17]. 3. Synovectomy of peripheral compartment (82956) [11.17]. 4. Acetabuloplasty (21308) [15.00]. 5. Labral repair (65784) [15.00]. 6. Chondroplasty of acetabulum (69629) [11.17]. 7. Chondroplasty of femoral head (52841) [11.17]. 8. Femoroplasty (32440) [14.67]. 9. Debridement of ligamentum teres (10272) [17.37].     PRECAUTIONS:  precautions per protocol:   20 pounds foot flat partial weight bearing x 4 weeks (specific patient instructions say 2 weeks) Wean from crutches in brace weeks 3-4 Hip brace 0-90 degrees x 10 days then full ROM Brace wear 1-6 weeks as indicated by muscle control and comfort ROM limits: flexion 90 degrees x10 days, extension gentle x3 weeks; external rotation gentle x3 weeks; Internal rotation no limits; adduction <10 x6 weeks No DN   SUBJECTIVE:   My back is really tight.  The needling helped last time.   Going to [place] for the weekend.     PAIN:  Are you having pain? Yes  NPRS scale: 2/10 Pain location: No hip pain, Distal Rt quads/knee Aggravating with standing and walking      OBJECTIVE: (objective measures completed at initial evaluation unless otherwise dated)   DIAGNOSTIC FINDINGS: labral tear   PATIENT SURVEYS:  FOTO 35% baseline 01/04/22: 47   LOWER EXTREMITY ROM: limited per surgical precautions   passive ROM Right eval Left eval  Hip flexion 80 WFLs  Hip extension 0 WFLs  Hip abduction 5 WFLs  Hip adduction   WFLs  Hip internal rotation 10 WFLs  Hip external rotation   WFLs  Knee flexion      Knee extension      Ankle dorsiflexion      Ankle plantarflexion      Ankle inversion      Ankle eversion       (Blank rows = not  tested)   LOWER EXTREMITY MMT:  not tested secondary to surgical precautions; able to do quad set, glute set and abdominal brace     GAIT: 01/04/22 Distance walked: 75 feet Assistive device utilized: no device  Level of assistance: Independent  Comments: mild antalgia, normalized pattern   FUNCTIONAL ASSESSMENTS: 12/16/2021: 3 Minute Walk Test:  450 ft with SPC 5 times sit to/from stand:  13.5 sec with hands on thighs     TODAY'S TREATMENT: 02/10/22: Mini squats at the bar 15x Leg press 60# bil 30x; single leg only 40# 20x right/left  Floor sliders lateral 15x right/left  Floor sliders extensions 15x right/left  Patient demo full squat at the bar Walking over hurdles and cobble blocks forward.  Sidestepping over hurdles  Single leg stance on green pod: 3x20 seconds on Rt Manual therapy:  soft tissue mobilization to distal quad after DN  Trigger Point Dry-Needling  Treatment instructions: Expect mild to moderate muscle soreness. S/S of pneumothorax if dry needled over a lung field, and to seek immediate medical attention should they occur. Patient verbalized understanding of these instructions and education.   Patient Consent Given: Yes Education handout provided: Previously provided Muscles treated:Rt quad ldistal Treatment response/outcome: Utilized skilled palpation to identify trigger points.  During dry needling able to palpate muscle twitch and muscle elongation; DN combined with ES to right and left low back and right gluteals 30 pps 8 min Elongation to bil low back and Rt gluteals  Skilled palpation and monitoring by PT during dry needling  9/22: Mini squats at the bar 15x Leg press 60# bil 20x; single leg only 30# 20x right/left  Floor sliders lateral 15x right/left  Floor sliders extensions 15x right/left  Patient demo full squat at the bar Discussed frequent piriformis and HS stretches today Manual therapy:  soft tissue mobilization to bil lumbar paraspinals, right  gluteals  Trigger Point Dry-Needling  Treatment instructions: Expect mild to moderate muscle soreness. S/S of pneumothorax if dry needled over a lung field, and to seek immediate medical attention should they occur. Patient verbalized understanding of these instructions and education.   Patient Consent Given: Yes Education handout provided: Previously provided Muscles treated: bil lumbar multifidi Treatment response/outcome: Utilized skilled palpation to identify trigger points.  During dry needling able to palpate muscle twitch and muscle elongation; DN combined with ES to right and left low back and right gluteals 30 pps 8 min Elongation to bil low back and Rt gluteals  Skilled palpation and monitoring by PT during dry needling    01/24/2022  Sidestepping at the bar while stepping over hurdles Rt and Lt 5 laps  Walk forward over hurdles and sidestepping x6 laps each Seated hamstring stretch 3x20 seconds  Weightshifting on mini tramp: 1 min each way with min UE support Seated hip flexion: #6 2x15 on Rt Up/down steps x 4 reps with step-over-step gait and use of 1 rail.  Good eccentric control with descending.  Stand on left and toe tap on cone with right keeping pelvis leveled and knee inline with hip Tandem stance on balance pad: 3x20 seconds   Trigger Point Dry-Needling  Treatment instructions: Expect mild to moderate muscle soreness. S/S of pneumothorax if dry needled over a lung field, and to seek immediate medical attention should they occur. Patient verbalized understanding of these instructions and education.   Patient Consent Given: Yes Education handout provided: Previously provided Muscles treated: bil lumbar multifidi Treatment response/outcome: Utilized skilled palpation to identify trigger points.  During dry needling able to palpate muscle twitch and muscle elongation  Elongation to bil low back and Rt gluteals  Skilled palpation and monitoring by PT during dry needling    PATIENT EDUCATION:  Education details: current HEP Person educated: Patient Education method: Customer service manager Education comprehension: verbalized understanding and returned demonstration     HOME EXERCISE PROGRAM: Access Code: [facility] URL: [email or web address] Date: 11/04/2021 Prepared by: Ruben Im   Exercises - Supine Heel Slide  - 1 x daily - 7 x weekly - 2 sets - 10 reps - Supine Hip Abduction  - 1 x daily - 7 x weekly - 1 sets - 5-10 reps - Supine Isometric Hip Adduction with Pillow at Knees  - 1 x daily - 7 x weekly - 1 sets - 5-10 reps - Prone Heel Squeeze  - 1 x daily - 7 x weekly - 1 sets - 5-10 reps As discussed   ASSESSMENT:   CLINICAL IMPRESSION: Pt arrived with tension in distal Rt quad and knee.  Pt did well with exercise in the clinic and did well with advancement of weight with single leg press.  Pt with minor balance deficits and is able to independently stabilize with balance exercises.  Pt with trigger points in Rt distal/lateral quads and had good response to DN and manual therapy today.  Patient will benefit from skilled PT to address the below impairments and improve overall function.    OBJECTIVE IMPAIRMENTS Abnormal gait, decreased activity tolerance, decreased mobility, difficulty walking, decreased ROM, decreased strength, impaired perceived functional ability, and pain.    ACTIVITY LIMITATIONS lifting, bending, sitting, standing, squatting, sleeping, stairs, and locomotion level   PARTICIPATION LIMITATIONS: meal prep, cleaning, laundry, driving, shopping, community activity, and occupation   PERSONAL FACTORS 1 comorbidity: previous back surgery and left hip surgery  are also affecting patient's functional outcome.    REHAB POTENTIAL: Good   CLINICAL DECISION MAKING: Stable/uncomplicated   EVALUATION COMPLEXITY: Low     GOALS: Goals reviewed with patient? Yes   SHORT TERM GOALS: Target date: 02/15/22 The  patient will demonstrate knowledge of basic self care strategies and exercises to promote healing   Baseline: Goal status: Met (11/09/21)   2.  The patient will report a 50% improvement in pain levels with functional activities which are currently difficult including bending over, sitting for work Baseline: 65% better (12/23/21) Goal status:Met    3.  The patient will have full hip ROM needed for basic ADLs  Baseline:  Goal status: in progress    4.  The patient will be able to full weight bear and ambulate household and short community distances  with ease Baseline: 1 crutch now (11/18/21) Goal status: Goal Met 12/16/2021 and able to complete 3 min walk test   LONG TERM GOALS: Target date: 03/01/22   The patient will be independent in a safe self progression of a home exercise program to promote further recovery of function   Baseline:  Goal status: In progress    2.  The patient will report a 90% improvement in hip pain levels with functional activities which are currently difficult including  Baseline: 75% (01/04/22) Goal status: In progress    3.  Improve hip strength and stability to walk on unlevel surfaces with confidence and report no loss of balance or instability  Baseline: unsteady and limited functional strength  Goal status: revised    4.  The patient will have returned to the majority of home and work ADLs including traveling for work Baseline: limited with endurance tasks Goal status: In progress    5.  FOTO score improved to 56%  Baseline: 47 Goal status: In progress                6.  Squat with ALDs including care of dog and getting objects from the floor without difficulty                         Baseline: not able to squat past chair                          Goal Status: INITIAL               7. Walk 1 mile or more for exercise and report < or = to 2/10 fatigue level                         Baseline: 5/10 with walking 1 mile                          Goal  Status: NEW         PLAN: PT FREQUENCY: 2 x/week   PT DURATION: 8 weeks   PLANNED INTERVENTIONS: Therapeutic exercises, Therapeutic activity, Neuromuscular re-education, Patient/Family education, Joint mobilization, Aquatic Therapy,  Electrical stimulation, Cryotherapy, Taping,  Dry needling, Ultrasound, Ionotophoresis 25m/ml Dexamethasone, and Manual therapy   PLAN FOR NEXT SESSION: per protocol date of surgery 6/15; sees Dr. SAretha Parrotin October;  strength, balance and endurance.   KSigurd Sos PT 02/10/22 8:53 AM  [id number] [place] [place] [address]Phone # [phone] [phone]

## 2022-02-16 ENCOUNTER — Ambulatory Visit: Payer: No Typology Code available for payment source | Attending: Orthopedic Surgery

## 2022-02-16 DIAGNOSIS — R262 Difficulty in walking, not elsewhere classified: Secondary | ICD-10-CM | POA: Diagnosis present

## 2022-02-16 DIAGNOSIS — M5459 Other low back pain: Secondary | ICD-10-CM | POA: Insufficient documentation

## 2022-02-16 DIAGNOSIS — M6281 Muscle weakness (generalized): Secondary | ICD-10-CM | POA: Insufficient documentation

## 2022-02-16 DIAGNOSIS — M25551 Pain in right hip: Secondary | ICD-10-CM | POA: Diagnosis present

## 2022-02-16 NOTE — Therapy (Signed)
OUTPATIENT PHYSICAL THERAPY TREATMENT NOTE   Patient Name: Kelsey [name] MRN: [id number] DOB:09-04-1965, 56 y.o., female Today's Date: 02/16/2022  PCP: Cari Caraway, MD REFERRING PROVIDER: Dr. Aretha Parrot  END OF SESSION:   PT End of Session - 02/16/22 0833     Visit Number 25    Date for PT Re-Evaluation 03/01/22    Authorization Type [employer] 40 visits    Authorization - Visit Number 25    Authorization - Number of Visits 40    PT Start Time 0759    PT Stop Time 0833    PT Time Calculation (min) 34 min    Activity Tolerance Patient tolerated treatment well    Behavior During Therapy [facility] for tasks assessed/performed                 Past Medical History:  Diagnosis Date   Abnormal Pap smear    Bulging lumbar disc    L4, l5--scheduled for surgery 05-04-18--Dr. Elsner   Hidradenitis    Hyperlipidemia    Infertility, female    no method of birth from late 20's through the 40's,  Headaches on OCP   Migraine    with aura   Sleep apnea 2021   Past Surgical History:  Procedure Laterality Date   ABDOMINAL EXPOSURE N/A 05/04/2018   Procedure: ABDOMINAL EXPOSURE;  Surgeon: Marty Heck, MD;  Location: [place];  Service: Vascular;  Laterality: N/A;   ANKLE SURGERY  1998/1999   left ankle fracture with ORIF   ANTERIOR LUMBAR FUSION N/A 05/04/2018   Procedure: Lumbar Four-Five Anterior  lumbar interbody fusion;  Surgeon: Kristeen Miss, MD;  Location: [place];  Service: Neurosurgery;  Laterality: N/A;  anterior   CARPAL TUNNEL RELEASE  05/21/12   right wrist   COLPOSCOPY     CIN I   HIP SURGERY Left 03-06-13   --[place] from injury   [place]  2000   off back - benign   Patient Active Problem List   Diagnosis Date Noted   Spondylolisthesis of lumbar region 05/04/2018   Degenerative disc disease, lumbar 04/17/2018   History of migraine headaches 01/28/2013   REFERRING DIAG: right hip pain M25.551   THERAPY DIAG:  right hip pain;  weakness       Rationale for Evaluation and Treatment Rehabilitation   PERTINENT HISTORY:   Right hip arthroscopic surgery October 28, 2021  see protocol  next MD follow up October 16-17  OPERATION PERFORMED: 1. Right hip arthroscopy (56387) [9.00]. 2. Synovectomy of central compartment (56433) [11.17]. 3. Synovectomy of peripheral compartment (29518) [11.17]. 4. Acetabuloplasty (84166) [15.00]. 5. Labral repair (06301) [15.00]. 6. Chondroplasty of acetabulum (60109) [11.17]. 7. Chondroplasty of femoral head (32355) [11.17]. 8. Femoroplasty (73220) [14.67]. 9. Debridement of ligamentum teres (25427) [17.37].     PRECAUTIONS:  precautions per protocol:   20 pounds foot flat partial weight bearing x 4 weeks (specific patient instructions say 2 weeks) Wean from crutches in brace weeks 3-4 Hip brace 0-90 degrees x 10 days then full ROM Brace wear 1-6 weeks as indicated by muscle control and comfort ROM limits: flexion 90 degrees x10 days, extension gentle x3 weeks; external rotation gentle x3 weeks; Internal rotation no limits; adduction <10 x6 weeks No DN   SUBJECTIVE:   Everything feels good today.    PAIN:  Are you having pain? Yes NPRS scale: 2/10 Pain location: No hip pain, Distal Rt quads/knee Aggravating with standing and  walking      OBJECTIVE: (objective measures completed at initial evaluation unless otherwise dated)   DIAGNOSTIC FINDINGS: labral tear   PATIENT SURVEYS:  FOTO 35% baseline 01/04/22: 47   LOWER EXTREMITY ROM: limited per surgical precautions   passive ROM Right eval Left eval  Hip flexion 80 WFLs  Hip extension 0 WFLs  Hip abduction 5 WFLs  Hip adduction   WFLs  Hip internal rotation 10 WFLs  Hip external rotation   WFLs  Knee flexion      Knee extension      Ankle dorsiflexion      Ankle plantarflexion      Ankle inversion      Ankle eversion       (Blank rows = not tested)   LOWER EXTREMITY MMT:  not tested secondary to surgical  precautions; able to do quad set, glute set and abdominal brace     GAIT: 01/04/22 Distance walked: 75 feet Assistive device utilized: no device  Level of assistance: Independent  Comments: mild antalgia, normalized pattern   FUNCTIONAL ASSESSMENTS: 12/16/2021: 3 Minute Walk Test:  450 ft with SPC 5 times sit to/from stand:  13.5 sec with hands on thighs     TODAY'S TREATMENT: 02/16/22: Mini squats at the bar blue band around thighs x20 Leg press 60# bil 30x; single leg only 40# 20x right/left  Floor sliders lateral 15x right/left  Floor sliders extensions 15x right/left  Side stepping blue band around thighs at barre x5 laps  Sit to stand: band around thighs 10# kettlebell 2x10 Walking over hurdles and cobble blocks forward.  Sidestepping over hurdles  8" step-ups 2x10 Single leg stance on green pod: 3x20 seconds on Rt Tandem stance on balance pad: 3x20 seconds   02/10/22: Mini squats at the bar 15x Leg press 60# bil 30x; single leg only 40# 20x right/left  Floor sliders lateral 15x right/left  Floor sliders extensions 15x right/left  Patient demo full squat at the bar Walking over hurdles and cobble blocks forward.  Sidestepping over hurdles  Single leg stance on green pod: 3x20 seconds on Rt Manual therapy:  soft tissue mobilization to distal quad after DN  Trigger Point Dry-Needling  Treatment instructions: Expect mild to moderate muscle soreness. S/S of pneumothorax if dry needled over a lung field, and to seek immediate medical attention should they occur. Patient verbalized understanding of these instructions and education.   Patient Consent Given: Yes Education handout provided: Previously provided Muscles treated:Rt quad ldistal Treatment response/outcome: Utilized skilled palpation to identify trigger points.  During dry needling able to palpate muscle twitch and muscle elongation; DN combined with ES to right and left low back and right gluteals 30 pps 8  min Elongation to bil low back and Rt gluteals  Skilled palpation and monitoring by PT during dry needling  9/22: Mini squats at the bar 15x Leg press 60# bil 20x; single leg only 30# 20x right/left  Floor sliders lateral 15x right/left  Floor sliders extensions 15x right/left  Patient demo full squat at the bar Discussed frequent piriformis and HS stretches today Manual therapy:  soft tissue mobilization to bil lumbar paraspinals, right gluteals  Trigger Point Dry-Needling  Treatment instructions: Expect mild to moderate muscle soreness. S/S of pneumothorax if dry needled over a lung field, and to seek immediate medical attention should they occur. Patient verbalized understanding of these instructions and education.   Patient Consent Given: Yes Education handout provided: Previously provided Muscles treated: bil lumbar multifidi Treatment  response/outcome: Utilized skilled palpation to identify trigger points.  During dry needling able to palpate muscle twitch and muscle elongation; DN combined with ES to right and left low back and right gluteals 30 pps 8 min Elongation to bil low back and Rt gluteals  Skilled palpation and monitoring by PT during dry needling    PATIENT EDUCATION:  Education details: current HEP Person educated: Patient Education method: Customer service manager Education comprehension: verbalized understanding and returned demonstration     HOME EXERCISE PROGRAM: Access Code: [facility] URL: [email or web address] Date: 11/04/2021 Prepared by: Ruben Im   Exercises - Supine Heel Slide  - 1 x daily - 7 x weekly - 2 sets - 10 reps - Supine Hip Abduction  - 1 x daily - 7 x weekly - 1 sets - 5-10 reps - Supine Isometric Hip Adduction with Pillow at Knees  - 1 x daily - 7 x weekly - 1 sets - 5-10 reps - Prone Heel Squeeze  - 1 x daily - 7 x weekly - 1 sets - 5-10 reps As discussed   ASSESSMENT:   CLINICAL IMPRESSION: Pt arrived without  pain today.  Pt is able to walk on uneven surfaces and hills while in [place] without difficulty. She did need to take rest breaks due to functional strength deficits.  Pt did well with balance and strength tasks in the clinic today and requires minor tactile cues Patient will benefit from skilled PT to address the below impairments and improve overall function.    OBJECTIVE IMPAIRMENTS Abnormal gait, decreased activity tolerance, decreased mobility, difficulty walking, decreased ROM, decreased strength, impaired perceived functional ability, and pain.    ACTIVITY LIMITATIONS lifting, bending, sitting, standing, squatting, sleeping, stairs, and locomotion level   PARTICIPATION LIMITATIONS: meal prep, cleaning, laundry, driving, shopping, community activity, and occupation   PERSONAL FACTORS 1 comorbidity: previous back surgery and left hip surgery  are also affecting patient's functional outcome.    REHAB POTENTIAL: Good   CLINICAL DECISION MAKING: Stable/uncomplicated   EVALUATION COMPLEXITY: Low     GOALS: Goals reviewed with patient? Yes   SHORT TERM GOALS: Target date: 02/15/22 The patient will demonstrate knowledge of basic self care strategies and exercises to promote healing   Baseline: Goal status: Met (11/09/21)   2.  The patient will report a 50% improvement in pain levels with functional activities which are currently difficult including bending over, sitting for work Baseline: 65% better (12/23/21) Goal status:Met    3.  The patient will have full hip ROM needed for basic ADLs  Baseline:  Goal status: in progress    4.  The patient will be able to full weight bear and ambulate household and short community distances with ease Baseline: 1 crutch now (11/18/21) Goal status: Goal Met 12/16/2021 and able to complete 3 min walk test   LONG TERM GOALS: Target date: 03/01/22   The patient will be independent in a safe self progression of a home exercise program to promote  further recovery of function   Baseline:  Goal status: In progress    2.  The patient will report a 90% improvement in hip pain levels with functional activities which are currently difficult including  Baseline: 75% (01/04/22) Goal status: In progress    3.  Improve hip strength and stability to walk on unlevel surfaces with confidence and report no loss of balance or instability  Baseline: no loss of balance but reports fatigue (02/16/22)  Goal status: in progress   4.  The patient will have returned to the majority of home and work ADLs including traveling for work Baseline: limited with endurance tasks Goal status: In progress    5.  FOTO score improved to 56%  Baseline: 47 Goal status: In progress                6.  Squat with ALDs including care of dog and getting objects from the floor without difficulty                         Baseline: not able to squat past chair                          Goal Status: INITIAL               7. Walk 1 mile or more for exercise and report < or = to 2/10 fatigue level                         Baseline: 5/10 with walking 1 mile                          Goal Status: NEW         PLAN: PT FREQUENCY: 2 x/week   PT DURATION: 8 weeks   PLANNED INTERVENTIONS: Therapeutic exercises, Therapeutic activity, Neuromuscular re-education, Patient/Family education, Joint mobilization, Aquatic Therapy,  Electrical stimulation, Cryotherapy, Taping,  Dry needling, Ultrasound, Ionotophoresis 68m/ml Dexamethasone, and Manual therapy   PLAN FOR NEXT SESSION: per protocol date of surgery 6/15; sees Dr. SAretha Parrotin October;  strength, balance and endurance.   KSigurd Sos PT 02/16/22 8:34 AM  [facility] [address] [place]ville[place]  [address]Phone # [phone] [phone]

## 2022-02-22 ENCOUNTER — Ambulatory Visit: Payer: No Typology Code available for payment source | Admitting: Physical Therapy

## 2022-02-22 DIAGNOSIS — M5459 Other low back pain: Secondary | ICD-10-CM

## 2022-02-22 DIAGNOSIS — M6281 Muscle weakness (generalized): Secondary | ICD-10-CM

## 2022-02-22 DIAGNOSIS — R262 Difficulty in walking, not elsewhere classified: Secondary | ICD-10-CM

## 2022-02-22 DIAGNOSIS — M25551 Pain in right hip: Secondary | ICD-10-CM

## 2022-02-22 NOTE — Therapy (Signed)
OUTPATIENT PHYSICAL THERAPY TREATMENT NOTE/DISCHARGE SUMMARY   Patient Name: Kelsey [name] MRN: [id number] DOB:May 05, 1966, 56 y.o., female Today's Date: 02/22/2022  PCP: Cari Caraway, MD REFERRING PROVIDER: Dr. Aretha Parrot  END OF SESSION:   PT End of Session - 02/22/22 0752     Visit Number 26    Date for PT Re-Evaluation 02/22/22    Authorization Type [employer] 40 visits    Authorization - Visit Number 46    Authorization - Number of Visits 40    PT Start Time 0755    PT Stop Time 0840    PT Time Calculation (min) 45 min    Activity Tolerance Patient tolerated treatment well                 Past Medical History:  Diagnosis Date   Abnormal Pap smear    Bulging lumbar disc    L4, l5--scheduled for surgery 05-04-18--Dr. Elsner   Hidradenitis    Hyperlipidemia    Infertility, female    no method of birth from late 20's through the 40's,  Headaches on OCP   Migraine    with aura   Sleep apnea 2021   Past Surgical History:  Procedure Laterality Date   ABDOMINAL EXPOSURE N/A 05/04/2018   Procedure: ABDOMINAL EXPOSURE;  Surgeon: Marty Heck, MD;  Location: [place];  Service: Vascular;  Laterality: N/A;   ANKLE SURGERY  1998/1999   left ankle fracture with ORIF   ANTERIOR LUMBAR FUSION N/A 05/04/2018   Procedure: Lumbar Four-Five Anterior  lumbar interbody fusion;  Surgeon: Kristeen Miss, MD;  Location: [place];  Service: Neurosurgery;  Laterality: N/A;  anterior   CARPAL TUNNEL RELEASE  05/21/12   right wrist   COLPOSCOPY     CIN I   HIP SURGERY Left 03-06-13   --[place] from injury   [place]  2000   off back - benign   Patient Active Problem List   Diagnosis Date Noted   Spondylolisthesis of lumbar region 05/04/2018   Degenerative disc disease, lumbar 04/17/2018   History of migraine headaches 01/28/2013   REFERRING DIAG: right hip pain M25.551   THERAPY DIAG:  right hip pain; weakness       Rationale for Evaluation  and Treatment Rehabilitation   PERTINENT HISTORY:   Right hip arthroscopic surgery October 28, 2021  see protocol  next MD follow up October 16-17  OPERATION PERFORMED: 1. Right hip arthroscopy (69485) [9.00]. 2. Synovectomy of central compartment (46270) [11.17]. 3. Synovectomy of peripheral compartment (35009) [11.17]. 4. Acetabuloplasty (38182) [15.00]. 5. Labral repair (99371) [15.00]. 6. Chondroplasty of acetabulum (69678) [11.17]. 7. Chondroplasty of femoral head (93810) [11.17]. 8. Femoroplasty (17510) [14.67]. 9. Debridement of ligamentum teres (25852) [17.37].       SUBJECTIVE:   My hip is doing well.  Rates overall progress at 85-90% better.   I did some bending and squatting.  It wasn't the hip that bothered me it's my back.   I can walk a mile with ease.    PAIN:  Are you having pain? no NPRS scale: 0/10    OBJECTIVE: (objective measures completed at initial evaluation unless otherwise dated)   DIAGNOSTIC FINDINGS: labral tear   PATIENT SURVEYS:  FOTO 35% baseline 01/04/22: 47 10/10:   84%      passive ROM Right eval Left eval 10/10  Hip flexion 80 WFLs 100  Hip extension 0 WFLs 5  Hip abduction 5 WFLs  25  Hip adduction   WFLs   Hip internal rotation 10 WFLs WFLs  Hip external rotation   WFLs WFLs  Knee flexion       Knee extension       Ankle dorsiflexion       Ankle plantarflexion       Ankle inversion       Ankle eversion        (Blank rows = not tested)   LOWER EXTREMITY MMT:  not tested secondary to surgical precautions; able to do quad set, glute set and abdominal brace     GAIT: 01/04/22 Distance walked: 75 feet Assistive device utilized: no device  Level of assistance: Independent  Comments: mild antalgia, normalized pattern   FUNCTIONAL ASSESSMENTS: 12/16/2021: 3 Minute Walk Test:  450 ft with SPC 5 times sit to/from stand:  13.5 sec with hands on thighs  10/10:  6 MIN WALK TEST 1220 5X sit to stand 9 sec no UE use   TODAY'S  TREATMENT: 10/10: FOTO 6 min walk test 5x sit to stand Leg press 70# bil 20x; right leg only 70# 10x;  single leg only 40# 10x right/left  Manual therapy:  soft tissue mobilization to right upper gluteals Trigger Point Dry-Needling  Treatment instructions: Expect mild to moderate muscle soreness. S/S of pneumothorax if dry needled over a lung field, and to seek immediate medical attention should they occur. Patient verbalized understanding of these instructions and education.   Patient Consent Given: Yes Education handout provided: Previously provided Muscles treated:Rt quad ldistal Treatment response/outcome: Utilized skilled palpation to identify trigger points.  During dry needling able to palpate muscle twitch and muscle elongation; DN combined with ES to right gluteals 30 pps 5 min  Rt gluteals  Skilled palpation and monitoring by PT during dry needling       02/16/22: Mini squats at the bar blue band around thighs x20 Leg press 60# bil 30x; single leg only 40# 20x right/left  Floor sliders lateral 15x right/left  Floor sliders extensions 15x right/left  Side stepping blue band around thighs at barre x5 laps  Sit to stand: band around thighs 10# kettlebell 2x10 Walking over hurdles and cobble blocks forward.  Sidestepping over hurdles  8" step-ups 2x10 Single leg stance on green pod: 3x20 seconds on Rt Tandem stance on balance pad: 3x20 seconds    PATIENT EDUCATION:  Education details: current HEP Person educated: Patient Education method: Customer service manager Education comprehension: verbalized understanding and returned demonstration     HOME EXERCISE PROGRAM: Access Code: [email or web address] URL: [email or web address] Date: 11/04/2021 Prepared by: Ruben Im   Exercises - Supine Heel Slide  - 1 x daily - 7 x weekly - 2 sets - 10 reps - Supine Hip Abduction  - 1 x daily - 7 x weekly - 1 sets - 5-10 reps - Supine Isometric Hip Adduction with  Pillow at Knees  - 1 x daily - 7 x weekly - 1 sets - 5-10 reps - Prone Heel Squeeze  - 1 x daily - 7 x weekly - 1 sets - 5-10 reps As discussed   ASSESSMENT:   CLINICAL IMPRESSION: The patient reports her hip is 85-90% better.  She reports she is doing very well and walking community distances without an assistive device.  The patient has met tall rehab goals, with noted improvements in pain reduction, outcome score, ROM, strength and functional mobility.  A comprehensive HEP has been established and anticipate further improvements over time with  regular performance of the program.  Recommend discharge from PT for her hip at this time.      OBJECTIVE IMPAIRMENTS Abnormal gait, decreased activity tolerance, decreased mobility, difficulty walking, decreased ROM, decreased strength, impaired perceived functional ability, and pain.    ACTIVITY LIMITATIONS lifting, bending, sitting, standing, squatting, sleeping, stairs, and locomotion level   PARTICIPATION LIMITATIONS: meal prep, cleaning, laundry, driving, shopping, community activity, and occupation   PERSONAL FACTORS 1 comorbidity: previous back surgery and left hip surgery  are also affecting patient's functional outcome.    REHAB POTENTIAL: Good   CLINICAL DECISION MAKING: Stable/uncomplicated   EVALUATION COMPLEXITY: Low     GOALS: Goals reviewed with patient? Yes   SHORT TERM GOALS: Target date: 02/15/22 The patient will demonstrate knowledge of basic self care strategies and exercises to promote healing   Baseline: Goal status: Met (11/09/21)   2.  The patient will report a 50% improvement in pain levels with functional activities which are currently difficult including bending over, sitting for work Baseline: 65% better (12/23/21) Goal status:Met    3.  The patient will have full hip ROM needed for basic ADLs  Baseline:  Goal status: in progress    4.  The patient will be able to full weight bear and ambulate household  and short community distances with ease Baseline: 1 crutch now (11/18/21) Goal status: Goal Met 12/16/2021 and able to complete 3 min walk test   LONG TERM GOALS: Target date: 04/19/22  The patient will be independent in a safe self progression of a home exercise program to promote further recovery of function   Baseline:  Goal status: goal met    2.  The patient will report a 90% improvement in hip pain levels with functional activities which are currently difficult including  Baseline: 75% (01/04/22) Goal status: goal met   3.  Improve hip strength and stability to walk on unlevel surfaces with confidence and report no loss of balance or instability  Baseline: no loss of balance but reports fatigue (02/16/22)  Goal status: goal met   4.  The patient will have returned to the majority of home and work ADLs including traveling for work Baseline: limited with endurance tasks Goal status: goal met   5.  FOTO score improved to 56%  Baseline: 47 Goal status: goal met                6.  Squat with ALDs including care of dog and getting objects from the floor without difficulty                         Baseline: not able to squat past chair                          Goal Status: goal met               7. Walk 1 mile or more for exercise and report < or = to 2/10 fatigue level                         Baseline: 5/10 with walking 1 mile                          Goal Status goal met       PLAN: PT FREQUENCY: 2 x/week   PT DURATION: 8 weeks  PLANNED INTERVENTIONS: Therapeutic exercises, Therapeutic activity, Neuromuscular re-education, Patient/Family education, Joint mobilization, Aquatic Therapy,  Electrical stimulation, Cryotherapy, Taping,  Dry needling, Ultrasound, Ionotophoresis 15m/ml Dexamethasone, and Manual therapy   PLAN FOR NEXT SESSION: per protocol date of surgery 6/15; sees Dr. SAretha Parrotin October;  strength, balance and endurance.     PHYSICAL THERAPY DISCHARGE  SUMMARY  Visits from Start of Care: 26  Current functional level related to goals / functional outcomes: See clinical impressions   Remaining deficits: See clinical impressions above   Education / Equipment: HEP   Patient agrees to discharge. Patient goals were met. Patient is being discharged due to meeting the stated rehab goals.   SRuben Im PT 02/22/22 12:26 PM Phone: [phone]: [phone] Community Hospital[address] [place] [place]  [address]Phone # [phone] [phone]

## 2022-05-06 ENCOUNTER — Other Ambulatory Visit: Payer: Self-pay | Admitting: Obstetrics and Gynecology

## 2022-05-06 DIAGNOSIS — Z1231 Encounter for screening mammogram for malignant neoplasm of breast: Secondary | ICD-10-CM

## 2022-05-19 ENCOUNTER — Ambulatory Visit: Payer: No Typology Code available for payment source | Admitting: Rehabilitative and Restorative Service Providers"

## 2022-05-19 NOTE — Therapy (Signed)
OUTPATIENT PHYSICAL THERAPY THORACOLUMBAR EVALUATION   Patient Name: Kelsey [name] MRN: [id number] DOB:[birth year], 57 y.o., female Today's Date: 05/20/2022  END OF SESSION:  PT End of Session - 05/20/22 1225     Visit Number 1    Number of Visits 12    Date for PT Re-Evaluation 08/19/22    Authorization Type [place] PREFERRED    PT Start Time 0802    PT Stop Time 0850    PT Time Calculation (min) 48 min    Activity Tolerance Patient tolerated treatment well    Behavior During Therapy [facility] for tasks assessed/performed             Past Medical History:  Diagnosis Date   Abnormal Pap smear    Bulging lumbar disc    L4, l5--scheduled for surgery 05-04-18--Dr. Elsner   Hidradenitis    Hyperlipidemia    Infertility, female    no method of birth from late 20's through the 40's,  Headaches on OCP   Migraine    with aura   Sleep apnea 2021   Past Surgical History:  Procedure Laterality Date   ABDOMINAL EXPOSURE N/A 05/04/2018   Procedure: ABDOMINAL EXPOSURE;  Surgeon: Marty Heck, MD;  Location: [facility];  Service: Vascular;  Laterality: N/A;   ANKLE SURGERY  1998/1999   left ankle fracture with ORIF   ANTERIOR LUMBAR FUSION N/A 05/04/2018   Procedure: Lumbar Four-Five Anterior  lumbar interbody fusion;  Surgeon: Kristeen Miss, MD;  Location: [facility];  Service: Neurosurgery;  Laterality: N/A;  anterior   CARPAL TUNNEL RELEASE  05/21/12   right wrist   COLPOSCOPY     CIN I   HIP SURGERY Left 03-06-13   --[place] from injury   [place]   off back - benign   Patient Active Problem List   Diagnosis Date Noted   Spondylolisthesis of lumbar region 05/04/2018   Degenerative disc disease, lumbar 04/17/2018   History of migraine headaches 01/28/2013     REFERRING PROVIDER: Chyrl Civatte Thane Edu, PA-C   REFERRING DIAG: Spinal stenosis, lumbar region with neurogenic claudication [M48.062]   Rationale for Evaluation and  Treatment: Rehabilitation  THERAPY DIAG:  Muscle weakness (generalized) - Plan: PT plan of care cert/re-cert  Difficulty in walking, not elsewhere classified - Plan: PT plan of care cert/re-cert  Other low back pain - Plan: PT plan of care cert/re-cert  ONSET DATE: December 2023  SUBJECTIVE:  SUBJECTIVE STATEMENT: Pt reports back to PT with continued chronic lower back pain. She initially had surgery in 2016 that helped modulate her lumbar pain, which returned after a recent hip labral repair in 2023. Pt reports having continued numbness in her R thigh resulting in an antalgic gait pattern.   PERTINENT HISTORY:  Bulging disc (lumbar), Migraines.   PAIN:  Are you having pain? Yes: NPRS scale: 4/10 Pain location: R lower lumbar  Pain description: Isolated trigger point, nagging pain Aggravating factors: Sitting, vacuuming  Relieving factors: Rest   PRECAUTIONS: None  WEIGHT BEARING RESTRICTIONS: No  FALLS:  Has patient fallen in last 6 months? No  LIVING ENVIRONMENT: Lives with: lives with their spouse Lives in: House/apartment Stairs: Yes: Internal: 12 steps; on right going up Has following equipment at home: None  OCCUPATION: Hotel manager for [employer]  PLOF: Independent  PATIENT GOALS: Pt would like to reduce lower back pain.   NEXT MD VISIT:   OBJECTIVE:   DIAGNOSTIC FINDINGS:  None   PATIENT SURVEYS:  FOTO 50.79%, 62% goal  SCREENING FOR RED FLAGS: Bowel or bladder incontinence: No  COGNITION: Overall cognitive status: Within functional limits for tasks assessed   SENSATION: WFL  POSTURE: No Significant postural limitations  PALPATION: Tenderness isolated to R multifidi and paraspinals (L3-L5).   LUMBAR ROM:   AROM eval  Flexion 75%  Extension 100%  Right lateral  flexion 100%  Left lateral flexion 100%  Right rotation 100%  Left rotation 100%   (Blank rows = not tested)  LOWER EXTREMITY ROM:     Active  Right eval Left eval  Hip flexion [facility]  Hip internal rotation [facility]  Hip external rotation [facility]  Knee flexion [facility] WFL  Knee extension WFL WFL   (Blank rows = not tested)  LOWER EXTREMITY MMT:    Pt has functional strength, but has weakness on R LE> L LE.    GAIT: 05/20/2022 Distance walked: 40ft  Assistive device utilized: no device  Level of assistance: Independent  Comments: mild antalgia, normalized  TODAY'S TREATMENT:                                                                                                                              DATE: Creating, reviewing, and completing below HEP.  Trigger Point Dry-Needling  Treatment instructions: Expect mild to moderate muscle soreness. S/S of pneumothorax if dry needled over a lung field, and to seek immediate medical attention should they occur. Patient verbalized understanding of these instructions and education.  Patient Consent Given: Yes Education handout provided: No Muscles treated: R multifdi, and paraspinals.  Electrical stimulation performed: No Parameters: N/A Treatment response/outcome: Decreased pain.     PATIENT EDUCATION:  Education details: Educated pt on anatomy and physiology of current symptoms, FOTO, diagnosis, prognosis, HEP,  and POC. Person educated: Patient Education method: Customer service manager Education comprehension: verbalized understanding and returned demonstration  HOME EXERCISE PROGRAM: Prayer  stretch 2x30 sec  PPT 2x30 sec hold.    ASSESSMENT:  CLINICAL IMPRESSION: Patient referred to PT for chronic lower back pain. Patient will benefit from skilled PT to address below impairments, limitations and improve overall function.  OBJECTIVE IMPAIRMENTS: decreased activity tolerance, difficulty walking, decreased balance,  decreased endurance, decreased mobility, decreased ROM, decreased strength, impaired flexibility, impaired UE/LE use, postural dysfunction, and pain.  ACTIVITY LIMITATIONS: bending, lifting, carry, locomotion, cleaning, community activity, driving, and or occupation  PERSONAL FACTORS: Bulging disc (lumbar), Migraines. are also affecting patient's functional outcome.  REHAB POTENTIAL: Good  CLINICAL DECISION MAKING: Stable/uncomplicated  EVALUATION COMPLEXITY: Low    GOALS: Short term PT Goals Target date: 06/03/2022 Pt will be I and compliant with HEP. Baseline:  Goal status: New Pt will decrease pain by 25% overall Baseline: Goal status: New  Long term PT goals Target date: 08/12/2022 Pt will walk with improved gait pattern with no reported pain in her R lower back.        Baseline:        Goal status: New       2. Pt will report self management of lower back symptoms <2/10                   Baseline:       Goal status: New   PLAN: PT FREQUENCY: Pt plans to come in as needed for DN  PT DURATION: 12 weeks  PLANNED INTERVENTIONS (unless contraindicated): aquatic PT, Canalith repositioning, cryotherapy, Electrical stimulation, Iontophoresis with 4 mg/ml dexamethasome, Moist heat, traction, Ultrasound, gait training, Therapeutic exercise, balance training, neuromuscular re-education, patient/family education, prosthetic training, manual techniques, passive ROM, dry needling, taping, vasopnuematic device, vestibular, spinal manipulations, joint manipulations  PLAN FOR NEXT SESSION: DN PRN with estim, update HEP PRN.    Lynden Ang, PT 05/20/2022, 12:29 PM

## 2022-05-20 ENCOUNTER — Ambulatory Visit: Payer: No Typology Code available for payment source | Attending: Neurosurgery | Admitting: Physical Therapy

## 2022-05-20 ENCOUNTER — Encounter: Payer: Self-pay | Admitting: Physical Therapy

## 2022-05-20 DIAGNOSIS — M6281 Muscle weakness (generalized): Secondary | ICD-10-CM | POA: Insufficient documentation

## 2022-05-20 DIAGNOSIS — M5459 Other low back pain: Secondary | ICD-10-CM | POA: Diagnosis present

## 2022-05-20 DIAGNOSIS — R262 Difficulty in walking, not elsewhere classified: Secondary | ICD-10-CM | POA: Diagnosis present

## 2022-06-24 ENCOUNTER — Encounter: Payer: Self-pay | Admitting: Rehabilitative and Restorative Service Providers"

## 2022-06-24 ENCOUNTER — Ambulatory Visit
Payer: No Typology Code available for payment source | Attending: Neurosurgery | Admitting: Rehabilitative and Restorative Service Providers"

## 2022-06-24 DIAGNOSIS — R262 Difficulty in walking, not elsewhere classified: Secondary | ICD-10-CM | POA: Diagnosis present

## 2022-06-24 DIAGNOSIS — M5459 Other low back pain: Secondary | ICD-10-CM | POA: Diagnosis present

## 2022-06-24 DIAGNOSIS — M6281 Muscle weakness (generalized): Secondary | ICD-10-CM | POA: Insufficient documentation

## 2022-06-24 NOTE — Patient Instructions (Signed)

## 2022-06-24 NOTE — Therapy (Signed)
OUTPATIENT PHYSICAL THERAPY THORACOLUMBAR EVALUATION   Patient Name: Kelsey [name] MRN: [id number] DOB:01/31/1966, 57 y.o., female Today's Date: 06/24/2022  END OF SESSION:  PT End of Session - 06/24/22 1059     Visit Number 2    Date for PT Re-Evaluation 08/19/22    Authorization Type [place] PREFERRED    PT Start Time 1058    PT Stop Time 1138    PT Time Calculation (min) 40 min    Activity Tolerance Patient tolerated treatment well    Behavior During Therapy WFL for tasks assessed/performed             Past Medical History:  Diagnosis Date   Abnormal Pap smear    Bulging lumbar disc    L4, l5--scheduled for surgery 05-04-18--Dr. Elsner   Hidradenitis    Hyperlipidemia    Infertility, female    no method of birth from late 20's through the 40's,  Headaches on OCP   Migraine    with aura   Sleep apnea 2021   Past Surgical History:  Procedure Laterality Date   ABDOMINAL EXPOSURE N/A 05/04/2018   Procedure: ABDOMINAL EXPOSURE;  Surgeon: Marty Heck, MD;  Location: [place];  Service: Vascular;  Laterality: N/A;   ANKLE SURGERY  1998/1999   left ankle fracture with ORIF   ANTERIOR LUMBAR FUSION N/A 05/04/2018   Procedure: Lumbar Four-Five Anterior  lumbar interbody fusion;  Surgeon: Kristeen Miss, MD;  Location: [place];  Service: Neurosurgery;  Laterality: N/A;  anterior   CARPAL TUNNEL RELEASE  05/21/12   right wrist   COLPOSCOPY     CIN I   HIP SURGERY Left 03-06-13   --[place] from injury   [place]   off back - benign   Patient Active Problem List   Diagnosis Date Noted   Spondylolisthesis of lumbar region 05/04/2018   Degenerative disc disease, lumbar 04/17/2018   History of migraine headaches 01/28/2013     REFERRING PROVIDER: Chyrl Civatte Thane Edu, PA-C   REFERRING DIAG: Spinal stenosis, lumbar region with neurogenic claudication [M48.062]   Rationale for Evaluation and Treatment:  Rehabilitation  THERAPY DIAG:  Muscle weakness (generalized)  Difficulty in walking, not elsewhere classified  Other low back pain  ONSET DATE: December 2023  SUBJECTIVE:                                                                                                                                                                                           SUBJECTIVE STATEMENT: Pt reports that after her last dry needling session, it  helped her for at least 2.5 weeks.  She states that after her trip out of [place] and being on the plane and sitting in conference rooms, she was starting to have more pain.  PERTINENT HISTORY:  Bulging disc (lumbar), Migraines.   PAIN:  Are you having pain? Yes: NPRS scale: 4/10 Pain location: R lower lumbar  Pain description: Isolated trigger point, nagging pain Aggravating factors: Sitting, vacuuming  Relieving factors: Rest   PRECAUTIONS: None  WEIGHT BEARING RESTRICTIONS: No  FALLS:  Has patient fallen in last 6 months? No  LIVING ENVIRONMENT: Lives with: lives with their spouse Lives in: House/apartment Stairs: Yes: Internal: 12 steps; on right going up Has following equipment at home: None  OCCUPATION: Hotel manager for [employer]  PLOF: Independent  PATIENT GOALS: Pt would like to reduce lower back pain.   NEXT MD VISIT:   OBJECTIVE:   DIAGNOSTIC FINDINGS:  None   PATIENT SURVEYS:  Eval:  FOTO 50.79%, 62% goal   POSTURE: No Significant postural limitations  PALPATION: Tenderness isolated to R multifidi and paraspinals (L3-L5).   LUMBAR ROM:   AROM eval  Flexion 75%  Extension 100%  Right lateral flexion 100%  Left lateral flexion 100%  Right rotation 100%  Left rotation 100%   (Blank rows = not tested)  LOWER EXTREMITY ROM:     Active  Right eval Left eval  Hip flexion [facility]  Hip internal rotation [facility]  Hip external rotation [facility]  Knee flexion [facility] WFL  Knee extension WFL WFL   (Blank rows = not  tested)  LOWER EXTREMITY MMT:    Pt has functional strength, but has weakness on R LE> L LE.    GAIT: 05/20/2022 Distance walked: 68f  Assistive device utilized: no device  Level of assistance: Independent  Comments: mild antalgia, normalized  TODAY'S TREATMENT:                                                                                                                               DATE: 06/24/2022 Nustep level 1 x6 min with PT present to discuss status Standing hamstring stretch at stairs 2x20 sec bilat Supine posterior pelvic tilt 2x10 Supine hooklying with isometric press into ball for TA contraction.  2x10 Lower trunk rotation with LE on red pball 2x10 Double knees to chest with LE on red pball 2x10 Prone on elbows x2 min Prone press up 2x10 Prone hip extension 2x10 bilat Trigger Point Dry-Needling  Treatment instructions: Expect mild to moderate muscle soreness. S/S of pneumothorax if dry needled over a lung field, and to seek immediate medical attention should they occur. Patient verbalized understanding of these instructions and education. Patient Consent Given: Yes Education handout provided: Yes Muscles treated: right lumbar multifidi, right glutes, right piriformis Electrical stimulation performed: No Parameters: N/A Treatment response/outcome: Utilized skilled palpation to locate/identify trigger points.  Able to illicit twitch response and muscle elongation following. Manual Therapy:  Soft  tissue mobilization to bilateral lumbar paraspinals and right glutes/piriformis.    DATE:  Eval  Creating, reviewing, and completing below HEP.  Trigger Point Dry-Needling  Treatment instructions: Expect mild to moderate muscle soreness. S/S of pneumothorax if dry needled over a lung field, and to seek immediate medical attention should they occur. Patient verbalized understanding of these instructions and education.  Patient Consent Given: Yes Education handout provided:  No Muscles treated: R multifdi, and paraspinals.  Electrical stimulation performed: No Parameters: N/A Treatment response/outcome: Decreased pain.     PATIENT EDUCATION:  Education details: Educated pt on anatomy and physiology of current symptoms, FOTO, diagnosis, prognosis, HEP,  and POC. Person educated: Patient Education method: Customer service manager Education comprehension: verbalized understanding and returned demonstration  HOME EXERCISE PROGRAM: Access Code: [email or web address] URL: [email or web address] Date: 06/24/2022 Prepared by: Shelby Dubin Zsazsa Bahena  Exercises - Seated Abdominal Press into [employer]  - 1 x daily - 7 x weekly - 2 sets - 10 reps - Supine Posterior Pelvic Tilt  - 1 x daily - 7 x weekly - 2 sets - 10 reps - Supine Lower Trunk Rotation with Swiss Ball  - 1 x daily - 7 x weekly - 2 sets - 10 reps - Supine Hip and Knee Flexion AROM with Swiss Ball  - 1 x daily - 7 x weekly - 2 sets - 10 reps - Static Prone on Elbows  - 1 x daily - 7 x weekly - 1 sets - 2 reps - 1-2 min hold - Prone Press Up  - 1 x daily - 7 x weekly - 2 sets - 10 reps - Prone Hip Extension  - 1 x daily - 7 x weekly - 2 sets - 10 reps - Child's Pose Stretch  - 1 x daily - 7 x weekly - 1 sets - 2 reps - 20 sec hold   ASSESSMENT:  CLINICAL IMPRESSION:  Ms Doporto presents to skilled PT with reporting that she is feeling at least 40% better since starting PT.  Patient had a great relief of her pain with use of dry needling at initial evaluation.  Patient with a good response to stretching exercises and core stabilization.  Patient has a decrease in pain noted with prone on elbows and prone press-up exercises.  Progressed HEP and provided pt with handout of new exercises.  Patient continues with further positive response to dry needling reporting by completion of session, pain has decreased to no more than 2/10 and she reports feeling looser in her hips.   OBJECTIVE IMPAIRMENTS: decreased  activity tolerance, difficulty walking, decreased balance, decreased endurance, decreased mobility, decreased ROM, decreased strength, impaired flexibility, impaired UE/LE use, postural dysfunction, and pain.  ACTIVITY LIMITATIONS: bending, lifting, carry, locomotion, cleaning, community activity, driving, and or occupation  PERSONAL FACTORS: Bulging disc (lumbar), Migraines. are also affecting patient's functional outcome.  REHAB POTENTIAL: Good  CLINICAL DECISION MAKING: Stable/uncomplicated  EVALUATION COMPLEXITY: Low    GOALS: Short term PT Goals Target date: 06/03/2022 Pt will be I and compliant with HEP. Baseline:  Goal status: MET on 06/24/2022  Pt will decrease pain by 25% overall Baseline: Goal status: MET on 06/24/2022  Long term PT goals Target date: 08/12/2022 Pt will walk with improved gait pattern with no reported pain in her R lower back.        Baseline:        Goal status: New       2. Pt will report self management of lower back  symptoms <2/10                   Baseline:       Goal status: New   PLAN: PT FREQUENCY: Pt plans to come in as needed for DN  PT DURATION: 12 weeks  PLANNED INTERVENTIONS (unless contraindicated): aquatic PT, Canalith repositioning, cryotherapy, Electrical stimulation, Iontophoresis with 4 mg/ml dexamethasome, Moist heat, traction, Ultrasound, gait training, Therapeutic exercise, balance training, neuromuscular re-education, patient/family education, prosthetic training, manual techniques, passive ROM, dry needling, taping, vasopnuematic device, vestibular, spinal manipulations, joint manipulations  PLAN FOR NEXT SESSION: assess and progress HEP as indicated, strengthening/core stability, flexibility, lumbar extension, manual/dry needling as indicated.   Juel Burrow, PT 06/24/2022, 11:46 AM   [facility] [address], [place], [place] [address] Phone # [phone] Fax [phone]

## 2022-06-28 ENCOUNTER — Ambulatory Visit: Payer: No Typology Code available for payment source | Admitting: Rehabilitative and Restorative Service Providers"

## 2022-06-28 ENCOUNTER — Encounter: Payer: Self-pay | Admitting: Rehabilitative and Restorative Service Providers"

## 2022-06-28 DIAGNOSIS — M6281 Muscle weakness (generalized): Secondary | ICD-10-CM

## 2022-06-28 DIAGNOSIS — R262 Difficulty in walking, not elsewhere classified: Secondary | ICD-10-CM

## 2022-06-28 DIAGNOSIS — M5459 Other low back pain: Secondary | ICD-10-CM

## 2022-06-28 NOTE — Therapy (Addendum)
OUTPATIENT PHYSICAL THERAPY TREATMENT NOTE   Patient Name: Kelsey [name] MRN: [id number] DOB:[birth year], 57 y.o., female Today's Date: 06/28/2022  END OF SESSION:  PT End of Session - 06/28/22 0734     Visit Number 3    Date for PT Re-Evaluation 08/19/22    Authorization Type [place] PREFERRED    PT Start Time 0727    PT Stop Time 0800    PT Time Calculation (min) 33 min    Activity Tolerance Patient tolerated treatment well    Behavior During Therapy [facility] for tasks assessed/performed             Past Medical History:  Diagnosis Date   Abnormal Pap smear    Bulging lumbar disc    L4, l5--scheduled for surgery 05-04-18--Dr. Elsner   Hidradenitis    Hyperlipidemia    Infertility, female    no method of birth from late 20's through the 40's,  Headaches on OCP   Migraine    with aura   Sleep apnea 2021   Past Surgical History:  Procedure Laterality Date   ABDOMINAL EXPOSURE N/A 05/04/2018   Procedure: ABDOMINAL EXPOSURE;  Surgeon: Marty Heck, MD;  Location: [place];  Service: Vascular;  Laterality: N/A;   ANKLE SURGERY  1998/1999   left ankle fracture with ORIF   ANTERIOR LUMBAR FUSION N/A 05/04/2018   Procedure: Lumbar Four-Five Anterior  lumbar interbody fusion;  Surgeon: Kristeen Miss, MD;  Location: [place];  Service: Neurosurgery;  Laterality: N/A;  anterior   CARPAL TUNNEL RELEASE  05/21/12   right wrist   COLPOSCOPY     CIN I   HIP SURGERY Left 03-06-13   --[place] from injury   [place]   off back - benign   Patient Active Problem List   Diagnosis Date Noted   Spondylolisthesis of lumbar region 05/04/2018   Degenerative disc disease, lumbar 04/17/2018   History of migraine headaches 01/28/2013     REFERRING PROVIDER: Chyrl Civatte Thane Edu, PA-C   REFERRING DIAG: Spinal stenosis, lumbar region with neurogenic claudication [M48.062]   Rationale for Evaluation and Treatment:  Rehabilitation  THERAPY DIAG:  Muscle weakness (generalized)  Difficulty in walking, not elsewhere classified  Other low back pain  ONSET DATE: December 2023  SUBJECTIVE:                                                                                                                                                                                           SUBJECTIVE STATEMENT: Pt reports that after her last dry needling session, her  right piriformis is still feeling significantly looser.  States that she is in increased pain this morning because she did not take her pain medication last night, per usual.  PERTINENT HISTORY:  Bulging disc (lumbar), Migraines.   PAIN:  Are you having pain? Yes: NPRS scale: 6/10 Pain location: R lower lumbar  Pain description: Isolated trigger point, nagging pain Aggravating factors: Sitting, vacuuming  Relieving factors: Rest   PRECAUTIONS: None  WEIGHT BEARING RESTRICTIONS: No  FALLS:  Has patient fallen in last 6 months? No  LIVING ENVIRONMENT: Lives with: lives with their spouse Lives in: House/apartment Stairs: Yes: Internal: 12 steps; on right going up Has following equipment at home: None  OCCUPATION: Hotel manager for [employer]  PLOF: Independent  PATIENT GOALS: Pt would like to reduce lower back pain.   NEXT MD VISIT:   OBJECTIVE:   DIAGNOSTIC FINDINGS:  None   PATIENT SURVEYS:  Eval:  FOTO 50.79%, 62% goal   POSTURE: No Significant postural limitations  PALPATION: Tenderness isolated to R multifidi and paraspinals (L3-L5).   LUMBAR ROM:   AROM eval  Flexion 75%  Extension 100%  Right lateral flexion 100%  Left lateral flexion 100%  Right rotation 100%  Left rotation 100%   (Blank rows = not tested)  LOWER EXTREMITY ROM:     Active  Right eval Left eval  Hip flexion [facility]  Hip internal rotation [facility]  Hip external rotation [facility]  Knee flexion [facility] WFL  Knee extension WFL WFL   (Blank rows = not  tested)  LOWER EXTREMITY MMT:    Pt has functional strength, but has weakness on R LE> L LE.    GAIT: 05/20/2022 Distance walked: 33f  Assistive device utilized: no device  Level of assistance: Independent  Comments: mild antalgia, normalized  TODAY'S TREATMENT:                                                                                                                               DATE: 06/28/2022 Nustep level 1 x6 min with PT present to discuss status Standing hamstring stretch at stairs 2x20 sec bilat Standing hip flexion with overhead reach 2x10 bilat Supine posterior pelvic tilt 2x10 Lower trunk rotation with LE on red pball 2x10 Double knees to chest with LE on red pball 2x10 Prone on elbows x1 min Prone press up 2x10 Trigger Point Dry-Needling  Treatment instructions: Expect mild to moderate muscle soreness. S/S of pneumothorax if dry needled over a lung field, and to seek immediate medical attention should they occur. Patient verbalized understanding of these instructions and education. Patient Consent Given: Yes Education handout provided: Yes Muscles treated: right lumbar multifidi, right glutes, right piriformis Electrical stimulation performed: No Parameters: N/A Treatment response/outcome: Utilized skilled palpation to locate/identify trigger points.  Able to illicit twitch response and muscle elongation following.   DATE: 06/24/2022 Nustep level 1 x6 min with PT present to discuss status Standing hamstring stretch  at stairs 2x20 sec bilat Supine posterior pelvic tilt 2x10 Supine hooklying with isometric press into ball for TA contraction.  2x10 Lower trunk rotation with LE on red pball 2x10 Double knees to chest with LE on red pball 2x10 Prone on elbows x2 min Prone press up 2x10 Prone hip extension 2x10 bilat Trigger Point Dry-Needling  Treatment instructions: Expect mild to moderate muscle soreness. S/S of pneumothorax if dry needled over a lung field,  and to seek immediate medical attention should they occur. Patient verbalized understanding of these instructions and education. Patient Consent Given: Yes Education handout provided: Yes Muscles treated: right lumbar multifidi, right glutes, right piriformis Electrical stimulation performed: No Parameters: N/A Treatment response/outcome: Utilized skilled palpation to locate/identify trigger points.  Able to illicit twitch response and muscle elongation following. Manual Therapy:  Soft tissue mobilization to bilateral lumbar paraspinals and right glutes/piriformis.    DATE:  Eval  Creating, reviewing, and completing below HEP.  Trigger Point Dry-Needling  Treatment instructions: Expect mild to moderate muscle soreness. S/S of pneumothorax if dry needled over a lung field, and to seek immediate medical attention should they occur. Patient verbalized understanding of these instructions and education.  Patient Consent Given: Yes Education handout provided: No Muscles treated: R multifdi, and paraspinals.  Electrical stimulation performed: No Parameters: N/A Treatment response/outcome: Decreased pain.     PATIENT EDUCATION:  Education details: Educated pt on anatomy and physiology of current symptoms, FOTO, diagnosis, prognosis, HEP,  and POC. Person educated: Patient Education method: Customer service manager Education comprehension: verbalized understanding and returned demonstration  HOME EXERCISE PROGRAM: Access Code: [email or web address] URL: [email or web address] Date: 06/24/2022 Prepared by: Shelby Dubin Arlander Gillen  Exercises - Seated Abdominal Press into [employer]  - 1 x daily - 7 x weekly - 2 sets - 10 reps - Supine Posterior Pelvic Tilt  - 1 x daily - 7 x weekly - 2 sets - 10 reps - Supine Lower Trunk Rotation with Swiss Ball  - 1 x daily - 7 x weekly - 2 sets - 10 reps - Supine Hip and Knee Flexion AROM with Swiss Ball  - 1 x daily - 7 x weekly - 2 sets - 10 reps -  Static Prone on Elbows  - 1 x daily - 7 x weekly - 1 sets - 2 reps - 1-2 min hold - Prone Press Up  - 1 x daily - 7 x weekly - 2 sets - 10 reps - Prone Hip Extension  - 1 x daily - 7 x weekly - 2 sets - 10 reps - Child's Pose Stretch  - 1 x daily - 7 x weekly - 1 sets - 2 reps - 20 sec hold   ASSESSMENT:  CLINICAL IMPRESSION:  Ms Delbosque presents to skilled PT with reporting that she overall can feel a lot less tightness in her piriformis muscle.  Patient tolerates stretches on stairs well and reports feeling looser following.  Patient with continued trigger points noted along right lumbar paraspinals and decreased trigger point noted for right piriformis.  Patient continues to progress towards increased strengthening and decreased overall pain, even with not taking her pain medication last night.   OBJECTIVE IMPAIRMENTS: decreased activity tolerance, difficulty walking, decreased balance, decreased endurance, decreased mobility, decreased ROM, decreased strength, impaired flexibility, impaired UE/LE use, postural dysfunction, and pain.  ACTIVITY LIMITATIONS: bending, lifting, carry, locomotion, cleaning, community activity, driving, and or occupation  PERSONAL FACTORS: Bulging disc (lumbar), Migraines. are also affecting patient's functional outcome.  REHAB POTENTIAL:  Good  CLINICAL DECISION MAKING: Stable/uncomplicated  EVALUATION COMPLEXITY: Low    GOALS: Short term PT Goals Target date: 06/03/2022 Pt will be I and compliant with HEP. Baseline:  Goal status: MET on 06/24/2022  Pt will decrease pain by 25% overall Baseline: Goal status: MET on 06/24/2022  Long term PT goals Target date: 08/12/2022 Pt will walk with improved gait pattern with no reported pain in her R lower back.        Baseline:        Goal status: New       2. Pt will report self management of lower back symptoms <2/10                   Baseline:       Goal status: New   PLAN: PT FREQUENCY: Pt plans to come in  as needed for DN  PT DURATION: 12 weeks  PLANNED INTERVENTIONS (unless contraindicated): aquatic PT, Canalith repositioning, cryotherapy, Electrical stimulation, Iontophoresis with 4 mg/ml dexamethasome, Moist heat, traction, Ultrasound, gait training, Therapeutic exercise, balance training, neuromuscular re-education, patient/family education, prosthetic training, manual techniques, passive ROM, dry needling, taping, vasopnuematic device, vestibular, spinal manipulations, joint manipulations  PLAN FOR NEXT SESSION: assess and progress HEP as indicated, strengthening/core stability, flexibility, lumbar extension, manual/dry needling as indicated.   Juel Burrow, PT 06/28/2022, 8:41 AM   [facility] [address] [place], [place] [address] Phone # [phone] Fax [phone]

## 2022-06-29 ENCOUNTER — Ambulatory Visit
Admission: RE | Admit: 2022-06-29 | Discharge: 2022-06-29 | Disposition: A | Discharge status: Home / Self Care | Payer: No Typology Code available for payment source | Source: Ambulatory Visit | Attending: Obstetrics and Gynecology | Admitting: Obstetrics and Gynecology

## 2022-06-29 ENCOUNTER — Encounter: Payer: Self-pay | Admitting: Radiology

## 2022-06-29 DIAGNOSIS — Z1231 Encounter for screening mammogram for malignant neoplasm of breast: Secondary | ICD-10-CM

## 2022-07-04 ENCOUNTER — Ambulatory Visit: Payer: No Typology Code available for payment source | Admitting: Rehabilitative and Restorative Service Providers"

## 2022-07-05 ENCOUNTER — Telehealth: Payer: No Typology Code available for payment source | Admitting: Family Medicine

## 2022-07-07 NOTE — Progress Notes (Signed)
PATIENT: Kelsey [name] DOB: [birth year]  REASON FOR VISIT: follow up HISTORY FROM: patient  Virtual Visit via Telephone Note  I connected with Kelsey [name] on 07/11/22 at  8:30 AM EST by telephone and verified that I am speaking with the correct person using two identifiers.   I discussed the limitations, risks, security and privacy concerns of performing an evaluation and management service by telephone and the availability of in person appointments. I also discussed with the patient that there may be a patient responsible charge related to this service. The patient expressed understanding and agreed to proceed.   History of Present Illness:  07/11/22 ALL: Kelsey [name] is a 57 y.o. female here today for follow up for OSA on CPAP. She continues to do well. She does note benefit of using CPAP. She has traveled more over the past few months and feels more tired when not using her CPAP. She denies concerns with machine or supplies.      History (copied from Dr Guadelupe Sabin previous note)  Kelsey [name] is a 57 year old right-handed woman with an underlying medical history of degenerative lumbar spine disease with status post surgery in 2019, hyperlipidemia, migraine headaches, and obesity, who presents for her yearly follow-up of her sleep apnea, on AutoPap therapy.  The patient is unaccompanied today. I last saw her on 06/24/2020, at which time she was fully compliant with AutoPap and reported feeling better.  She no longer had sensations of trembling or vibration inside.  I suggested we pursue an ONO to make sure her oxygen saturations are adequate while on AutoPap therapy.  She had a pulse ox overnight on 09/03/2020 which indicated good oxygen saturations overnight while on AutoPap therapy.   Today, 06/24/21: I reviewed her compliance data on her phone app.  She has an average usage of 8+ hours typically and has been consistent with her usage.  Average AHI right around 1/h, average pressure right  around 8 cm.  Unfortunately, we were not able to get an actual download from her machine despite her bring the machine with the SD card and the power cord, we tried a different SD card from our office as well which did not transfer the data. She reports doing well with regard to her OSA, compliant with the autoPAP. She has had LBP and went through PT, then started having issues with her L hip, which was operated on some years ago. She then started having R hip pain. She is seeing ortho, Dr. Maureen Ralphs and his PA and is scheduled to get a steroid injection in about 2 weeks. Her weight had gone down, then back up, due to inability to exercise. She has been started on generic Lipitor recently per PCP.  She has used a cane as needed.  She has had intermittent cramping sensation in her lower body, similar to the vibration/trembling she has experienced before, but nothing sustained or alarming or progressive.     Observations/Objective:  Generalized: Well developed, in no acute distress  Mentation: Alert oriented to time, place, history taking. Follows all commands speech and language fluent   Assessment and Plan:  57 y.o. year old female  has a past medical history of Abnormal Pap smear, Bulging lumbar disc, Hidradenitis, Hyperlipidemia, Infertility, female, Migraine, and Sleep apnea (2021). here with    ICD-10-CM   1. OSA on CPAP  G47.33 For home use only DME continuous positive airway pressure (CPAP)      Kelsey [name] is doing well on CPAP.  Compliance report shows acceptable usage. I have encourage her to use CPAP nightly for at least 4 hours. Consider travel machine if needed. Healthy lifestyle habits encouraged. She will follow up with me in 1 year, sooner if needed.   Orders Placed This Encounter  Procedures   For home use only DME continuous positive airway pressure (CPAP)    Supplies    Order Specific Question:   Length of Need    Answer:   Lifetime    Order Specific Question:   Patient has OSA or  probable OSA    Answer:   Yes    Order Specific Question:   Is the patient currently using CPAP in the home    Answer:   Yes    Order Specific Question:   Settings    Answer:   Other see comments    Order Specific Question:   CPAP supplies needed    Answer:   Mask, headgear, cushions, filters, heated tubing and water chamber    No orders of the defined types were placed in this encounter.    Follow Up Instructions:  I discussed the assessment and treatment plan with the patient. The patient was provided an opportunity to ask questions and all were answered. The patient agreed with the plan and demonstrated an understanding of the instructions.   The patient was advised to call back or seek an in-person evaluation if the symptoms worsen or if the condition fails to improve as anticipated.  I provided 15 minutes of non-face-to-face time during this encounter. Patient located at their place of residence during Kelsey [name] visit. Provider is in the office.    Debbora Presto, NP

## 2022-07-07 NOTE — Patient Instructions (Addendum)
Please continue using your CPAP regularly. While your insurance requires that you use CPAP at least 4 hours each night on 70% of the nights, I recommend, that you not skip any nights and use it throughout the night if you can. Getting used to CPAP and staying with the treatment long term does take time and patience and discipline. Untreated obstructive sleep apnea when it is moderate to severe can have an adverse impact on cardiovascular health and raise her risk for heart disease, arrhythmias, hypertension, congestive heart failure, stroke and diabetes. Untreated obstructive sleep apnea causes sleep disruption, nonrestorative sleep, and sleep deprivation. This can have an impact on your day to day functioning and cause daytime sleepiness and impairment of cognitive function, memory loss, mood disturbance, and problems focussing. Using CPAP regularly can improve these symptoms.  We were able to get your compliance data downloaded. Everything looks great. Consider a travel machine if you plan to continue frequent travel. You can take your LUNA machine if you prefer.   Follow up in 1 year

## 2022-07-11 ENCOUNTER — Encounter: Payer: Self-pay | Admitting: Family Medicine

## 2022-07-11 ENCOUNTER — Telehealth (INDEPENDENT_AMBULATORY_CARE_PROVIDER_SITE_OTHER): Payer: No Typology Code available for payment source | Admitting: Family Medicine

## 2022-07-11 DIAGNOSIS — G4733 Obstructive sleep apnea (adult) (pediatric): Secondary | ICD-10-CM

## 2022-07-12 ENCOUNTER — Ambulatory Visit: Payer: No Typology Code available for payment source | Admitting: Rehabilitative and Restorative Service Providers"

## 2022-07-12 ENCOUNTER — Encounter: Payer: Self-pay | Admitting: Rehabilitative and Restorative Service Providers"

## 2022-07-12 DIAGNOSIS — M5459 Other low back pain: Secondary | ICD-10-CM

## 2022-07-12 DIAGNOSIS — R262 Difficulty in walking, not elsewhere classified: Secondary | ICD-10-CM

## 2022-07-12 DIAGNOSIS — M6281 Muscle weakness (generalized): Secondary | ICD-10-CM | POA: Diagnosis not present

## 2022-07-12 NOTE — Therapy (Signed)
OUTPATIENT PHYSICAL THERAPY TREATMENT NOTE   Patient Name: Kelsey [name] MRN: [id number] DOB:1965-12-13, 57 y.o., female Today's Date: 07/12/2022  END OF SESSION:  PT End of Session - 07/12/22 0737     Visit Number 4    Date for PT Re-Evaluation 08/19/22    Authorization Type [place] PREFERRED    PT Start Time 0730    PT Stop Time 0800    PT Time Calculation (min) 30 min    Activity Tolerance Patient tolerated treatment well    Behavior During Therapy [facility] for tasks assessed/performed             Past Medical History:  Diagnosis Date   Abnormal Pap smear    Bulging lumbar disc    L4, l5--scheduled for surgery 05-04-18--Dr. Elsner   Hidradenitis    Hyperlipidemia    Infertility, female    no method of birth from late 20's through the 40's,  Headaches on OCP   Migraine    with aura   Sleep apnea 2021   Past Surgical History:  Procedure Laterality Date   ABDOMINAL EXPOSURE N/A 05/04/2018   Procedure: ABDOMINAL EXPOSURE;  Surgeon: Marty Heck, MD;  Location: [place];  Service: Vascular;  Laterality: N/A;   ANKLE SURGERY  1998/1999   left ankle fracture with ORIF   ANTERIOR LUMBAR FUSION N/A 05/04/2018   Procedure: Lumbar Four-Five Anterior  lumbar interbody fusion;  Surgeon: Kristeen Miss, MD;  Location: [place];  Service: Neurosurgery;  Laterality: N/A;  anterior   CARPAL TUNNEL RELEASE  05/21/12   right wrist   COLPOSCOPY     CIN I   HIP SURGERY Left 03-06-13   --[place] from injury   [place]   off back - benign   Patient Active Problem List   Diagnosis Date Noted   Spondylolisthesis of lumbar region 05/04/2018   Degenerative disc disease, lumbar 04/17/2018   History of migraine headaches 01/28/2013     REFERRING PROVIDER: Chyrl Civatte Thane Edu, PA-C   REFERRING DIAG: Spinal stenosis, lumbar region with neurogenic claudication [M48.062]   Rationale for Evaluation and Treatment:  Rehabilitation  THERAPY DIAG:  Muscle weakness (generalized)  Difficulty in walking, not elsewhere classified  Other low back pain  ONSET DATE: December 2023  SUBJECTIVE:                                                                                                                                                                                           SUBJECTIVE STATEMENT: Pt reports that she has one spot on her back  that seems to be spasming.  PERTINENT HISTORY:  Bulging disc (lumbar), Migraines.   PAIN:  Are you having pain? Yes: NPRS scale: 4/10 Pain location: R lower lumbar  Pain description: Isolated trigger point, nagging pain Aggravating factors: Sitting, vacuuming  Relieving factors: Rest   PRECAUTIONS: None  WEIGHT BEARING RESTRICTIONS: No  FALLS:  Has patient fallen in last 6 months? No  LIVING ENVIRONMENT: Lives with: lives with their spouse Lives in: House/apartment Stairs: Yes: Internal: 12 steps; on right going up Has following equipment at home: None  OCCUPATION: Hotel manager for [employer]  PLOF: Independent  PATIENT GOALS: Pt would like to reduce lower back pain.   NEXT MD VISIT:   OBJECTIVE:   DIAGNOSTIC FINDINGS:  None   PATIENT SURVEYS:  Eval:  FOTO 50.79%, 62% goal   POSTURE: No Significant postural limitations  PALPATION: Tenderness isolated to R multifidi and paraspinals (L3-L5).   LUMBAR ROM:   AROM eval  Flexion 75%  Extension 100%  Right lateral flexion 100%  Left lateral flexion 100%  Right rotation 100%  Left rotation 100%   (Blank rows = not tested)  LOWER EXTREMITY ROM:     Active  Right eval Left eval  Hip flexion [facility]  Hip internal rotation [facility]  Hip external rotation [facility]  Knee flexion [facility] WFL  Knee extension WFL WFL   (Blank rows = not tested)  LOWER EXTREMITY MMT:    Pt has functional strength, but has weakness on R LE> L LE.    GAIT: 05/20/2022 Distance walked: 85f  Assistive device  utilized: no device  Level of assistance: Independent  Comments: mild antalgia, normalized  TODAY'S TREATMENT:                                                                                                                               DATE: 07/12/2022 Standing hamstring stretch at stairs 2x20 sec bilat Standing hip flexion with overhead reach 2x10 bilat Nustep level 5 x5 min with PT present to discuss status Modified dead lift with 10# kettle bell 2x10 Quadruped cat/cow 2x10 3 way child's pose 2x20 sec each direction Prone alt UE/LE extension 2x10 bilat Prone press up 2x10 Trigger Point Dry-Needling  Treatment instructions: Expect mild to moderate muscle soreness. S/S of pneumothorax if dry needled over a lung field, and to seek immediate medical attention should they occur. Patient verbalized understanding of these instructions and education. Patient Consent Given: Yes Education handout provided: Yes Muscles treated: right lumbar multifidi Electrical stimulation performed: No Parameters: N/A Treatment response/outcome: Utilized skilled palpation to locate/identify trigger points.  Able to illicit twitch response and muscle elongation following. Manual Therapy:  Soft tissue mobilization to bilateral lumbar paraspinals and right glutes/piriformis.   DATE: 06/28/2022 Nustep level 1 x6 min with PT present to discuss status Standing hamstring stretch at stairs 2x20 sec bilat Standing hip flexion with overhead reach 2x10 bilat Supine posterior pelvic tilt 2x10 Lower  trunk rotation with LE on red pball 2x10 Double knees to chest with LE on red pball 2x10 Prone on elbows x1 min Prone press up 2x10 Trigger Point Dry-Needling  Treatment instructions: Expect mild to moderate muscle soreness. S/S of pneumothorax if dry needled over a lung field, and to seek immediate medical attention should they occur. Patient verbalized understanding of these instructions and education. Patient Consent  Given: Yes Education handout provided: Yes Muscles treated: right lumbar multifidi, right glutes, right piriformis Electrical stimulation performed: No Parameters: N/A Treatment response/outcome: Utilized skilled palpation to locate/identify trigger points.  Able to illicit twitch response and muscle elongation following.   DATE: 06/24/2022 Nustep level 1 x6 min with PT present to discuss status Standing hamstring stretch at stairs 2x20 sec bilat Supine posterior pelvic tilt 2x10 Supine hooklying with isometric press into ball for TA contraction.  2x10 Lower trunk rotation with LE on red pball 2x10 Double knees to chest with LE on red pball 2x10 Prone on elbows x2 min Prone press up 2x10 Prone hip extension 2x10 bilat Trigger Point Dry-Needling  Treatment instructions: Expect mild to moderate muscle soreness. S/S of pneumothorax if dry needled over a lung field, and to seek immediate medical attention should they occur. Patient verbalized understanding of these instructions and education. Patient Consent Given: Yes Education handout provided: Yes Muscles treated: right lumbar multifidi, right glutes, right piriformis Electrical stimulation performed: No Parameters: N/A Treatment response/outcome: Utilized skilled palpation to locate/identify trigger points.  Able to illicit twitch response and muscle elongation following. Manual Therapy:  Soft tissue mobilization to bilateral lumbar paraspinals and right glutes/piriformis.       PATIENT EDUCATION:  Education details: Educated pt on anatomy and physiology of current symptoms, FOTO, diagnosis, prognosis, HEP,  and POC. Person educated: Patient Education method: Customer service manager Education comprehension: verbalized understanding and returned demonstration  HOME EXERCISE PROGRAM: Access Code: [email or web address] URL: [email or web address] Date: 06/24/2022 Prepared by: Shelby Dubin Dakotah Orrego  Exercises - Seated Abdominal  Press into [employer]  - 1 x daily - 7 x weekly - 2 sets - 10 reps - Supine Posterior Pelvic Tilt  - 1 x daily - 7 x weekly - 2 sets - 10 reps - Supine Lower Trunk Rotation with Swiss Ball  - 1 x daily - 7 x weekly - 2 sets - 10 reps - Supine Hip and Knee Flexion AROM with Swiss Ball  - 1 x daily - 7 x weekly - 2 sets - 10 reps - Static Prone on Elbows  - 1 x daily - 7 x weekly - 1 sets - 2 reps - 1-2 min hold - Prone Press Up  - 1 x daily - 7 x weekly - 2 sets - 10 reps - Prone Hip Extension  - 1 x daily - 7 x weekly - 2 sets - 10 reps - Child's Pose Stretch  - 1 x daily - 7 x weekly - 1 sets - 2 reps - 20 sec hold   ASSESSMENT:  CLINICAL IMPRESSION:  Ms Ambert presents to skilled PT with reporting overall less pain than she has been feeling.  Patient reports that she has been doing her HEP and noted that she has not required use of TENS unit as much.  Patient continues to take pain medication at night to assist with sleeping.  Patient continues to progress with strengthening and stretching during PT session with decreased cuing for exercises and technique.  Patient reports a significant release of her pain following dry  needling/manual therapy.   OBJECTIVE IMPAIRMENTS: decreased activity tolerance, difficulty walking, decreased balance, decreased endurance, decreased mobility, decreased ROM, decreased strength, impaired flexibility, impaired UE/LE use, postural dysfunction, and pain.  ACTIVITY LIMITATIONS: bending, lifting, carry, locomotion, cleaning, community activity, driving, and or occupation  PERSONAL FACTORS: Bulging disc (lumbar), Migraines. are also affecting patient's functional outcome.  REHAB POTENTIAL: Good  CLINICAL DECISION MAKING: Stable/uncomplicated  EVALUATION COMPLEXITY: Low    GOALS: Short term PT Goals Target date: 06/03/2022 Pt will be I and compliant with HEP. Baseline:  Goal status: MET on 06/24/2022  Pt will decrease pain by 25% overall Baseline: Goal  status: MET on 06/24/2022  Long term PT goals Target date: 08/12/2022 Pt will walk with improved gait pattern with no reported pain in her R lower back.        Baseline:        Goal status: New       2. Pt will report self management of lower back symptoms <2/10                   Baseline:       Goal status: New   PLAN: PT FREQUENCY: Pt plans to come in as needed for DN  PT DURATION: 12 weeks  PLANNED INTERVENTIONS (unless contraindicated): aquatic PT, Canalith repositioning, cryotherapy, Electrical stimulation, Iontophoresis with 4 mg/ml dexamethasome, Moist heat, traction, Ultrasound, gait training, Therapeutic exercise, balance training, neuromuscular re-education, patient/family education, prosthetic training, manual techniques, passive ROM, dry needling, taping, vasopnuematic device, vestibular, spinal manipulations, joint manipulations  PLAN FOR NEXT SESSION: assess and progress HEP as indicated, strengthening/core stability, flexibility, lumbar extension, manual/dry needling as indicated.   Juel Burrow, PT 07/12/2022, 8:15 AM   [facility] [address] [place], [place] [address] Phone # [phone] Fax [phone]

## 2022-07-18 ENCOUNTER — Encounter: Payer: Self-pay | Admitting: Rehabilitative and Restorative Service Providers"

## 2022-07-18 ENCOUNTER — Ambulatory Visit
Payer: No Typology Code available for payment source | Attending: Neurosurgery | Admitting: Rehabilitative and Restorative Service Providers"

## 2022-07-18 DIAGNOSIS — R262 Difficulty in walking, not elsewhere classified: Secondary | ICD-10-CM | POA: Diagnosis present

## 2022-07-18 DIAGNOSIS — M6281 Muscle weakness (generalized): Secondary | ICD-10-CM | POA: Diagnosis present

## 2022-07-18 DIAGNOSIS — M5459 Other low back pain: Secondary | ICD-10-CM | POA: Diagnosis present

## 2022-07-18 NOTE — Therapy (Signed)
OUTPATIENT PHYSICAL THERAPY TREATMENT NOTE   Patient Name: Kelsey [name] MRN: [id number] DOB:05-21-1965, 57 y.o., female Today's Date: 07/18/2022  END OF SESSION:  PT End of Session - 07/18/22 0740     Visit Number 5    Date for PT Re-Evaluation 08/19/22    Authorization Type [place] PREFERRED    PT Start Time 0730    PT Stop Time 0800    PT Time Calculation (min) 30 min    Activity Tolerance Patient tolerated treatment well    Behavior During Therapy [facility] for tasks assessed/performed             Past Medical History:  Diagnosis Date   Abnormal Pap smear    Bulging lumbar disc    L4, l5--scheduled for surgery 05-04-18--Dr. Elsner   Hidradenitis    Hyperlipidemia    Infertility, female    no method of birth from late 20's through the 40's,  Headaches on OCP   Migraine    with aura   Sleep apnea 2021   Past Surgical History:  Procedure Laterality Date   ABDOMINAL EXPOSURE N/A 05/04/2018   Procedure: ABDOMINAL EXPOSURE;  Surgeon: Marty Heck, MD;  Location: [place];  Service: Vascular;  Laterality: N/A;   ANKLE SURGERY  1998/1999   left ankle fracture with ORIF   ANTERIOR LUMBAR FUSION N/A 05/04/2018   Procedure: Lumbar Four-Five Anterior  lumbar interbody fusion;  Surgeon: Kristeen Miss, MD;  Location: [place];  Service: Neurosurgery;  Laterality: N/A;  anterior   CARPAL TUNNEL RELEASE  05/21/12   right wrist   COLPOSCOPY     CIN I   HIP SURGERY Left 03-06-13   --[place] from injury   [place]   off back - benign   Patient Active Problem List   Diagnosis Date Noted   Spondylolisthesis of lumbar region 05/04/2018   Degenerative disc disease, lumbar 04/17/2018   History of migraine headaches 01/28/2013     REFERRING PROVIDER: Chyrl Civatte Thane Edu, PA-C   REFERRING DIAG: Spinal stenosis, lumbar region with neurogenic claudication [M48.062]   Rationale for Evaluation and Treatment:  Rehabilitation  THERAPY DIAG:  Muscle weakness (generalized)  Difficulty in walking, not elsewhere classified  Other low back pain  ONSET DATE: December 2023  SUBJECTIVE:                                                                                                                                                                                           SUBJECTIVE STATEMENT: Pt reports that she was able to go without using  pain medication over the weekend, only using a heating pad.  States that she has not been that long without taking pain medication over a weekend in nearly a year.  PERTINENT HISTORY:  Bulging disc (lumbar), Migraines.   PAIN:  Are you having pain? Yes: NPRS scale: 2-4/10 Pain location: R lower lumbar  Pain description: Isolated trigger point, nagging pain Aggravating factors: Sitting, vacuuming  Relieving factors: Rest   PRECAUTIONS: None  WEIGHT BEARING RESTRICTIONS: No  FALLS:  Has patient fallen in last 6 months? No  LIVING ENVIRONMENT: Lives with: lives with their spouse Lives in: House/apartment Stairs: Yes: Internal: 12 steps; on right going up Has following equipment at home: None  OCCUPATION: Hotel manager for [employer]  PLOF: Independent  PATIENT GOALS: Pt would like to reduce lower back pain.   NEXT MD VISIT:   OBJECTIVE:   DIAGNOSTIC FINDINGS:  None   PATIENT SURVEYS:  Eval:  FOTO 50.79%, 62% goal   POSTURE: No Significant postural limitations  PALPATION: Tenderness isolated to R multifidi and paraspinals (L3-L5).   LUMBAR ROM:   AROM eval  Flexion 75%  Extension 100%  Right lateral flexion 100%  Left lateral flexion 100%  Right rotation 100%  Left rotation 100%   (Blank rows = not tested)  LOWER EXTREMITY ROM:     Active  Right eval Left eval  Hip flexion [facility]  Hip internal rotation [facility]  Hip external rotation [facility]  Knee flexion [facility] WFL  Knee extension WFL WFL   (Blank rows = not tested)  LOWER  EXTREMITY MMT:    Pt has functional strength, but has weakness on R LE> L LE.    GAIT: 05/20/2022 Distance walked: 73f  Assistive device utilized: no device  Level of assistance: Independent  Comments: mild antalgia, normalized  TODAY'S TREATMENT:                                                                                                                               DATE: 07/18/2022 Standing hamstring stretch at stairs 2x20 sec bilat Standing hip flexion with overhead reach 2x10 bilat Nustep level 5 x5 min with PT present to discuss status FWD and side lunging onto bosu 2x10 bilat Quadruped cat/cow 2x10 3 way child's pose 2x20 sec each direction Prone alt UE/LE extension 2x10 bilat Trigger Point Dry-Needling  Treatment instructions: Expect mild to moderate muscle soreness. S/S of pneumothorax if dry needled over a lung field, and to seek immediate medical attention should they occur. Patient verbalized understanding of these instructions and education. Patient Consent Given: Yes Education handout provided: Yes Muscles treated: right lumbar multifidi Electrical stimulation performed: No Parameters: N/A Treatment response/outcome: Utilized skilled palpation to locate/identify trigger points.  Able to illicit twitch response and muscle elongation following. Soft tissue mobilization to piriformis and lumbar paraspinals to promote further elongation.   DATE: 07/12/2022 Standing hamstring stretch at stairs 2x20 sec bilat Standing hip flexion with overhead  reach 2x10 bilat Nustep level 5 x5 min with PT present to discuss status Modified dead lift with 10# kettle bell 2x10 Quadruped cat/cow 2x10 3 way child's pose 2x20 sec each direction Prone alt UE/LE extension 2x10 bilat Prone press up 2x10 Trigger Point Dry-Needling  Treatment instructions: Expect mild to moderate muscle soreness. S/S of pneumothorax if dry needled over a lung field, and to seek immediate medical attention  should they occur. Patient verbalized understanding of these instructions and education. Patient Consent Given: Yes Education handout provided: Yes Muscles treated: right lumbar multifidi Electrical stimulation performed: No Parameters: N/A Treatment response/outcome: Utilized skilled palpation to locate/identify trigger points.  Able to illicit twitch response and muscle elongation following. Manual Therapy:  Soft tissue mobilization to bilateral lumbar paraspinals and right glutes/piriformis.   DATE: 06/28/2022 Nustep level 1 x6 min with PT present to discuss status Standing hamstring stretch at stairs 2x20 sec bilat Standing hip flexion with overhead reach 2x10 bilat Supine posterior pelvic tilt 2x10 Lower trunk rotation with LE on red pball 2x10 Double knees to chest with LE on red pball 2x10 Prone on elbows x1 min Prone press up 2x10 Trigger Point Dry-Needling  Treatment instructions: Expect mild to moderate muscle soreness. S/S of pneumothorax if dry needled over a lung field, and to seek immediate medical attention should they occur. Patient verbalized understanding of these instructions and education. Patient Consent Given: Yes Education handout provided: Yes Muscles treated: right lumbar multifidi, right glutes, right piriformis Electrical stimulation performed: No Parameters: N/A Treatment response/outcome: Utilized skilled palpation to locate/identify trigger points.  Able to illicit twitch response and muscle elongation following.      PATIENT EDUCATION:  Education details: Educated pt on anatomy and physiology of current symptoms, FOTO, diagnosis, prognosis, HEP,  and POC. Person educated: Patient Education method: Customer service manager Education comprehension: verbalized understanding and returned demonstration  HOME EXERCISE PROGRAM: Access Code: [email or web address] URL: [email or web address] Date: 06/24/2022 Prepared by: Kelsey [name]  Jaris Kohles  Exercises - Seated Abdominal Press into [employer]  - 1 x daily - 7 x weekly - 2 sets - 10 reps - Supine Posterior Pelvic Tilt  - 1 x daily - 7 x weekly - 2 sets - 10 reps - Supine Lower Trunk Rotation with Swiss Ball  - 1 x daily - 7 x weekly - 2 sets - 10 reps - Supine Hip and Knee Flexion AROM with Swiss Ball  - 1 x daily - 7 x weekly - 2 sets - 10 reps - Static Prone on Elbows  - 1 x daily - 7 x weekly - 1 sets - 2 reps - 1-2 min hold - Prone Press Up  - 1 x daily - 7 x weekly - 2 sets - 10 reps - Prone Hip Extension  - 1 x daily - 7 x weekly - 2 sets - 10 reps - Child's Pose Stretch  - 1 x daily - 7 x weekly - 1 sets - 2 reps - 20 sec hold   ASSESSMENT:  CLINICAL IMPRESSION:  Kelsey [name] presents to skilled PT with reporting that she can tell she is making progress towards decreased pain.  Patient is pleased that she was able to go longer without taking pain medication at night.  Patient continues with twitch response during dry needling and muscle elongation noted with manual therapy.  Patient continues to progress with increased strengthening and core stability during session.   OBJECTIVE IMPAIRMENTS: decreased activity tolerance, difficulty walking, decreased balance, decreased endurance, decreased mobility,  decreased ROM, decreased strength, impaired flexibility, impaired UE/LE use, postural dysfunction, and pain.  ACTIVITY LIMITATIONS: bending, lifting, carry, locomotion, cleaning, community activity, driving, and or occupation  PERSONAL FACTORS: Bulging disc (lumbar), Migraines. are also affecting patient's functional outcome.  REHAB POTENTIAL: Good  CLINICAL DECISION MAKING: Stable/uncomplicated  EVALUATION COMPLEXITY: Low    GOALS: Short term PT Goals Target date: 06/03/2022 Pt will be I and compliant with HEP. Baseline:  Goal status: MET on 06/24/2022  Pt will decrease pain by 25% overall Baseline: Goal status: MET on 06/24/2022  Long term PT goals Target date:  08/12/2022 Pt will walk with improved gait pattern with no reported pain in her R lower back.        Baseline:        Goal status: ONGOING        2. Pt will report self management of lower back symptoms <2/10                   Baseline:       Goal status: ONGOING  PLAN: PT FREQUENCY: Pt plans to come in as needed for DN  PT DURATION: 12 weeks  PLANNED INTERVENTIONS (unless contraindicated): aquatic PT, Canalith repositioning, cryotherapy, Electrical stimulation, Iontophoresis with 4 mg/ml dexamethasome, Moist heat, traction, Ultrasound, gait training, Therapeutic exercise, balance training, neuromuscular re-education, patient/family education, prosthetic training, manual techniques, passive ROM, dry needling, taping, vasopnuematic device, vestibular, spinal manipulations, joint manipulations  PLAN FOR NEXT SESSION: assess and progress HEP as indicated, strengthening/core stability, flexibility, lumbar extension, manual/dry needling as indicated.   Juel Burrow, PT 07/18/2022, 8:07 AM   [facility] [address] [place], [place] [address] Phone # [phone] Fax [phone]

## 2022-07-25 ENCOUNTER — Encounter: Payer: Self-pay | Admitting: Rehabilitative and Restorative Service Providers"

## 2022-07-25 ENCOUNTER — Ambulatory Visit: Payer: No Typology Code available for payment source | Admitting: Rehabilitative and Restorative Service Providers"

## 2022-07-25 DIAGNOSIS — M6281 Muscle weakness (generalized): Secondary | ICD-10-CM

## 2022-07-25 DIAGNOSIS — M5459 Other low back pain: Secondary | ICD-10-CM

## 2022-07-25 DIAGNOSIS — R262 Difficulty in walking, not elsewhere classified: Secondary | ICD-10-CM

## 2022-07-25 NOTE — Therapy (Signed)
OUTPATIENT PHYSICAL THERAPY TREATMENT NOTE   Patient Name: Kelsey [name] MRN: [id number] DOB:[birth year], 57 y.o., female Today's Date: 07/25/2022  END OF SESSION:  PT End of Session - 07/25/22 0733     Visit Number 6    Date for PT Re-Evaluation 08/19/22    Authorization Type [place] PREFERRED    PT Start Time 0730    PT Stop Time 0800    PT Time Calculation (min) 30 min    Activity Tolerance Patient tolerated treatment well    Behavior During Therapy [facility] for tasks assessed/performed             Past Medical History:  Diagnosis Date   Abnormal Pap smear    Bulging lumbar disc    L4, l5--scheduled for surgery 05-04-18--Dr. Elsner   Hidradenitis    Hyperlipidemia    Infertility, female    no method of birth from late 20's through the 40's,  Headaches on OCP   Migraine    with aura   Sleep apnea 2021   Past Surgical History:  Procedure Laterality Date   ABDOMINAL EXPOSURE N/A 05/04/2018   Procedure: ABDOMINAL EXPOSURE;  Surgeon: Marty Heck, MD;  Location: [place];  Service: Vascular;  Laterality: N/A;   ANKLE SURGERY  1998/1999   left ankle fracture with ORIF   ANTERIOR LUMBAR FUSION N/A 05/04/2018   Procedure: Lumbar Four-Five Anterior  lumbar interbody fusion;  Surgeon: Kristeen Miss, MD;  Location: [place];  Service: Neurosurgery;  Laterality: N/A;  anterior   CARPAL TUNNEL RELEASE  05/21/12   right wrist   COLPOSCOPY     CIN I   HIP SURGERY Left 03-06-13   --[place] from injury   [place]   off back - benign   Patient Active Problem List   Diagnosis Date Noted   Spondylolisthesis of lumbar region 05/04/2018   Degenerative disc disease, lumbar 04/17/2018   History of migraine headaches 01/28/2013     REFERRING PROVIDER: Chyrl Civatte Thane Edu, PA-C   REFERRING DIAG: Spinal stenosis, lumbar region with neurogenic claudication [M48.062]   Rationale for Evaluation and Treatment:  Rehabilitation  THERAPY DIAG:  Muscle weakness (generalized)  Difficulty in walking, not elsewhere classified  Other low back pain  ONSET DATE: December 2023  SUBJECTIVE:                                                                                                                                                                                           SUBJECTIVE STATEMENT: Pt reports that she only had to take 2 pain  pills over the past week.  PERTINENT HISTORY:  Bulging disc (lumbar), Migraines.   PAIN:  Are you having pain? Yes: NPRS scale: 2/10 Pain location: R lower lumbar  Pain description: Isolated trigger point, nagging pain Aggravating factors: Sitting, vacuuming  Relieving factors: Rest   PRECAUTIONS: None  WEIGHT BEARING RESTRICTIONS: No  FALLS:  Has patient fallen in last 6 months? No  LIVING ENVIRONMENT: Lives with: lives with their spouse Lives in: House/apartment Stairs: Yes: Internal: 12 steps; on right going up Has following equipment at home: None  OCCUPATION: Hotel manager for [employer]  PLOF: Independent  PATIENT GOALS: Pt would like to reduce lower back pain.   NEXT MD VISIT:   OBJECTIVE:   DIAGNOSTIC FINDINGS:  None   PATIENT SURVEYS:  Eval:  FOTO 50.79%, 62% goal   POSTURE: No Significant postural limitations  PALPATION: Tenderness isolated to R multifidi and paraspinals (L3-L5).   LUMBAR ROM:   AROM eval  Flexion 75%  Extension 100%  Right lateral flexion 100%  Left lateral flexion 100%  Right rotation 100%  Left rotation 100%   (Blank rows = not tested)  LOWER EXTREMITY ROM:     Active  Right eval Left eval  Hip flexion [facility]  Hip internal rotation [facility]  Hip external rotation [facility]  Knee flexion [facility] WFL  Knee extension WFL WFL   (Blank rows = not tested)  LOWER EXTREMITY MMT:    Pt has functional strength, but has weakness on R LE> L LE.    GAIT: 05/20/2022 Distance walked: 34f  Assistive device  utilized: no device  Level of assistance: Independent  Comments: mild antalgia, normalized  TODAY'S TREATMENT:                                                                                                                               DATE: 07/25/2022 Standing hamstring stretch at stairs 2x20 sec bilat Standing hip flexion with overhead reach 2x10 bilat Nustep level 5 x7 min with PT present to discuss status FWD and side lunging onto bosu 2x10 bilat Quadruped alt UE/LE extension 2x10 Quadruped cat/cow 2x10 Trigger Point Dry-Needling  Treatment instructions: Expect mild to moderate muscle soreness. S/S of pneumothorax if dry needled over a lung field, and to seek immediate medical attention should they occur. Patient verbalized understanding of these instructions and education. Patient Consent Given: Yes Education handout provided: Yes Muscles treated: right lumbar multifidi Electrical stimulation performed: No Parameters: N/A Treatment response/outcome: Utilized skilled palpation to locate/identify trigger points.  Able to illicit twitch response and muscle elongation following. Soft tissue mobilization to piriformis and lumbar paraspinals to promote further elongation.   DATE: 07/18/2022 Standing hamstring stretch at stairs 2x20 sec bilat Standing hip flexion with overhead reach 2x10 bilat Nustep level 5 x5 min with PT present to discuss status FWD and side lunging onto bosu 2x10 bilat Quadruped cat/cow 2x10 3 way child's pose 2x20 sec each direction Prone  alt UE/LE extension 2x10 bilat Trigger Point Dry-Needling  Treatment instructions: Expect mild to moderate muscle soreness. S/S of pneumothorax if dry needled over a lung field, and to seek immediate medical attention should they occur. Patient verbalized understanding of these instructions and education. Patient Consent Given: Yes Education handout provided: Yes Muscles treated: right lumbar multifidi Electrical stimulation  performed: No Parameters: N/A Treatment response/outcome: Utilized skilled palpation to locate/identify trigger points.  Able to illicit twitch response and muscle elongation following. Soft tissue mobilization to piriformis and lumbar paraspinals to promote further elongation.   DATE: 07/12/2022 Standing hamstring stretch at stairs 2x20 sec bilat Standing hip flexion with overhead reach 2x10 bilat Nustep level 5 x5 min with PT present to discuss status Modified dead lift with 10# kettle bell 2x10 Quadruped cat/cow 2x10 3 way child's pose 2x20 sec each direction Prone alt UE/LE extension 2x10 bilat Prone press up 2x10 Trigger Point Dry-Needling  Treatment instructions: Expect mild to moderate muscle soreness. S/S of pneumothorax if dry needled over a lung field, and to seek immediate medical attention should they occur. Patient verbalized understanding of these instructions and education. Patient Consent Given: Yes Education handout provided: Yes Muscles treated: right lumbar multifidi Electrical stimulation performed: No Parameters: N/A Treatment response/outcome: Utilized skilled palpation to locate/identify trigger points.  Able to illicit twitch response and muscle elongation following. Manual Therapy:  Soft tissue mobilization to bilateral lumbar paraspinals and right glutes/piriformis.     PATIENT EDUCATION:  Education details: Educated pt on anatomy and physiology of current symptoms, FOTO, diagnosis, prognosis, HEP,  and POC. Person educated: Patient Education method: Customer service manager Education comprehension: verbalized understanding and returned demonstration  HOME EXERCISE PROGRAM: Access Code: [email or web address] URL: [email or web address] Date: 06/24/2022 Prepared by: Shelby Dubin Kharter Sestak  Exercises - Seated Abdominal Press into [employer]  - 1 x daily - 7 x weekly - 2 sets - 10 reps - Supine Posterior Pelvic Tilt  - 1 x daily - 7 x weekly - 2 sets - 10  reps - Supine Lower Trunk Rotation with Swiss Ball  - 1 x daily - 7 x weekly - 2 sets - 10 reps - Supine Hip and Knee Flexion AROM with Swiss Ball  - 1 x daily - 7 x weekly - 2 sets - 10 reps - Static Prone on Elbows  - 1 x daily - 7 x weekly - 1 sets - 2 reps - 1-2 min hold - Prone Press Up  - 1 x daily - 7 x weekly - 2 sets - 10 reps - Prone Hip Extension  - 1 x daily - 7 x weekly - 2 sets - 10 reps - Child's Pose Stretch  - 1 x daily - 7 x weekly - 1 sets - 2 reps - 20 sec hold   ASSESSMENT:  CLINICAL IMPRESSION:  Kelsey [name] presents to skilled PT with continued reports of feeling overall better.  Patient with primary trigger point noted on lower right lumbar multifidi.  Patient continues to progress with strengthening and core stability throughout session with decreased pain and improved technique.   OBJECTIVE IMPAIRMENTS: decreased activity tolerance, difficulty walking, decreased balance, decreased endurance, decreased mobility, decreased ROM, decreased strength, impaired flexibility, impaired UE/LE use, postural dysfunction, and pain.  ACTIVITY LIMITATIONS: bending, lifting, carry, locomotion, cleaning, community activity, driving, and or occupation  PERSONAL FACTORS: Bulging disc (lumbar), Migraines. are also affecting patient's functional outcome.  REHAB POTENTIAL: Good  CLINICAL DECISION MAKING: Stable/uncomplicated  EVALUATION COMPLEXITY: Low    GOALS:  Short term PT Goals Target date: 06/03/2022 Pt will be I and compliant with HEP. Baseline:  Goal status: MET on 06/24/2022  Pt will decrease pain by 25% overall Baseline: Goal status: MET on 06/24/2022  Long term PT goals Target date: 08/12/2022 Pt will walk with improved gait pattern with no reported pain in her R lower back.        Baseline:        Goal status: ONGOING        2. Pt will report self management of lower back symptoms <2/10                   Baseline:       Goal status: ONGOING  PLAN: PT FREQUENCY: Pt  plans to come in as needed for DN  PT DURATION: 12 weeks  PLANNED INTERVENTIONS (unless contraindicated): aquatic PT, Canalith repositioning, cryotherapy, Electrical stimulation, Iontophoresis with 4 mg/ml dexamethasome, Moist heat, traction, Ultrasound, gait training, Therapeutic exercise, balance training, neuromuscular re-education, patient/family education, prosthetic training, manual techniques, passive ROM, dry needling, taping, vasopnuematic device, vestibular, spinal manipulations, joint manipulations  PLAN FOR NEXT SESSION: FOTO, assess and progress HEP as indicated, strengthening/core stability, flexibility, lumbar extension, manual/dry needling as indicated.   Juel Burrow, PT 07/25/2022, 10:43 AM   [facility] [address] [place], [place] [address] Phone # [phone] Fax [phone]

## 2022-08-01 ENCOUNTER — Encounter: Payer: Self-pay | Admitting: Rehabilitative and Restorative Service Providers"

## 2022-08-01 ENCOUNTER — Ambulatory Visit: Payer: No Typology Code available for payment source | Admitting: Rehabilitative and Restorative Service Providers"

## 2022-08-01 DIAGNOSIS — R262 Difficulty in walking, not elsewhere classified: Secondary | ICD-10-CM

## 2022-08-01 DIAGNOSIS — M5459 Other low back pain: Secondary | ICD-10-CM

## 2022-08-01 DIAGNOSIS — M6281 Muscle weakness (generalized): Secondary | ICD-10-CM

## 2022-08-01 NOTE — Therapy (Signed)
OUTPATIENT PHYSICAL THERAPY TREATMENT NOTE   Patient Name: Kelsey [name] MRN: [id number] DOB:[birth year], 57 y.o., female Today's Date: 08/01/2022  END OF SESSION:  PT End of Session - 08/01/22 0732     Visit Number 7    Date for PT Re-Evaluation 08/19/22    Authorization Type [place] PREFERRED    PT Start Time 0730    PT Stop Time 0800    PT Time Calculation (min) 30 min    Activity Tolerance Patient tolerated treatment well    Behavior During Therapy [facility] for tasks assessed/performed             Past Medical History:  Diagnosis Date   Abnormal Pap smear    Bulging lumbar disc    L4, l5--scheduled for surgery 05-04-18--Dr. Elsner   Hidradenitis    Hyperlipidemia    Infertility, female    no method of birth from late 20's through the 40's,  Headaches on OCP   Migraine    with aura   Sleep apnea 2021   Past Surgical History:  Procedure Laterality Date   ABDOMINAL EXPOSURE N/A 05/04/2018   Procedure: ABDOMINAL EXPOSURE;  Surgeon: Marty Heck, MD;  Location: [place];  Service: Vascular;  Laterality: N/A;   ANKLE SURGERY  1998/1999   left ankle fracture with ORIF   ANTERIOR LUMBAR FUSION N/A 05/04/2018   Procedure: Lumbar Four-Five Anterior  lumbar interbody fusion;  Surgeon: Kristeen Miss, MD;  Location: [place];  Service: Neurosurgery;  Laterality: N/A;  anterior   CARPAL TUNNEL RELEASE  05/21/12   right wrist   COLPOSCOPY     CIN I   HIP SURGERY Left 03-06-13   --[place] from injury   [place]   off back - benign   Patient Active Problem List   Diagnosis Date Noted   Spondylolisthesis of lumbar region 05/04/2018   Degenerative disc disease, lumbar 04/17/2018   History of migraine headaches 01/28/2013     REFERRING PROVIDER: Chyrl Civatte Thane Edu, PA-C   REFERRING DIAG: Spinal stenosis, lumbar region with neurogenic claudication [M48.062]   Rationale for Evaluation and Treatment:  Rehabilitation  THERAPY DIAG:  Muscle weakness (generalized)  Difficulty in walking, not elsewhere classified  Other low back pain  ONSET DATE: December 2023  SUBJECTIVE:                                                                                                                                                                                           SUBJECTIVE STATEMENT: Pt reports that she was doing well prior to Friday.  States that she had to help move her mother in ALF on Friday and has had some soreness since that time.  PERTINENT HISTORY:  Bulging disc (lumbar), Migraines.   PAIN:  Are you having pain? Yes: NPRS scale: 5-6/10 Pain location: R lower lumbar  Pain description: Isolated trigger point, nagging pain Aggravating factors: Sitting, vacuuming  Relieving factors: Rest   PRECAUTIONS: None  WEIGHT BEARING RESTRICTIONS: No  FALLS:  Has patient fallen in last 6 months? No  LIVING ENVIRONMENT: Lives with: lives with their spouse Lives in: House/apartment Stairs: Yes: Internal: 12 steps; on right going up Has following equipment at home: None  OCCUPATION: Hotel manager for [employer]  PLOF: Independent  PATIENT GOALS: Pt would like to reduce lower back pain.   NEXT MD VISIT:   OBJECTIVE:   DIAGNOSTIC FINDINGS:  None   PATIENT SURVEYS:  Eval:  FOTO 50.79%, 62% goal 08/01/2022:  FOTO 63%   POSTURE: No Significant postural limitations  PALPATION: Tenderness isolated to R multifidi and paraspinals (L3-L5).   LUMBAR ROM:   AROM eval  Flexion 75%  Extension 100%  Right lateral flexion 100%  Left lateral flexion 100%  Right rotation 100%  Left rotation 100%   (Blank rows = not tested)  LOWER EXTREMITY ROM:     Active  Right eval Left eval  Hip flexion [facility]  Hip internal rotation [facility]  Hip external rotation [facility]  Knee flexion [facility] WFL  Knee extension WFL WFL   (Blank rows = not tested)  LOWER EXTREMITY MMT:    Pt has  functional strength, but has weakness on R LE> L LE.    GAIT: 05/20/2022 Distance walked: 49ft  Assistive device utilized: no device  Level of assistance: Independent  Comments: mild antalgia, normalized  TODAY'S TREATMENT:                                                                                                                               DATE: 08/01/2022 Nustep level 5 x5 min with PT present to discuss status Review of importance of core stability and stretching following the increased activity of moving her mother Standing hamstring stretch at stairs 2x20 sec bilat Standing hip flexion with overhead reach 2x10 bilat FOTO 63% FWD and side lunging onto bosu x10 bilat Trigger Point Dry-Needling  Treatment instructions: Expect mild to moderate muscle soreness. S/S of pneumothorax if dry needled over a lung field, and to seek immediate medical attention should they occur. Patient verbalized understanding of these instructions and education. Patient Consent Given: Yes Education handout provided: Yes Muscles treated: right lumbar multifidi Electrical stimulation performed: No Parameters: N/A Treatment response/outcome: Utilized skilled palpation to locate/identify trigger points.  Able to illicit twitch response and muscle elongation following. Soft tissue mobilization to piriformis and lumbar paraspinals to promote further elongation.   DATE: 07/25/2022 Standing hamstring stretch at stairs 2x20 sec bilat Standing hip flexion with overhead reach 2x10 bilat Nustep  level 5 x7 min with PT present to discuss status FWD and side lunging onto bosu 2x10 bilat Quadruped alt UE/LE extension 2x10 Quadruped cat/cow 2x10 Trigger Point Dry-Needling  Treatment instructions: Expect mild to moderate muscle soreness. S/S of pneumothorax if dry needled over a lung field, and to seek immediate medical attention should they occur. Patient verbalized understanding of these instructions and  education. Patient Consent Given: Yes Education handout provided: Yes Muscles treated: right lumbar multifidi Electrical stimulation performed: No Parameters: N/A Treatment response/outcome: Utilized skilled palpation to locate/identify trigger points.  Able to illicit twitch response and muscle elongation following. Soft tissue mobilization to piriformis and lumbar paraspinals to promote further elongation.   DATE: 07/18/2022 Standing hamstring stretch at stairs 2x20 sec bilat Standing hip flexion with overhead reach 2x10 bilat Nustep level 5 x5 min with PT present to discuss status FWD and side lunging onto bosu 2x10 bilat Quadruped cat/cow 2x10 3 way child's pose 2x20 sec each direction Prone alt UE/LE extension 2x10 bilat Trigger Point Dry-Needling  Treatment instructions: Expect mild to moderate muscle soreness. S/S of pneumothorax if dry needled over a lung field, and to seek immediate medical attention should they occur. Patient verbalized understanding of these instructions and education. Patient Consent Given: Yes Education handout provided: Yes Muscles treated: right lumbar multifidi Electrical stimulation performed: No Parameters: N/A Treatment response/outcome: Utilized skilled palpation to locate/identify trigger points.  Able to illicit twitch response and muscle elongation following. Soft tissue mobilization to piriformis and lumbar paraspinals to promote further elongation.      PATIENT EDUCATION:  Education details: Educated pt on anatomy and physiology of current symptoms, FOTO, diagnosis, prognosis, HEP,  and POC. Person educated: Patient Education method: Customer service manager Education comprehension: verbalized understanding and returned demonstration  HOME EXERCISE PROGRAM: Access Code: [email or web address] URL: [email or web address] Date: 06/24/2022 Prepared by: Shelby Dubin Hoby Kawai  Exercises - Seated Abdominal Press into [employer]  - 1 x daily - 7  x weekly - 2 sets - 10 reps - Supine Posterior Pelvic Tilt  - 1 x daily - 7 x weekly - 2 sets - 10 reps - Supine Lower Trunk Rotation with Swiss Ball  - 1 x daily - 7 x weekly - 2 sets - 10 reps - Supine Hip and Knee Flexion AROM with Swiss Ball  - 1 x daily - 7 x weekly - 2 sets - 10 reps - Static Prone on Elbows  - 1 x daily - 7 x weekly - 1 sets - 2 reps - 1-2 min hold - Prone Press Up  - 1 x daily - 7 x weekly - 2 sets - 10 reps - Prone Hip Extension  - 1 x daily - 7 x weekly - 2 sets - 10 reps - Child's Pose Stretch  - 1 x daily - 7 x weekly - 1 sets - 2 reps - 20 sec hold   ASSESSMENT:  CLINICAL IMPRESSION:  Ms Duddy presents to skilled PT with continued reports of some increased pain secondary to helping her mother move.  Did note increased twitch response during dry needling.  FOTO goal has been met since initial eval.  Patient able to progress with continued stretching and exercises.  Patient continues to require skilled PT to progress towards goal related activities.   OBJECTIVE IMPAIRMENTS: decreased activity tolerance, difficulty walking, decreased balance, decreased endurance, decreased mobility, decreased ROM, decreased strength, impaired flexibility, impaired UE/LE use, postural dysfunction, and pain.  ACTIVITY LIMITATIONS: bending, lifting, carry, locomotion, cleaning, community activity,  driving, and or occupation  PERSONAL FACTORS: Bulging disc (lumbar), Migraines. are also affecting patient's functional outcome.  REHAB POTENTIAL: Good  CLINICAL DECISION MAKING: Stable/uncomplicated  EVALUATION COMPLEXITY: Low    GOALS: Short term PT Goals Target date: 06/03/2022 Pt will be I and compliant with HEP. Baseline:  Goal status: MET on 06/24/2022  Pt will decrease pain by 25% overall Baseline: Goal status: MET on 06/24/2022  Long term PT goals Target date: 08/12/2022 Pt will walk with improved gait pattern with no reported pain in her R lower back.        Baseline:         Goal status: ONGOING        2. Pt will report self management of lower back symptoms <2/10                   Baseline:       Goal status: ONGOING  PLAN: PT FREQUENCY: Pt plans to come in as needed for DN  PT DURATION: 12 weeks  PLANNED INTERVENTIONS (unless contraindicated): aquatic PT, Canalith repositioning, cryotherapy, Electrical stimulation, Iontophoresis with 4 mg/ml dexamethasome, Moist heat, traction, Ultrasound, gait training, Therapeutic exercise, balance training, neuromuscular re-education, patient/family education, prosthetic training, manual techniques, passive ROM, dry needling, taping, vasopnuematic device, vestibular, spinal manipulations, joint manipulations  PLAN FOR NEXT SESSION:  assess and progress HEP as indicated, strengthening/core stability, flexibility, lumbar extension, manual/dry needling as indicated.   Juel Burrow, PT 08/01/2022, 10:14 AM   [facility] [address] [place], [place] [address] Phone # [phone] Fax [phone]

## 2022-08-07 ENCOUNTER — Encounter: Payer: Self-pay | Admitting: Physical Therapy

## 2022-08-08 ENCOUNTER — Ambulatory Visit: Payer: No Typology Code available for payment source

## 2022-08-11 NOTE — Progress Notes (Signed)
57 y.o. G63P0000 Married Caucasian female here for annual exam.    Working on wellness with Dr. Jeanice Lim.  Working on weight loss.   Doing dry needling for her back pain.   Off Micronor pills.  FSH 67 on 09/02/21. Occasional hot flash. No bleeding or spotting.   Brother died last year.  PCP:   Gweneth Dimitri, MD  Patient's last menstrual period was 08/14/2020 (exact date).           Sexually active: No.  The current method of family planning is post menopausal status.    Exercising: Yes.     PT for hip and back, walking Smoker:  no  Health Maintenance: Pap:  08/18/20 neg: HR HPV neg, 02/17/16 neg:HR HPV neg History of abnormal Pap:  yes, 2002, 2003 MMG:  06/29/22 Breast Density Category A, BI-RADS CAT 1 neg Colonoscopy:  2021 polyp - due in 2026. BMD:   n/a  Result  n/a TDaP:  02/10/15 Gardasil:   no HIV: n/a Hep C: n/a Screening Labs:  PCP   reports that she has never smoked. She has never used smokeless tobacco. She reports current alcohol use of about 1.0 standard drink of alcohol per week. She reports that she does not use drugs.  Past Medical History:  Diagnosis Date   Abnormal Pap smear    Bulging lumbar disc    L4, l5--scheduled for surgery 05-04-18--Dr. Elsner   Hidradenitis    Hyperlipidemia    Infertility, female    no method of birth from late 20's through the 40's,  Headaches on OCP   Migraine    with aura   Sleep apnea 2021    Past Surgical History:  Procedure Laterality Date   ABDOMINAL EXPOSURE N/A 05/04/2018   Procedure: ABDOMINAL EXPOSURE;  Surgeon: Cephus Shelling, MD;  Location: [facility] OR;  Service: Vascular;  Laterality: N/A;   ANKLE SURGERY  1998/1999   left ankle fracture with ORIF   ANTERIOR LUMBAR FUSION N/A 05/04/2018   Procedure: Lumbar Four-Five Anterior  lumbar interbody fusion;  Surgeon: Barnett Abu, MD;  Location: [facility] OR;  Service: Neurosurgery;  Laterality: N/A;  anterior   CARPAL TUNNEL RELEASE  05/21/2012   right wrist    COLPOSCOPY     CIN I   HIP SURGERY Left 03/06/2013   --[facility] - Torn Labrum from injury   LASIK     MOLE REMOVAL  05/16/1998   off back - benign   right labrum repair Right 10/28/2021    Current Outpatient Medications  Medication Sig Dispense Refill   amitriptyline (ELAVIL) 10 MG tablet Take 10 mg by mouth at bedtime.   3   BLACK ELDERBERRY PO Take 1 tablet by mouth daily.     docusate sodium (COLACE) 100 MG capsule      esomeprazole (NEXIUM) 20 MG capsule Take 20 mg by mouth daily at 12 noon.     famotidine (PEPCID) 20 MG tablet Take by mouth.     GELATIN PO Take by mouth.     Isometheptene-Dichloral-APAP (MIDRIN PO) Take 1 tablet by mouth as needed.     Magnesium 500 MG CAPS Take 1 capsule by mouth daily.     MELATONIN PO Take by mouth.     methocarbamol (ROBAXIN) 500 MG tablet Take 1 tablet (500 mg total) by mouth every 6 (six) hours as needed for muscle spasms. 60 tablet 1   Multiple Vitamins-Minerals (CENTRUM ULTRA WOMENS PO) Take 1 tablet by mouth daily.  Omega-3 Fatty Acids (FISH OIL) 1000 MG CAPS Take 2 capsule by mouth once daily.     ondansetron (ZOFRAN-ODT) 4 MG disintegrating tablet Take 1 tablet by mouth as needed. Takes if has migraine     oxyCODONE-acetaminophen (PERCOCET/ROXICET) 5-325 MG tablet Take 1 tablet by mouth every 4 (four) hours as needed for severe pain.     Probiotic Product (PROBIOTIC-10 PO) Take by mouth.     promethazine (PHENERGAN) 25 MG tablet Take 1/2 to 1 tablet by mouth every 6 hours as needed for nausea     rosuvastatin (CRESTOR) 10 MG tablet rosuvastatin 10 mg tablet  TAKE 1 TABLET BY MOUTH EVERY DAY FOR 90 DAYS     Vitamin D, Ergocalciferol, (DRISDOL) 1.25 MG (50000 UNIT) CAPS capsule Take 1 capsule by mouth once a week for 90 days     WELLBUTRIN XL 300 MG 24 hr tablet 1 tablet in the morning Orally Once a day for 90 days     zolpidem (AMBIEN) 5 MG tablet Take by mouth.     doxycycline (VIBRA-TABS) 100 MG tablet Take 100 mg by mouth 2  (two) times daily as needed (irritation).  (Patient not taking: Reported on 08/19/2021)  3   No current facility-administered medications for this visit.    Family History  Problem Relation Age of Onset   Diabetes Mother    Alzheimer's disease Mother    Hypertension Father    Diabetes Father    Heart disease Father    Heart failure Father    Sleep apnea Father    Cancer Brother        Peritoneal cancer   Hypertension Brother    Colon cancer Brother [phone]       Colectomy with Appendectomy   Sleep apnea Brother    Diabetes Brother    Alcohol abuse Brother    Alzheimer's disease Maternal Aunt    Alzheimer's disease Maternal Grandmother     Review of Systems  All other systems reviewed and are negative.   Exam:   BP 122/84 (BP Location: Left Arm, Patient Position: Sitting, Cuff Size: Large)   Pulse 71   Ht 5' 3.5" (1.613 m)   Wt 221 lb (100.2 kg)   LMP 08/14/2020 (Exact Date) Comment: No periods in a year  SpO2 98%   BMI 38.53 kg/m     General appearance: alert, cooperative and appears stated age Head: normocephalic, without obvious abnormality, atraumatic Neck: no adenopathy, supple, symmetrical, trachea midline and thyroid normal to inspection and palpation Lungs: clear to auscultation bilaterally Breasts: normal appearance, no masses or tenderness, No nipple retraction or dimpling, No nipple discharge or bleeding, No axillary adenopathy Heart: regular rate and rhythm Abdomen: soft, non-tender; no masses, no organomegaly Extremities: extremities normal, atraumatic, no cyanosis or edema Skin: skin color, texture, turgor normal. No rashes or lesions Lymph nodes: cervical, supraclavicular, and axillary nodes normal. Neurologic: grossly normal  Pelvic: External genitalia:  no lesions              No abnormal inguinal nodes palpated.              Urethra:  normal appearing urethra with no masses, tenderness or lesions              Bartholins and Skenes: normal                  Vagina: normal appearing vagina with normal color and discharge, no lesions  Cervix: no lesions              Pap taken: no Bimanual Exam:  Uterus:  normal size, contour, position, consistency, mobility, non-tender              Adnexa: no mass, fullness, tenderness              Rectal exam: yes.  Confirms.              Anus:  normal sphincter tone, no lesions  Chaperone was present for exam:  Warren Lacymily F, CMA  Assessment:   Well woman visit with gynecologic exam. Remote hx CIN I.  Migraine with aura.  Hx low vit D.  Hidradenitis.   FH colon cancer - brother.   Plan: Mammogram screening discussed. Self breast awareness reviewed. Pap and HR HPV 2027. Guidelines for Calcium, Vitamin D, regular exercise program including cardiovascular and weight bearing exercise. Labs with PCP.  Follow up annually and prn.

## 2022-08-15 ENCOUNTER — Ambulatory Visit
Payer: No Typology Code available for payment source | Attending: Neurosurgery | Admitting: Rehabilitative and Restorative Service Providers"

## 2022-08-15 ENCOUNTER — Encounter: Payer: Self-pay | Admitting: Rehabilitative and Restorative Service Providers"

## 2022-08-15 DIAGNOSIS — R262 Difficulty in walking, not elsewhere classified: Secondary | ICD-10-CM | POA: Insufficient documentation

## 2022-08-15 DIAGNOSIS — M6281 Muscle weakness (generalized): Secondary | ICD-10-CM | POA: Diagnosis present

## 2022-08-15 DIAGNOSIS — M5459 Other low back pain: Secondary | ICD-10-CM | POA: Insufficient documentation

## 2022-08-15 NOTE — Therapy (Signed)
OUTPATIENT PHYSICAL THERAPY TREATMENT NOTE AND DISCHARGE SUMMARY   Patient Name: Kelsey [name] MRN: [id number] DOB:1966/03/07, 57 y.o., female Today's Date: 08/15/2022  END OF SESSION:  PT End of Session - 08/15/22 0735     Visit Number 8    Date for PT Re-Evaluation 08/19/22    Authorization Type [place] PREFERRED    PT Start Time 0731    PT Stop Time 0800    PT Time Calculation (min) 29 min    Activity Tolerance Patient tolerated treatment well    Behavior During Therapy [facility] for tasks assessed/performed             Past Medical History:  Diagnosis Date   Abnormal Pap smear    Bulging lumbar disc    L4, l5--scheduled for surgery 05-04-18--Dr. Elsner   Hidradenitis    Hyperlipidemia    Infertility, female    no method of birth from late 20's through the 40's,  Headaches on OCP   Migraine    with aura   Sleep apnea 2021   Past Surgical History:  Procedure Laterality Date   ABDOMINAL EXPOSURE N/A 05/04/2018   Procedure: ABDOMINAL EXPOSURE;  Surgeon: Marty Heck, MD;  Location: [place];  Service: Vascular;  Laterality: N/A;   ANKLE SURGERY  1998/1999   left ankle fracture with ORIF   ANTERIOR LUMBAR FUSION N/A 05/04/2018   Procedure: Lumbar Four-Five Anterior  lumbar interbody fusion;  Surgeon: Kristeen Miss, MD;  Location: [place];  Service: Neurosurgery;  Laterality: N/A;  anterior   CARPAL TUNNEL RELEASE  05/21/12   right wrist   COLPOSCOPY     CIN I   HIP SURGERY Left 03-06-13   --[place] from injury   [place]   off back - benign   Patient Active Problem List   Diagnosis Date Noted   Spondylolisthesis of lumbar region 05/04/2018   Degenerative disc disease, lumbar 04/17/2018   History of migraine headaches 01/28/2013     REFERRING PROVIDER: Chyrl Civatte Thane Edu, PA-C   REFERRING DIAG: Spinal stenosis, lumbar region with neurogenic claudication [M48.062]   Rationale for Evaluation and Treatment:  Rehabilitation  THERAPY DIAG:  Muscle weakness (generalized)  Difficulty in walking, not elsewhere classified  Other low back pain  ONSET DATE: December 2023  SUBJECTIVE:                                                                                                                                                                                           SUBJECTIVE STATEMENT: Pt reports that she had a busy  weekend and did some yard work.  Pt reports that she thinks that she is where she is going to be for a while and that overall, she is not taking the pain medication as much as she was initially.  PERTINENT HISTORY:  Bulging disc (lumbar), Migraines.   PAIN:  Are you having pain? Yes: NPRS scale: 5-6/10 Pain location: R lower lumbar  Pain description: Isolated trigger point, nagging pain Aggravating factors: Sitting, vacuuming  Relieving factors: Rest   PRECAUTIONS: None  WEIGHT BEARING RESTRICTIONS: No  FALLS:  Has patient fallen in last 6 months? No  LIVING ENVIRONMENT: Lives with: lives with their spouse Lives in: House/apartment Stairs: Yes: Internal: 12 steps; on right going up Has following equipment at home: None  OCCUPATION: Hotel manager for [employer]  PLOF: Independent  PATIENT GOALS: Pt would like to reduce lower back pain.   NEXT MD VISIT:   OBJECTIVE:   DIAGNOSTIC FINDINGS:  None   PATIENT SURVEYS:  Eval:  FOTO 50.79%, 62% goal 08/01/2022:  FOTO 63%   POSTURE: No Significant postural limitations  PALPATION: Tenderness isolated to R multifidi and paraspinals (L3-L5).   LUMBAR ROM:   AROM eval  Flexion 75%  Extension 100%  Right lateral flexion 100%  Left lateral flexion 100%  Right rotation 100%  Left rotation 100%   (Blank rows = not tested)  LOWER EXTREMITY ROM:     Active  Right eval Left eval  Hip flexion [facility]  Hip internal rotation [facility]  Hip external rotation [facility]  Knee flexion [facility] WFL  Knee extension WFL WFL    (Blank rows = not tested)  LOWER EXTREMITY MMT:    Pt has functional strength, but has weakness on R LE> L LE.    GAIT: 05/20/2022 Distance walked: 59ft  Assistive device utilized: no device  Level of assistance: Independent  Comments: mild antalgia, normalized  TODAY'S TREATMENT:                                                                                                                               DATE: 08/15/2022 Nustep level 5 x5 min with PT present to discuss status Standing hamstring stretch at stairs 2x20 sec bilat Standing hip flexion with overhead reach 2x10 bilat Trigger Point Dry-Needling  Treatment instructions: Expect mild to moderate muscle soreness. S/S of pneumothorax if dry needled over a lung field, and to seek immediate medical attention should they occur. Patient verbalized understanding of these instructions and education. Patient Consent Given: Yes Education handout provided: Yes Muscles treated: right lumbar multifidi, right piriformis Electrical stimulation performed: No Parameters: N/A Treatment response/outcome: Utilized skilled palpation to locate/identify trigger points.  Able to illicit twitch response and muscle elongation following. Soft tissue mobilization to piriformis and lumbar paraspinals to promote further elongation. FWD and side lunging onto bosu 2x10 bilat Resisted backwards gait with 10# cable pulley 2x10   DATE: 08/01/2022 Nustep level 5 x5 min with  PT present to discuss status Review of importance of core stability and stretching following the increased activity of moving her mother Standing hamstring stretch at stairs 2x20 sec bilat Standing hip flexion with overhead reach 2x10 bilat FOTO 63% FWD and side lunging onto bosu x10 bilat Trigger Point Dry-Needling  Treatment instructions: Expect mild to moderate muscle soreness. S/S of pneumothorax if dry needled over a lung field, and to seek immediate medical attention should they  occur. Patient verbalized understanding of these instructions and education. Patient Consent Given: Yes Education handout provided: Yes Muscles treated: right lumbar multifidi Electrical stimulation performed: No Parameters: N/A Treatment response/outcome: Utilized skilled palpation to locate/identify trigger points.  Able to illicit twitch response and muscle elongation following. Soft tissue mobilization to piriformis and lumbar paraspinals to promote further elongation.   DATE: 07/25/2022 Standing hamstring stretch at stairs 2x20 sec bilat Standing hip flexion with overhead reach 2x10 bilat Nustep level 5 x7 min with PT present to discuss status FWD and side lunging onto bosu 2x10 bilat Quadruped alt UE/LE extension 2x10 Quadruped cat/cow 2x10 Trigger Point Dry-Needling  Treatment instructions: Expect mild to moderate muscle soreness. S/S of pneumothorax if dry needled over a lung field, and to seek immediate medical attention should they occur. Patient verbalized understanding of these instructions and education. Patient Consent Given: Yes Education handout provided: Yes Muscles treated: right lumbar multifidi Electrical stimulation performed: No Parameters: N/A Treatment response/outcome: Utilized skilled palpation to locate/identify trigger points.  Able to illicit twitch response and muscle elongation following. Soft tissue mobilization to piriformis and lumbar paraspinals to promote further elongation.      PATIENT EDUCATION:  Education details: Educated pt on anatomy and physiology of current symptoms, FOTO, diagnosis, prognosis, HEP,  and POC. Person educated: Patient Education method: Customer service manager Education comprehension: verbalized understanding and returned demonstration  HOME EXERCISE PROGRAM: Access Code: [email or web address] URL: [email or web address] Date: 06/24/2022 Prepared by: Shelby Dubin Levorn Oleski  Exercises - Seated Abdominal Press into [employer]  - 1 x daily - 7 x weekly - 2 sets - 10 reps - Supine Posterior Pelvic Tilt  - 1 x daily - 7 x weekly - 2 sets - 10 reps - Supine Lower Trunk Rotation with Swiss Ball  - 1 x daily - 7 x weekly - 2 sets - 10 reps - Supine Hip and Knee Flexion AROM with Swiss Ball  - 1 x daily - 7 x weekly - 2 sets - 10 reps - Static Prone on Elbows  - 1 x daily - 7 x weekly - 1 sets - 2 reps - 1-2 min hold - Prone Press Up  - 1 x daily - 7 x weekly - 2 sets - 10 reps - Prone Hip Extension  - 1 x daily - 7 x weekly - 2 sets - 10 reps - Child's Pose Stretch  - 1 x daily - 7 x weekly - 1 sets - 2 reps - 20 sec hold   ASSESSMENT:  CLINICAL IMPRESSION:  Ms Golec presents to skilled PT with continued reports of overall improvements stating that she does not need to take her pain medication as much as she did initially. Patient continues to have relief with dry needling, but had increased pain at the beginning of the session due to doing yard work over the weekend.  Patient overall has met goals at this time and is ready to continue with HEP at home.  Patient discharged from outpatient PT at this time to continue with HEP.  OBJECTIVE IMPAIRMENTS: decreased activity tolerance, difficulty walking, decreased balance, decreased endurance, decreased mobility, decreased ROM, decreased strength, impaired flexibility, impaired UE/LE use, postural dysfunction, and pain.  ACTIVITY LIMITATIONS: bending, lifting, carry, locomotion, cleaning, community activity, driving, and or occupation  PERSONAL FACTORS: Bulging disc (lumbar), Migraines. are also affecting patient's functional outcome.  REHAB POTENTIAL: Good  CLINICAL DECISION MAKING: Stable/uncomplicated  EVALUATION COMPLEXITY: Low    GOALS: Short term PT Goals Target date: 06/03/2022 Pt will be I and compliant with HEP. Baseline:  Goal status: MET on 06/24/2022  Pt will decrease pain by 25% overall Baseline: Goal status: MET on 06/24/2022  Long term PT goals  Target date: 08/12/2022 Pt will walk with improved gait pattern with no reported pain in her R lower back.        Baseline:        Goal status: MET on 08/15/2022        2. Pt will report self management of lower back symptoms <2/10                   Baseline:       Goal status: PARTIALLY MET  PLAN: PT FREQUENCY: Pt plans to come in as needed for DN  PT DURATION: 12 weeks  PLANNED INTERVENTIONS (unless contraindicated): aquatic PT, Canalith repositioning, cryotherapy, Electrical stimulation, Iontophoresis with 4 mg/ml dexamethasome, Moist heat, traction, Ultrasound, gait training, Therapeutic exercise, balance training, neuromuscular re-education, patient/family education, prosthetic training, manual techniques, passive ROM, dry needling, taping, vasopnuematic device, vestibular, spinal manipulations, joint manipulations   PHYSICAL THERAPY DISCHARGE SUMMARY   Patient agrees to discharge. Patient goals were partially met.  The only goal not met was for pain, but pt is overall having less pain, just more pain today secondary to doing yard work over the weekend. Patient is being discharged due to being pleased with the current functional level.     Juel Burrow, PT 08/15/2022, 8:32 AM   [facility] [address] [place], [place] [address] Phone # [phone] Fax [phone]

## 2022-08-25 ENCOUNTER — Ambulatory Visit (INDEPENDENT_AMBULATORY_CARE_PROVIDER_SITE_OTHER): Payer: No Typology Code available for payment source | Admitting: Obstetrics and Gynecology

## 2022-08-25 ENCOUNTER — Encounter: Payer: Self-pay | Admitting: Obstetrics and Gynecology

## 2022-08-25 VITALS — BP 122/84 | HR 71 | Ht 63.5 in | Wt 221.0 lb

## 2022-08-25 DIAGNOSIS — Z01419 Encounter for gynecological examination (general) (routine) without abnormal findings: Secondary | ICD-10-CM | POA: Diagnosis not present

## 2022-08-25 NOTE — Patient Instructions (Signed)

## 2022-11-14 IMAGING — MG MM DIGITAL SCREENING BILAT W/ TOMO AND CAD
6 of 10 series · 6 of 30 positions shown · non-contrast
Comparison: Previous exam(s).

CLINICAL DATA: Screening.

EXAM:
DIGITAL SCREENING BILATERAL MAMMOGRAM WITH TOMOSYNTHESIS AND CAD
TECHNIQUE: Bilateral screening digital craniocaudal and mediolateral oblique
mammograms were obtained. Bilateral screening digital breast
tomosynthesis was performed. The images were evaluated with
computer-aided detection.

[L CC synth-2D]
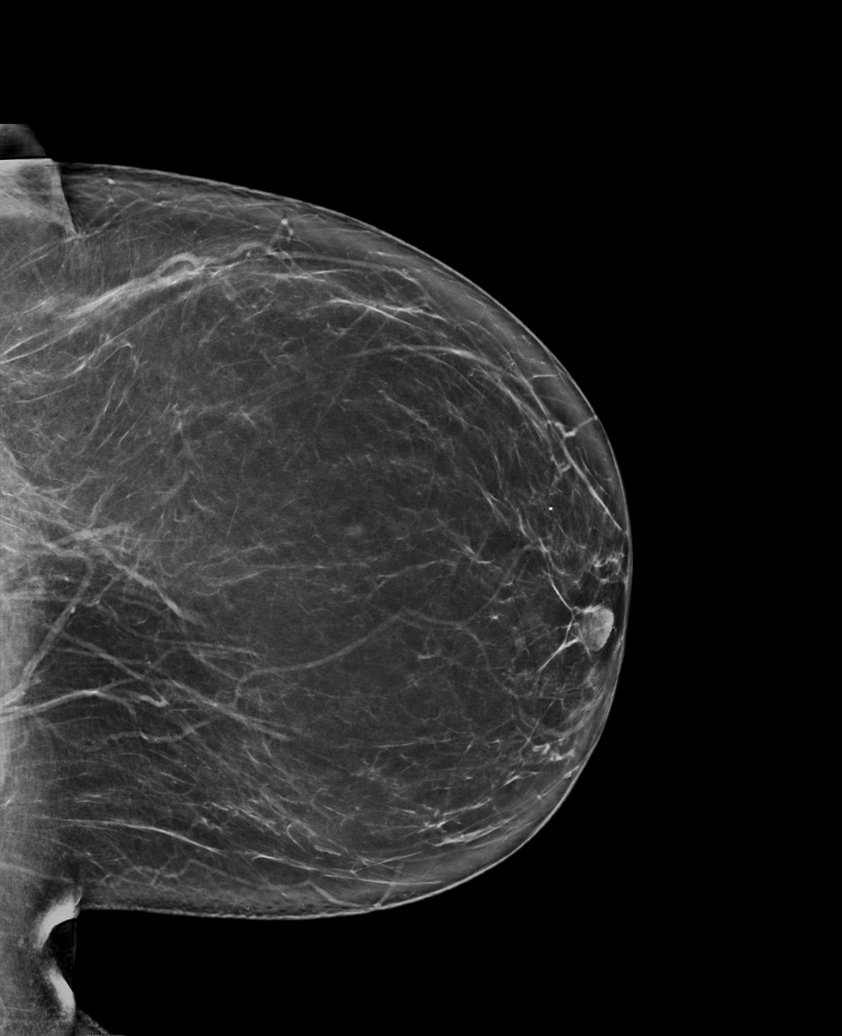

[R CC synth-2D]
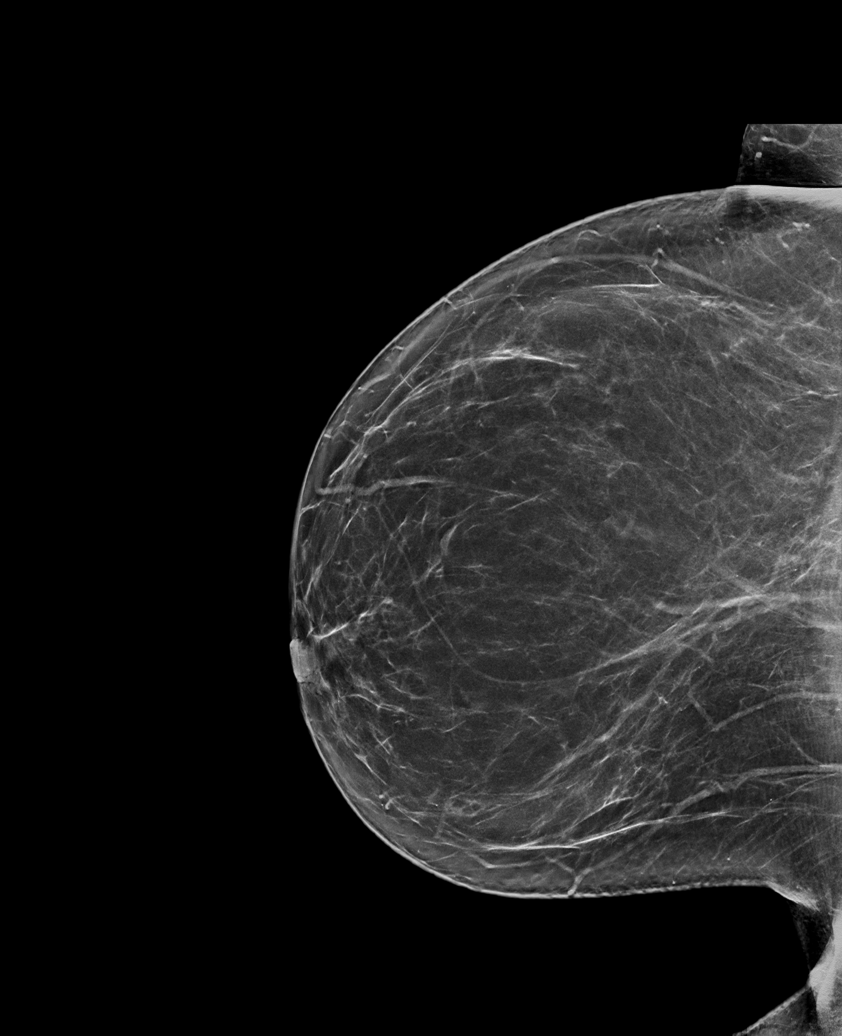

[L MLO synth-2D (1 of 2)]
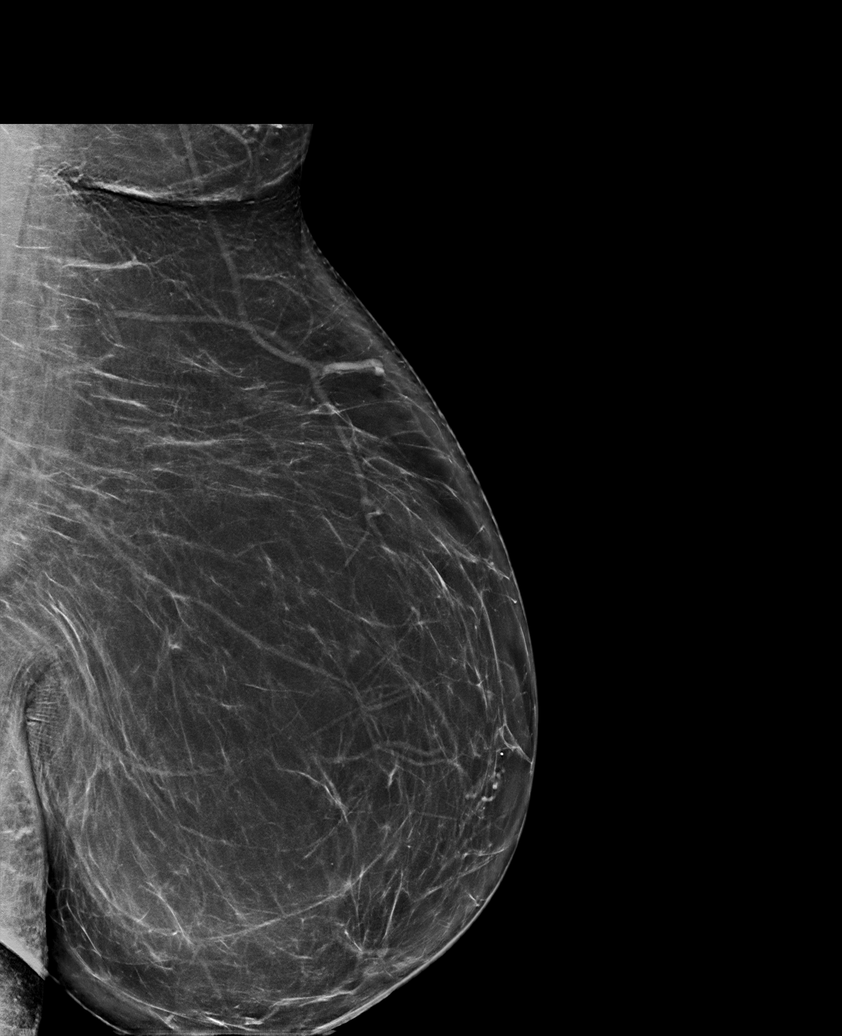

[R MLO synth-2D]
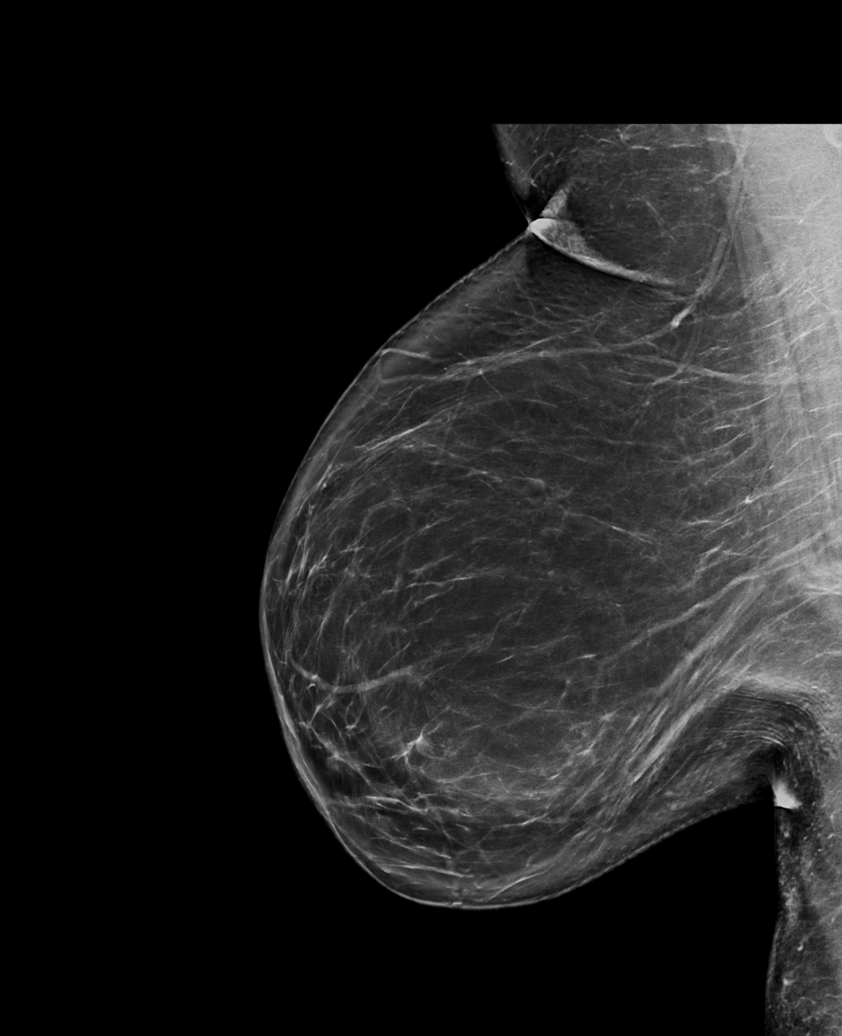

[L MLO synth-2D (2 of 2)]
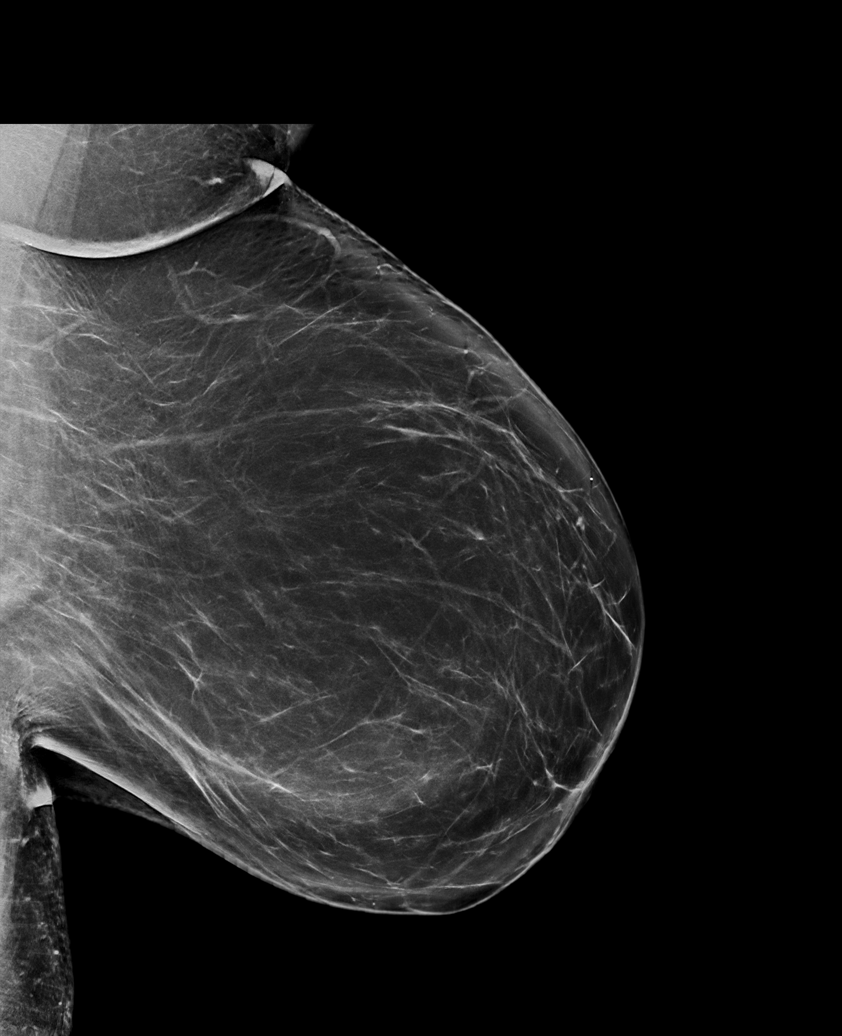

[L CC tomo · tomo slice 39/76.0]
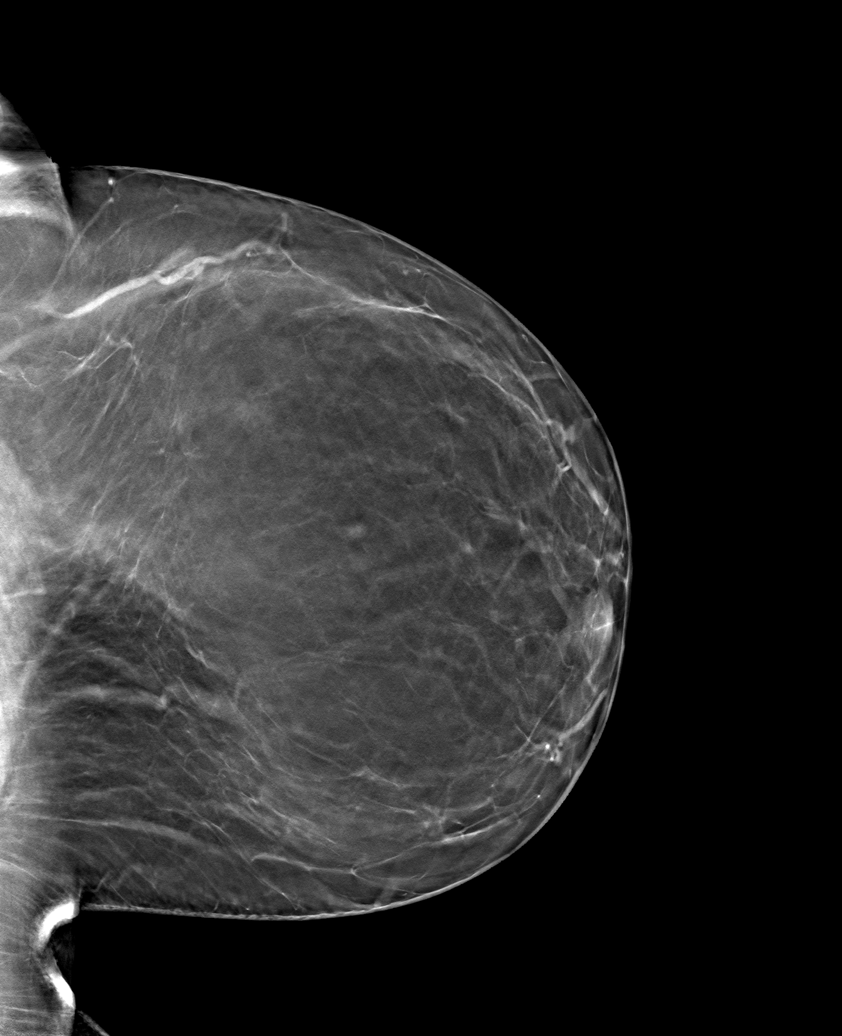

[6 of 30 positions shown; findings below may reference images not displayed]

ACR Breast Density Category b: There are scattered areas of
fibroglandular density.
FINDINGS: In the right breast, calcifications warrant further evaluation with
magnified views. In the left breast, no findings suspicious for
malignancy.
IMPRESSION: Further evaluation is suggested for calcifications in the right
breast.

RECOMMENDATION:
Diagnostic mammogram of the right breast. (Code:[id number])

The patient will be contacted regarding the findings, and additional
imaging will be scheduled.

BI-RADS CATEGORY  0: Incomplete. Need additional imaging evaluation
and/or prior mammograms for comparison.

## 2023-02-20 ENCOUNTER — Other Ambulatory Visit: Payer: Self-pay | Admitting: Family Medicine

## 2023-02-20 DIAGNOSIS — Z1231 Encounter for screening mammogram for malignant neoplasm of breast: Secondary | ICD-10-CM

## 2023-03-27 ENCOUNTER — Ambulatory Visit (INDEPENDENT_AMBULATORY_CARE_PROVIDER_SITE_OTHER): Payer: No Typology Code available for payment source | Admitting: Podiatry

## 2023-03-27 ENCOUNTER — Ambulatory Visit (INDEPENDENT_AMBULATORY_CARE_PROVIDER_SITE_OTHER): Payer: No Typology Code available for payment source

## 2023-03-27 ENCOUNTER — Encounter: Payer: Self-pay | Admitting: Podiatry

## 2023-03-27 DIAGNOSIS — M778 Other enthesopathies, not elsewhere classified: Secondary | ICD-10-CM | POA: Diagnosis not present

## 2023-03-27 DIAGNOSIS — M2021 Hallux rigidus, right foot: Secondary | ICD-10-CM

## 2023-03-27 DIAGNOSIS — M7751 Other enthesopathy of right foot: Secondary | ICD-10-CM | POA: Diagnosis not present

## 2023-03-27 NOTE — Progress Notes (Unsigned)
Chief Complaint  Patient presents with   Foot Pain    Right joint pain at big toe unable to wear dress shoes too painful for about 1 month   HPI: 57 y.o. female presents today with concern of pain to the right great toe joint.  She states she also has pain with the shoe gear pressing on the area of the great toe joint.  She indicated she does have to wear dress shoes when traveling for work.  Denies injury.  Denies bruising.  States that sometimes it will look a little swollen upon removal of irritating shoes.  Past Medical History:  Diagnosis Date   Abnormal Pap smear    Bulging lumbar disc    L4, l5--scheduled for surgery 05-04-18--Dr. Elsner   Hidradenitis    Hyperlipidemia    Infertility, female    no method of birth from late 20's through the 40's,  Headaches on OCP   Migraine    with aura   Sleep apnea 2021    Past Surgical History:  Procedure Laterality Date   ABDOMINAL EXPOSURE N/A 05/04/2018   Procedure: ABDOMINAL EXPOSURE;  Surgeon: Cephus Shelling, MD;  Location: [facility] OR;  Service: Vascular;  Laterality: N/A;   ANKLE SURGERY  1998/1999   left ankle fracture with ORIF   ANTERIOR LUMBAR FUSION N/A 05/04/2018   Procedure: Lumbar Four-Five Anterior  lumbar interbody fusion;  Surgeon: Barnett Abu, MD;  Location: [facility] OR;  Service: Neurosurgery;  Laterality: N/A;  anterior   CARPAL TUNNEL RELEASE  05/21/2012   right wrist   COLPOSCOPY     CIN I   HIP SURGERY Left 03/06/2013   --[facility] - Torn Labrum from injury   LASIK     MOLE REMOVAL  05/16/1998   off back - benign   right labrum repair Right 10/28/2021    Allergies  Allergen Reactions   Etodolac Hives, Other (See Comments) and Rash   Iodine Other (See Comments)   Rizatriptan     Other Reaction(s): did not work    Review of Systems  Musculoskeletal:  Positive for joint pain.     Physical Exam: General: The patient is alert and oriented x3 in no acute distress.  Dermatology: Skin is warm, dry  and supple bilateral lower extremities. Interspaces are clear of maceration and debris.    Vascular: Palpable pedal pulses bilaterally. Capillary refill within normal limits.  No appreciable edema.  No erythema or calor to the right first MPJ.  Neurological: Light touch sensation grossly intact bilateral feet.   Musculoskeletal Exam: There is decreased range of motion of the right first metatarsophalangeal joint.  Small amount of clicking was noted with plantarflexion of the joint.  Pain on palpation of the dorsal and dorsal medial prominence at the first metatarsal head.  Radiographic Exam (right foot, 3 weightbearing views, 03/27/2023):  Normal osseous mineralization.  There is uneven joint space narrowing at the first metatarsophalangeal joint with squaring of the first metatarsal head.  Moderate dorsal spur at the first metatarsal head noted.  Assessment/Plan of Care: 1. Bone spur of right foot   2. Capsulitis of foot   3. Hallux rigidus of right foot    Discussed clinical and radiographic findings with patient today.  Discussed the possible etiologies of hallux limitus/hallux rigidus and bone spur formation.  Discussed conservative options for treatment, including shoe modification, tight shoe avoidance, anti-inflammatories both oral and topical, cortisone injection.  Patient states that since she is now aware  this will not resolve on its own (spur go away) and can flareup in the future, she would like to proceed with surgical intervention before the end of the year.    Since there does appear to be some adequate cartilage remaining, recommend cheilectomy of the first MPJ.  Do not recommend joint destructive procedures or fusion of the joint at this time.  Benefits risks and possible postoperative complications were discussed with the patient.  Also discussed possible sequela if she does not proceed with any surgery which includes worsening of stiffness and pain to the joint and enlargement  of the dorsal spur.  Verbal and written consent were obtained preoperatively and she met with our surgical coordinator to see about getting this scheduled prior to the end of this year.  We also had discussed the postoperative course which would include 3 weeks in a surgical shoe.  This will be an outpatient surgery performed under intravenous sedation at [facility] surgery center.  Clerance Lav, DPM, FACFAS [facility]     [address] [place], [place] [address]                Office [phone]  Fax [phone]

## 2023-04-10 ENCOUNTER — Telehealth: Payer: Self-pay | Admitting: Podiatry

## 2023-04-10 NOTE — Telephone Encounter (Signed)
DOS- 05/02/2023  CHEILECTOMY RIGHT- [id number]  [employer] EFFECTIVE DATE- 05/16/2018  DEDUCTIBLE- $750.00 WITH REMAINING $0.00 OOP- $3000.00 WITH REMAINING $529.23    SPOKE WITH ALAYSIA M. FROM [employer] AND SHE STATED THAT PRIOR AUTH IS NOT REQUIRED FOR CPT CODE 46962.  CALL REFERENCE NUMBER: ALAYSIA M. 04/10/2023 @ 9:07 MST

## 2023-04-11 ENCOUNTER — Telehealth: Payer: Self-pay | Admitting: Urology

## 2023-04-11 NOTE — Telephone Encounter (Signed)
Pt called stating she needed to cxl her sx on 12/17 due to her mom being sick and caring for her. Told pt just to call back when she wanted to reschedule her sx. I have informed Aram Beecham with [facility] and Dr. Burna Mortimer of this change.

## 2023-05-02 ENCOUNTER — Telehealth: Payer: Self-pay | Admitting: Podiatry

## 2023-05-02 NOTE — Telephone Encounter (Signed)
DOS-06/06/23  CHEILECTOMY RIGHT- [id number]  [employer] EFFECTIVE DATE- 05/16/2018  DEDUCTIBLE- $750.00 WITH REMAINING $0.00 OOP-$3000.00 WITH REMAINING $354.71  COINSURANCE- 40%  SPOKE WITH LASONIA S FROM [employer] AND SHE STATED THAT PRIOR AUTH IS NOT REQUIRED FOR CPT CODE 40981.  CALL REFERENCE NUMBER: Nigel Bridgeman 05/02/23 @ 12:52 MST

## 2023-05-08 ENCOUNTER — Encounter: Payer: No Typology Code available for payment source | Admitting: Podiatry

## 2023-05-15 ENCOUNTER — Encounter: Payer: No Typology Code available for payment source | Admitting: Podiatry

## 2023-05-29 ENCOUNTER — Encounter: Payer: No Typology Code available for payment source | Admitting: Podiatry

## 2023-06-05 ENCOUNTER — Other Ambulatory Visit: Payer: Self-pay | Admitting: Podiatry

## 2023-06-05 MED ORDER — AMOXICILLIN 500 MG PO CAPS: 500.0000 mg | ORAL_CAPSULE | Freq: Two times a day (BID) | ORAL | 0 refills | Status: AC

## 2023-06-05 MED ORDER — HYDROCODONE-ACETAMINOPHEN 5-325 MG PO TABS: ORAL_TABLET | ORAL | 0 refills | Status: DC

## 2023-06-05 NOTE — Progress Notes (Signed)
Post op medications sent to patient's primary pharmacy on file for tomorrow's foot surgery

## 2023-06-06 DIAGNOSIS — M25774 Osteophyte, right foot: Secondary | ICD-10-CM | POA: Diagnosis not present

## 2023-06-06 DIAGNOSIS — M2021 Hallux rigidus, right foot: Secondary | ICD-10-CM | POA: Diagnosis not present

## 2023-06-13 ENCOUNTER — Ambulatory Visit: Payer: No Typology Code available for payment source

## 2023-06-13 ENCOUNTER — Ambulatory Visit (INDEPENDENT_AMBULATORY_CARE_PROVIDER_SITE_OTHER): Payer: No Typology Code available for payment source

## 2023-06-13 ENCOUNTER — Ambulatory Visit (INDEPENDENT_AMBULATORY_CARE_PROVIDER_SITE_OTHER): Payer: No Typology Code available for payment source | Admitting: Podiatry

## 2023-06-13 DIAGNOSIS — M2021 Hallux rigidus, right foot: Secondary | ICD-10-CM

## 2023-06-13 DIAGNOSIS — Z9889 Other specified postprocedural states: Secondary | ICD-10-CM

## 2023-06-13 NOTE — Progress Notes (Signed)
   HPI: 58 y.o. female presents today for her first postoperative appointment today.  She denies fever, chills, nausea/vomiting, chest pain.  States that she has been able to keep the dressing clean dry and intact.  Has mild discomfort to the area but has mostly been able to manage this with Tylenol.  She has taken a few of the Norco tablets over the past week for pain management.  Past Medical History:  Diagnosis Date   Abnormal Pap smear    Bulging lumbar disc    L4, l5--scheduled for surgery 05-04-18--Dr. Elsner   Hidradenitis    Hyperlipidemia    Infertility, female    no method of birth from late 20's through the 40's,  Headaches on OCP   Migraine    with aura   Sleep apnea 2021    Past Surgical History:  Procedure Laterality Date   ABDOMINAL EXPOSURE N/A 05/04/2018   Procedure: ABDOMINAL EXPOSURE;  Surgeon: Cephus Shelling, MD;  Location: [facility] OR;  Service: Vascular;  Laterality: N/A;   ANKLE SURGERY  1998/1999   left ankle fracture with ORIF   ANTERIOR LUMBAR FUSION N/A 05/04/2018   Procedure: Lumbar Four-Five Anterior  lumbar interbody fusion;  Surgeon: Barnett Abu, MD;  Location[facility] OR;  Service: Neurosurgery;  Laterality: N/A;  anterior   CARPAL TUNNEL RELEASE  05/21/2012   right wrist   COLPOSCOPY     CIN I   HIP SURGERY Left 03/06/2013   --[facility] - Torn Labrum from injury   LASIK     MOLE REMOVAL  05/16/1998   off back - benign   right labrum repair Right 10/28/2021    Allergies  Allergen Reactions   Etodolac Hives, Other (See Comments) and Rash   Iodine Other (See Comments)   Rizatriptan     Other Reaction(s): did not work   Physical Exam: Palpable pedal pulses to the right foot.  Localized edema to the surgical area.  The incision is well-approximated.  Sutures are intact and Steri-Strips are in place.  No clinical signs of infection are noted.  No active drainage is noted.  Minimal pain on palpation to the surgical area.  The toe is in rectus  position.  Radiographic Exam (right foot, 3 weightbearing views, 06/13/2023):  Normal osseous mineralization.  Evidence of the exostectomy to the bone spur on the dorsal aspect of the first metatarsal is noted.  No fractures  Assessment/Plan of Care: 1. Hallux rigidus of right foot   2. Post-operative state     Antibiotic ointment and a dry sterile dressing was applied to the right foot followed by an Ace wrap.  She will continue with the surgical shoe for 1 more week.  She will continue with ice and elevation until next appointment.  Will reassess ability to return to shoes and resume activities after next appointment.   Clerance Lav, DPM, FACFAS [facility]     [address] [place], [place] [address]                Office [phone]  Fax [phone]

## 2023-06-20 ENCOUNTER — Encounter: Payer: Self-pay | Admitting: *Deleted

## 2023-06-20 ENCOUNTER — Ambulatory Visit (INDEPENDENT_AMBULATORY_CARE_PROVIDER_SITE_OTHER): Payer: No Typology Code available for payment source | Admitting: Podiatry

## 2023-06-20 DIAGNOSIS — Z809 Family history of malignant neoplasm, unspecified: Secondary | ICD-10-CM | POA: Insufficient documentation

## 2023-06-20 DIAGNOSIS — K5904 Chronic idiopathic constipation: Secondary | ICD-10-CM | POA: Insufficient documentation

## 2023-06-20 DIAGNOSIS — Z1211 Encounter for screening for malignant neoplasm of colon: Secondary | ICD-10-CM | POA: Insufficient documentation

## 2023-06-20 DIAGNOSIS — G8929 Other chronic pain: Secondary | ICD-10-CM | POA: Insufficient documentation

## 2023-06-20 DIAGNOSIS — R140 Abdominal distension (gaseous): Secondary | ICD-10-CM | POA: Insufficient documentation

## 2023-06-20 DIAGNOSIS — K219 Gastro-esophageal reflux disease without esophagitis: Secondary | ICD-10-CM | POA: Insufficient documentation

## 2023-06-20 DIAGNOSIS — Z9889 Other specified postprocedural states: Secondary | ICD-10-CM

## 2023-06-20 DIAGNOSIS — Z860100 Personal history of colon polyps, unspecified: Secondary | ICD-10-CM | POA: Insufficient documentation

## 2023-06-20 NOTE — Progress Notes (Signed)
 Chief Complaint  Patient presents with   Post-op Follow-up    RT cheilectomy. Some pain, 0.5/10, aching like a bone pain. Denies signs of infection. Steristrips in place. Increased pain with trying to bend the toe, but she does not try to do that on purpose. Her husband, Norleen, is with her today.    HPI: 58 y.o. female presents today for her 2nd p/o appt.  Her husband is with her today.  She got notified by her employer that she has to fly to [place] within the next few days for a work trip.  She only has occasional pain to the area if the toe moves while she is sleeping.    Past Medical History:  Diagnosis Date   Abnormal Pap smear    Bulging lumbar disc    L4, l5--scheduled for surgery 05-04-18--Dr. Elsner   Hidradenitis    Hyperlipidemia    Infertility, female    no method of birth from late 20's through the 40's,  Headaches on OCP   Migraine    with aura   Sleep apnea 2021    Past Surgical History:  Procedure Laterality Date   ABDOMINAL EXPOSURE N/A 05/04/2018   Procedure: ABDOMINAL EXPOSURE;  Surgeon: Gretta Lonni PARAS, MD;  Location: [facility] OR;  Service: Vascular;  Laterality: N/A;   ANKLE SURGERY  1998/1999   left ankle fracture with ORIF   ANTERIOR LUMBAR FUSION N/A 05/04/2018   Procedure: Lumbar Four-Five Anterior  lumbar interbody fusion;  Surgeon: Colon Shove, MD;  Location: [facility] OR;  Service: Neurosurgery;  Laterality: N/A;  anterior   CARPAL TUNNEL RELEASE  05/21/2012   right wrist   COLPOSCOPY     CIN I   HIP SURGERY Left 03/06/2013   --[facility] - Torn Labrum from injury   LASIK     MOLE REMOVAL  05/16/1998   off back - benign   right labrum repair Right 10/28/2021    Allergies  Allergen Reactions   Etodolac Hives, Other (See Comments) and Rash   Iodine Other (See Comments)   Rizatriptan     Other Reaction(s): did not work   Physical Exam: There are palpable pedal pulses right foot.  Incision is well coapted.  Steri-Strips are in place.  There is  minimal localized edema to the surgical area.  No clinical signs of infection are noted.  No active drainage is noted.  Mild discomfort with range of motion of the first MPJ.  Epicritic sensation is intact  Assessment/Plan of Care: 1. Post-operative state     Discussed clinical findings with patient today.  The Steri-Strips were removed and the sutures were excised and removed.  New Steri-Strips and antibiotic ointment were applied across the incision.  Right gauze dressing was applied.  She will wear this until tomorrow.  Then she can remove it to start showering and dry dressing as needed.  Recommended compression knee-high stockings during her travel and advised patient on the location of the foot and using ice packs on her foot.  Follow-up 2 weeks   Nathanuel Cabreja DSABRA Imperial, DPM, FACFAS [facility]     [address], [place] [address]                Office [phone]  Fax [phone]

## 2023-07-03 ENCOUNTER — Ambulatory Visit: Payer: No Typology Code available for payment source

## 2023-07-04 ENCOUNTER — Ambulatory Visit
Admission: RE | Admit: 2023-07-04 | Discharge: 2023-07-04 | Disposition: A | Discharge status: Home / Self Care | Payer: No Typology Code available for payment source | Source: Ambulatory Visit | Attending: Family Medicine | Admitting: Family Medicine

## 2023-07-04 ENCOUNTER — Ambulatory Visit (INDEPENDENT_AMBULATORY_CARE_PROVIDER_SITE_OTHER): Payer: No Typology Code available for payment source | Admitting: Podiatry

## 2023-07-04 DIAGNOSIS — Z9889 Other specified postprocedural states: Secondary | ICD-10-CM

## 2023-07-04 DIAGNOSIS — Z1231 Encounter for screening mammogram for malignant neoplasm of breast: Secondary | ICD-10-CM

## 2023-07-04 DIAGNOSIS — M2021 Hallux rigidus, right foot: Secondary | ICD-10-CM

## 2023-07-04 NOTE — Progress Notes (Unsigned)
   Chief Complaint  Patient presents with   Routine Post Op    RT cheilectomy, Denies signs of infection. Pt stated she is doing ok gets pain sometimes when she is on her foot for a while but overall she is getting better she said.      HPI: 58 y.o. female presents today for postop from a right first MPJ cheilectomy.  Patient states that she did go on her work trip recently and noticed some swelling and discomfort but overall was very pleased with how well she was able to walk around without any significant pain.  As she is pleased overall with her surgical outcome.  Past Medical History:  Diagnosis Date   Abnormal Pap smear    Bulging lumbar disc    L4, l5--scheduled for surgery 05-04-18--Dr. Elsner   Hidradenitis    Hyperlipidemia    Infertility, female    no method of birth from late 20's through the 40's,  Headaches on OCP   Migraine    with aura   Sleep apnea 2021    Past Surgical History:  Procedure Laterality Date   ABDOMINAL EXPOSURE N/A 05/04/2018   Procedure: ABDOMINAL EXPOSURE;  Surgeon: Cephus Shelling, MD;  Location: [facility] OR;  Service: Vascular;  Laterality: N/A;   ANKLE SURGERY  1998/1999   left ankle fracture with ORIF   ANTERIOR LUMBAR FUSION N/A 05/04/2018   Procedure: Lumbar Four-Five Anterior  lumbar interbody fusion;  Surgeon: Barnett Abu, MD;  Location: [facility] OR;  Service: Neurosurgery;  Laterality: N/A;  anterior   CARPAL TUNNEL RELEASE  05/21/2012   right wrist   COLPOSCOPY     CIN I   HIP SURGERY Left 03/06/2013   --[facility] - Torn Labrum from injury   LASIK     MOLE REMOVAL  05/16/1998   off back - benign   right labrum repair Right 10/28/2021    Allergies  Allergen Reactions   Etodolac Hives, Other (See Comments) and Rash   Iodine Other (See Comments)   Rizatriptan     Other Reaction(s): did not work    ROS    Physical Exam: There were no vitals filed for this visit.  ***   Radiographic Exam:  Normal osseous mineralization.  Joint spaces preserved.  No fractures or osseous irregularities noted.  Assessment/Plan of Care: 1. Post-operative state   2. Hallux rigidus of right foot      No orders of the defined types were placed in this encounter.  None  Discussed clinical findings with patient today.  ***   Kinsleigh Ludolph Orland Mustard, DPM, FACFAS [facility]     [address] [place], [place] [address]                Office [phone]  Fax [phone]

## 2023-07-06 ENCOUNTER — Ambulatory Visit: Payer: No Typology Code available for payment source

## 2024-02-13 NOTE — Telephone Encounter (Signed)
 Patient hasn't seen mychart message and now DME has reached out again asking for cpap supply refills. Please call patient and offer follow-up appointment. We need to see her at least once a year in order to provide cpap supply refills.

## 2024-02-13 NOTE — Telephone Encounter (Signed)
 thanks

## 2024-02-23 ENCOUNTER — Other Ambulatory Visit: Payer: Self-pay | Admitting: Obstetrics and Gynecology

## 2024-02-23 DIAGNOSIS — Z1231 Encounter for screening mammogram for malignant neoplasm of breast: Secondary | ICD-10-CM

## 2024-03-05 ENCOUNTER — Ambulatory Visit
Admission: RE | Admit: 2024-03-05 | Discharge: 2024-03-05 | Disposition: A | Discharge status: Home / Self Care | Source: Ambulatory Visit | Attending: Obstetrics and Gynecology | Admitting: Obstetrics and Gynecology

## 2024-03-05 DIAGNOSIS — Z1231 Encounter for screening mammogram for malignant neoplasm of breast: Secondary | ICD-10-CM

## 2024-03-21 NOTE — Progress Notes (Addendum)
 PATIENT: Kelsey [name] DOB: 1965-11-26  REASON FOR VISIT: follow up HISTORY FROM: patient  Virtual Visit via Telephone Note  I connected with Kelsey [name] on 03/29/24 at  8:30 AM EST by telephone and verified that I am speaking with the correct person using two identifiers.   I discussed the limitations, risks, security and privacy concerns of performing an evaluation and management service by telephone and the availability of in person appointments. I also discussed with the patient that there may be a patient responsible charge related to this service. The patient expressed understanding and agreed to proceed.   History of Present Illness:  03/29/24 ALL: Kelsey [name] returns for follow up for OSA on CPAP. She reports doing well on therapy. She is using CPAP most every night for about 8 hours, on average. She does endorse better sleep quality on therapy. She travels for work and does not normally take her machine. She can tell a difference in how she feels when not using therapy. She denies concerns with machine or supplies.     07/11/2022 ALL:  Kelsey [name] is a 58 y.o. female here today for follow up for OSA on CPAP. She continues to do well. She does note benefit of using CPAP. She has traveled more over the past few months and feels more tired when not using her CPAP. She denies concerns with machine or supplies.      History (copied from Dr Obie previous note)  Kelsey [name] is a 58 year old right-handed woman with an underlying medical history of degenerative lumbar spine disease with status post surgery in 2019, hyperlipidemia, migraine headaches, and obesity, who presents for her yearly follow-up of her sleep apnea, on AutoPap therapy.  The patient is unaccompanied today. I last saw her on 06/24/2020, at which time she was fully compliant with AutoPap and reported feeling better.  She no longer had sensations of trembling or vibration inside.  I suggested we pursue an ONO to make sure  her oxygen saturations are adequate while on AutoPap therapy.  She had a pulse ox overnight on 09/03/2020 which indicated good oxygen saturations overnight while on AutoPap therapy.   Today, 06/24/21: I reviewed her compliance data on her phone app.  She has an average usage of 8+ hours typically and has been consistent with her usage.  Average AHI right around 1/h, average pressure right around 8 cm.  Unfortunately, we were not able to get an actual download from her machine despite her bring the machine with the SD card and the power cord, we tried a different SD card from our office as well which did not transfer the data. She reports doing well with regard to her OSA, compliant with the autoPAP. She has had LBP and went through PT, then started having issues with her L hip, which was operated on some years ago. She then started having R hip pain. She is seeing ortho, Dr. Hiram and his PA and is scheduled to get a steroid injection in about 2 weeks. Her weight had gone down, then back up, due to inability to exercise. She has been started on generic Lipitor recently per PCP.  She has used a cane as needed.  She has had intermittent cramping sensation in her lower body, similar to the vibration/trembling she has experienced before, but nothing sustained or alarming or progressive.     Observations/Objective:  Generalized: Well developed, in no acute distress  Mentation: Alert oriented to time, place, history taking. Follows  all commands speech and language fluent   Assessment and Plan:  58 y.o. year old female  has a past medical history of Abnormal Pap smear, Bulging lumbar disc, Hidradenitis, Hyperlipidemia, Infertility, female, Migraine, and Sleep apnea (2021). here with    ICD-10-CM   1. OSA on CPAP  G47.33 For home use only DME continuous positive airway pressure (CPAP)       Kelsey [name] is doing well on CPAP. Compliance report shows acceptable usage. I have encourage her to use CPAP nightly for  at least 4 hours. Consider travel machine if needed. Healthy lifestyle habits encouraged. She will follow up with me in 1 year, sooner if needed.   Orders Placed This Encounter  Procedures   For home use only DME continuous positive airway pressure (CPAP)    Heated Humidity with all supplies as needed    Length of Need:   Lifetime    Patient has OSA or probable OSA:   Yes    Is the patient currently using CPAP in the home:   Yes    Settings:   Other see comments    CPAP supplies needed:   Mask, headgear, cushions, filters, heated tubing and water chamber    No orders of the defined types were placed in this encounter.    Follow Up Instructions:  I discussed the assessment and treatment plan with the patient. The patient was provided an opportunity to ask questions and all were answered. The patient agreed with the plan and demonstrated an understanding of the instructions.   The patient was advised to call back or seek an in-person evaluation if the symptoms worsen or if the condition fails to improve as anticipated.  I provided 15 minutes of non-face-to-face time during this encounter. Patient located at their place of residence during Mychart visit. Provider is in the office.    Leonce Bale, NP

## 2024-03-21 NOTE — Patient Instructions (Addendum)
 Please continue using your CPAP regularly. While your insurance requires that you use CPAP at least 4 hours each night on 70% of the nights, I recommend, that you not skip any nights and use it throughout the night if you can. Getting used to CPAP and staying with the treatment long term does take time and patience and discipline. Untreated obstructive sleep apnea when it is moderate to severe can have an adverse impact on cardiovascular health and raise her risk for heart disease, arrhythmias, hypertension, congestive heart failure, stroke and diabetes. Untreated obstructive sleep apnea causes sleep disruption, nonrestorative sleep, and sleep deprivation. This can have an impact on your day to day functioning and cause daytime sleepiness and impairment of cognitive function, memory loss, mood disturbance, and problems focussing. Using CPAP regularly can improve these symptoms.  We will update supply orders, today. Your compliance report looks great. Apnea is very well managed.   Follow up in 1 year

## 2024-03-29 ENCOUNTER — Telehealth: Admitting: Family Medicine

## 2024-03-29 ENCOUNTER — Encounter: Payer: Self-pay | Admitting: Family Medicine

## 2024-03-29 DIAGNOSIS — G4733 Obstructive sleep apnea (adult) (pediatric): Secondary | ICD-10-CM

## 2024-07-08 ENCOUNTER — Ambulatory Visit

## 2025-04-14 ENCOUNTER — Telehealth: Admitting: Family Medicine
